# Patient Record
Sex: Male | Born: 1940 | Race: White | Hispanic: No | Marital: Married | Smoking: Former smoker
Health system: Southern US, Community
[De-identification: ages and names within clinical notes are randomized; demographics above are authoritative.]

## PROBLEM LIST (undated history)

## (undated) DIAGNOSIS — J45909 Unspecified asthma, uncomplicated: Secondary | ICD-10-CM

## (undated) DIAGNOSIS — E785 Hyperlipidemia, unspecified: Secondary | ICD-10-CM

## (undated) DIAGNOSIS — I2510 Atherosclerotic heart disease of native coronary artery without angina pectoris: Secondary | ICD-10-CM

## (undated) DIAGNOSIS — I10 Essential (primary) hypertension: Secondary | ICD-10-CM

## (undated) DIAGNOSIS — I4891 Unspecified atrial fibrillation: Secondary | ICD-10-CM

## (undated) HISTORY — DX: Unspecified atrial fibrillation: I48.91

## (undated) HISTORY — PX: OTHER SURGICAL HISTORY: SHX169

## (undated) HISTORY — DX: Hyperlipidemia, unspecified: E78.5

## (undated) HISTORY — PX: CORONARY ARTERY BYPASS GRAFT: SHX141

## (undated) HISTORY — PX: TONSILLECTOMY: SUR1361

## (undated) HISTORY — PX: CYSTOSCOPY: SUR368

## (undated) HISTORY — PX: HERNIA REPAIR: SHX51

---

## 2004-02-06 ENCOUNTER — Other Ambulatory Visit: Payer: Self-pay

## 2004-10-28 ENCOUNTER — Ambulatory Visit: Payer: Self-pay | Admitting: Unknown Physician Specialty

## 2005-08-16 ENCOUNTER — Ambulatory Visit: Payer: Self-pay | Admitting: Internal Medicine

## 2006-02-22 ENCOUNTER — Ambulatory Visit: Payer: Self-pay | Admitting: Unknown Physician Specialty

## 2007-04-11 ENCOUNTER — Ambulatory Visit: Payer: Self-pay | Admitting: Family Medicine

## 2007-04-24 ENCOUNTER — Ambulatory Visit: Payer: Self-pay | Admitting: Internal Medicine

## 2007-07-23 ENCOUNTER — Other Ambulatory Visit: Payer: Self-pay

## 2007-07-23 ENCOUNTER — Emergency Department: Payer: Self-pay | Admitting: Emergency Medicine

## 2007-08-24 ENCOUNTER — Ambulatory Visit: Payer: Self-pay | Admitting: Internal Medicine

## 2007-10-08 ENCOUNTER — Ambulatory Visit: Payer: Self-pay | Admitting: Internal Medicine

## 2007-10-17 ENCOUNTER — Ambulatory Visit: Payer: Self-pay | Admitting: Vascular Surgery

## 2007-10-31 ENCOUNTER — Encounter: Payer: Self-pay | Admitting: Internal Medicine

## 2007-11-11 ENCOUNTER — Encounter: Payer: Self-pay | Admitting: Internal Medicine

## 2007-12-12 ENCOUNTER — Encounter: Payer: Self-pay | Admitting: Internal Medicine

## 2008-10-30 ENCOUNTER — Ambulatory Visit: Payer: Self-pay | Admitting: Internal Medicine

## 2010-10-04 ENCOUNTER — Ambulatory Visit: Payer: Self-pay | Admitting: Internal Medicine

## 2011-05-19 ENCOUNTER — Ambulatory Visit: Payer: Self-pay

## 2011-06-07 ENCOUNTER — Ambulatory Visit: Payer: Self-pay

## 2011-08-02 ENCOUNTER — Ambulatory Visit: Payer: Self-pay | Admitting: Unknown Physician Specialty

## 2011-10-13 DIAGNOSIS — I10 Essential (primary) hypertension: Secondary | ICD-10-CM | POA: Diagnosis not present

## 2011-10-13 DIAGNOSIS — I2510 Atherosclerotic heart disease of native coronary artery without angina pectoris: Secondary | ICD-10-CM | POA: Diagnosis not present

## 2011-10-13 DIAGNOSIS — L989 Disorder of the skin and subcutaneous tissue, unspecified: Secondary | ICD-10-CM | POA: Diagnosis not present

## 2011-10-13 DIAGNOSIS — E785 Hyperlipidemia, unspecified: Secondary | ICD-10-CM | POA: Diagnosis not present

## 2011-10-13 DIAGNOSIS — L738 Other specified follicular disorders: Secondary | ICD-10-CM | POA: Diagnosis not present

## 2011-10-13 DIAGNOSIS — I471 Supraventricular tachycardia: Secondary | ICD-10-CM | POA: Diagnosis not present

## 2011-10-28 DIAGNOSIS — J329 Chronic sinusitis, unspecified: Secondary | ICD-10-CM | POA: Diagnosis not present

## 2011-10-28 DIAGNOSIS — E782 Mixed hyperlipidemia: Secondary | ICD-10-CM | POA: Diagnosis not present

## 2011-10-28 DIAGNOSIS — J3089 Other allergic rhinitis: Secondary | ICD-10-CM | POA: Diagnosis not present

## 2011-10-28 DIAGNOSIS — K219 Gastro-esophageal reflux disease without esophagitis: Secondary | ICD-10-CM | POA: Diagnosis not present

## 2011-10-28 DIAGNOSIS — J45909 Unspecified asthma, uncomplicated: Secondary | ICD-10-CM | POA: Diagnosis not present

## 2012-01-02 DIAGNOSIS — H35379 Puckering of macula, unspecified eye: Secondary | ICD-10-CM | POA: Diagnosis not present

## 2012-01-02 DIAGNOSIS — H43819 Vitreous degeneration, unspecified eye: Secondary | ICD-10-CM | POA: Diagnosis not present

## 2012-01-10 DIAGNOSIS — Z0000 Encounter for general adult medical examination without abnormal findings: Secondary | ICD-10-CM | POA: Diagnosis not present

## 2012-01-10 DIAGNOSIS — Z125 Encounter for screening for malignant neoplasm of prostate: Secondary | ICD-10-CM | POA: Diagnosis not present

## 2012-01-10 DIAGNOSIS — E782 Mixed hyperlipidemia: Secondary | ICD-10-CM | POA: Diagnosis not present

## 2012-01-10 DIAGNOSIS — I10 Essential (primary) hypertension: Secondary | ICD-10-CM | POA: Diagnosis not present

## 2012-01-26 DIAGNOSIS — K219 Gastro-esophageal reflux disease without esophagitis: Secondary | ICD-10-CM | POA: Diagnosis not present

## 2012-01-26 DIAGNOSIS — R5383 Other fatigue: Secondary | ICD-10-CM | POA: Diagnosis not present

## 2012-01-26 DIAGNOSIS — J3089 Other allergic rhinitis: Secondary | ICD-10-CM | POA: Diagnosis not present

## 2012-01-26 DIAGNOSIS — J45901 Unspecified asthma with (acute) exacerbation: Secondary | ICD-10-CM | POA: Diagnosis not present

## 2012-01-26 DIAGNOSIS — J019 Acute sinusitis, unspecified: Secondary | ICD-10-CM | POA: Diagnosis not present

## 2012-01-26 DIAGNOSIS — R5381 Other malaise: Secondary | ICD-10-CM | POA: Diagnosis not present

## 2012-01-26 DIAGNOSIS — J309 Allergic rhinitis, unspecified: Secondary | ICD-10-CM | POA: Diagnosis not present

## 2012-02-23 DIAGNOSIS — K219 Gastro-esophageal reflux disease without esophagitis: Secondary | ICD-10-CM | POA: Diagnosis not present

## 2012-02-23 DIAGNOSIS — J309 Allergic rhinitis, unspecified: Secondary | ICD-10-CM | POA: Diagnosis not present

## 2012-02-23 DIAGNOSIS — E782 Mixed hyperlipidemia: Secondary | ICD-10-CM | POA: Diagnosis not present

## 2012-02-23 DIAGNOSIS — J45909 Unspecified asthma, uncomplicated: Secondary | ICD-10-CM | POA: Diagnosis not present

## 2012-02-23 DIAGNOSIS — I2510 Atherosclerotic heart disease of native coronary artery without angina pectoris: Secondary | ICD-10-CM | POA: Diagnosis not present

## 2012-02-23 DIAGNOSIS — L679 Hair color and hair shaft abnormality, unspecified: Secondary | ICD-10-CM | POA: Diagnosis not present

## 2012-02-28 DIAGNOSIS — L0212 Furuncle of neck: Secondary | ICD-10-CM | POA: Diagnosis not present

## 2012-02-28 DIAGNOSIS — L0202 Furuncle of face: Secondary | ICD-10-CM | POA: Diagnosis not present

## 2012-03-13 DIAGNOSIS — L0212 Furuncle of neck: Secondary | ICD-10-CM | POA: Diagnosis not present

## 2012-03-13 DIAGNOSIS — L0213 Carbuncle of neck: Secondary | ICD-10-CM | POA: Diagnosis not present

## 2012-04-10 DIAGNOSIS — E782 Mixed hyperlipidemia: Secondary | ICD-10-CM | POA: Diagnosis not present

## 2012-04-10 DIAGNOSIS — I119 Hypertensive heart disease without heart failure: Secondary | ICD-10-CM | POA: Diagnosis not present

## 2012-04-10 DIAGNOSIS — I2510 Atherosclerotic heart disease of native coronary artery without angina pectoris: Secondary | ICD-10-CM | POA: Diagnosis not present

## 2012-05-24 DIAGNOSIS — J019 Acute sinusitis, unspecified: Secondary | ICD-10-CM | POA: Diagnosis not present

## 2012-05-24 DIAGNOSIS — J309 Allergic rhinitis, unspecified: Secondary | ICD-10-CM | POA: Diagnosis not present

## 2012-06-04 DIAGNOSIS — Z23 Encounter for immunization: Secondary | ICD-10-CM | POA: Diagnosis not present

## 2012-06-25 DIAGNOSIS — J329 Chronic sinusitis, unspecified: Secondary | ICD-10-CM | POA: Diagnosis not present

## 2012-06-25 DIAGNOSIS — J45909 Unspecified asthma, uncomplicated: Secondary | ICD-10-CM | POA: Diagnosis not present

## 2012-06-25 DIAGNOSIS — J069 Acute upper respiratory infection, unspecified: Secondary | ICD-10-CM | POA: Diagnosis not present

## 2012-06-25 DIAGNOSIS — J309 Allergic rhinitis, unspecified: Secondary | ICD-10-CM | POA: Diagnosis not present

## 2012-07-02 DIAGNOSIS — J019 Acute sinusitis, unspecified: Secondary | ICD-10-CM | POA: Diagnosis not present

## 2012-07-02 DIAGNOSIS — R5381 Other malaise: Secondary | ICD-10-CM | POA: Diagnosis not present

## 2012-07-02 DIAGNOSIS — R42 Dizziness and giddiness: Secondary | ICD-10-CM | POA: Diagnosis not present

## 2012-07-02 DIAGNOSIS — J45909 Unspecified asthma, uncomplicated: Secondary | ICD-10-CM | POA: Diagnosis not present

## 2012-07-02 DIAGNOSIS — K219 Gastro-esophageal reflux disease without esophagitis: Secondary | ICD-10-CM | POA: Diagnosis not present

## 2012-07-02 DIAGNOSIS — D518 Other vitamin B12 deficiency anemias: Secondary | ICD-10-CM | POA: Diagnosis not present

## 2012-07-19 DIAGNOSIS — J45909 Unspecified asthma, uncomplicated: Secondary | ICD-10-CM | POA: Diagnosis not present

## 2012-07-19 DIAGNOSIS — R42 Dizziness and giddiness: Secondary | ICD-10-CM | POA: Diagnosis not present

## 2012-07-19 DIAGNOSIS — I959 Hypotension, unspecified: Secondary | ICD-10-CM | POA: Diagnosis not present

## 2012-07-19 DIAGNOSIS — H6690 Otitis media, unspecified, unspecified ear: Secondary | ICD-10-CM | POA: Diagnosis not present

## 2012-07-19 DIAGNOSIS — J329 Chronic sinusitis, unspecified: Secondary | ICD-10-CM | POA: Diagnosis not present

## 2012-07-25 DIAGNOSIS — H6120 Impacted cerumen, unspecified ear: Secondary | ICD-10-CM | POA: Diagnosis not present

## 2012-07-25 DIAGNOSIS — J301 Allergic rhinitis due to pollen: Secondary | ICD-10-CM | POA: Diagnosis not present

## 2012-07-25 DIAGNOSIS — H93299 Other abnormal auditory perceptions, unspecified ear: Secondary | ICD-10-CM | POA: Diagnosis not present

## 2012-10-22 DIAGNOSIS — K219 Gastro-esophageal reflux disease without esophagitis: Secondary | ICD-10-CM | POA: Diagnosis not present

## 2012-10-22 DIAGNOSIS — I2510 Atherosclerotic heart disease of native coronary artery without angina pectoris: Secondary | ICD-10-CM | POA: Diagnosis not present

## 2012-10-22 DIAGNOSIS — J3089 Other allergic rhinitis: Secondary | ICD-10-CM | POA: Diagnosis not present

## 2012-10-22 DIAGNOSIS — J45991 Cough variant asthma: Secondary | ICD-10-CM | POA: Diagnosis not present

## 2012-10-22 DIAGNOSIS — J329 Chronic sinusitis, unspecified: Secondary | ICD-10-CM | POA: Diagnosis not present

## 2012-11-02 DIAGNOSIS — I9589 Other hypotension: Secondary | ICD-10-CM | POA: Diagnosis not present

## 2012-11-02 DIAGNOSIS — I2510 Atherosclerotic heart disease of native coronary artery without angina pectoris: Secondary | ICD-10-CM | POA: Diagnosis not present

## 2013-02-22 DIAGNOSIS — Z125 Encounter for screening for malignant neoplasm of prostate: Secondary | ICD-10-CM | POA: Diagnosis not present

## 2013-02-22 DIAGNOSIS — E119 Type 2 diabetes mellitus without complications: Secondary | ICD-10-CM | POA: Diagnosis not present

## 2013-02-22 DIAGNOSIS — D518 Other vitamin B12 deficiency anemias: Secondary | ICD-10-CM | POA: Diagnosis not present

## 2013-02-22 DIAGNOSIS — E782 Mixed hyperlipidemia: Secondary | ICD-10-CM | POA: Diagnosis not present

## 2013-02-22 DIAGNOSIS — F329 Major depressive disorder, single episode, unspecified: Secondary | ICD-10-CM | POA: Diagnosis not present

## 2013-02-22 DIAGNOSIS — R634 Abnormal weight loss: Secondary | ICD-10-CM | POA: Diagnosis not present

## 2013-02-22 DIAGNOSIS — E039 Hypothyroidism, unspecified: Secondary | ICD-10-CM | POA: Diagnosis not present

## 2013-02-22 DIAGNOSIS — I10 Essential (primary) hypertension: Secondary | ICD-10-CM | POA: Diagnosis not present

## 2013-02-22 DIAGNOSIS — D643 Other sideroblastic anemias: Secondary | ICD-10-CM | POA: Diagnosis not present

## 2013-02-22 DIAGNOSIS — K219 Gastro-esophageal reflux disease without esophagitis: Secondary | ICD-10-CM | POA: Diagnosis not present

## 2013-02-22 DIAGNOSIS — R5381 Other malaise: Secondary | ICD-10-CM | POA: Diagnosis not present

## 2013-02-22 DIAGNOSIS — J45909 Unspecified asthma, uncomplicated: Secondary | ICD-10-CM | POA: Diagnosis not present

## 2013-04-04 DIAGNOSIS — I2510 Atherosclerotic heart disease of native coronary artery without angina pectoris: Secondary | ICD-10-CM | POA: Diagnosis not present

## 2013-04-04 DIAGNOSIS — J45991 Cough variant asthma: Secondary | ICD-10-CM | POA: Diagnosis not present

## 2013-04-04 DIAGNOSIS — R5381 Other malaise: Secondary | ICD-10-CM | POA: Diagnosis not present

## 2013-04-04 DIAGNOSIS — J3089 Other allergic rhinitis: Secondary | ICD-10-CM | POA: Diagnosis not present

## 2013-04-04 DIAGNOSIS — F329 Major depressive disorder, single episode, unspecified: Secondary | ICD-10-CM | POA: Diagnosis not present

## 2013-04-04 DIAGNOSIS — R634 Abnormal weight loss: Secondary | ICD-10-CM | POA: Diagnosis not present

## 2013-04-04 DIAGNOSIS — K219 Gastro-esophageal reflux disease without esophagitis: Secondary | ICD-10-CM | POA: Diagnosis not present

## 2013-04-25 DIAGNOSIS — I2510 Atherosclerotic heart disease of native coronary artery without angina pectoris: Secondary | ICD-10-CM | POA: Diagnosis not present

## 2013-04-25 DIAGNOSIS — I119 Hypertensive heart disease without heart failure: Secondary | ICD-10-CM | POA: Diagnosis not present

## 2013-04-25 DIAGNOSIS — I714 Abdominal aortic aneurysm, without rupture: Secondary | ICD-10-CM | POA: Diagnosis not present

## 2013-04-25 DIAGNOSIS — I6529 Occlusion and stenosis of unspecified carotid artery: Secondary | ICD-10-CM | POA: Diagnosis not present

## 2013-05-16 DIAGNOSIS — I6529 Occlusion and stenosis of unspecified carotid artery: Secondary | ICD-10-CM | POA: Diagnosis not present

## 2013-05-16 DIAGNOSIS — R0989 Other specified symptoms and signs involving the circulatory and respiratory systems: Secondary | ICD-10-CM | POA: Diagnosis not present

## 2013-05-23 DIAGNOSIS — M25549 Pain in joints of unspecified hand: Secondary | ICD-10-CM | POA: Diagnosis not present

## 2013-05-27 DIAGNOSIS — I2510 Atherosclerotic heart disease of native coronary artery without angina pectoris: Secondary | ICD-10-CM | POA: Diagnosis not present

## 2013-05-27 DIAGNOSIS — I6529 Occlusion and stenosis of unspecified carotid artery: Secondary | ICD-10-CM | POA: Diagnosis not present

## 2013-05-27 DIAGNOSIS — E782 Mixed hyperlipidemia: Secondary | ICD-10-CM | POA: Diagnosis not present

## 2013-05-27 DIAGNOSIS — I471 Supraventricular tachycardia: Secondary | ICD-10-CM | POA: Diagnosis not present

## 2013-06-13 DIAGNOSIS — Z23 Encounter for immunization: Secondary | ICD-10-CM | POA: Diagnosis not present

## 2013-07-10 DIAGNOSIS — L0291 Cutaneous abscess, unspecified: Secondary | ICD-10-CM | POA: Diagnosis not present

## 2013-07-12 DIAGNOSIS — L0291 Cutaneous abscess, unspecified: Secondary | ICD-10-CM | POA: Diagnosis not present

## 2013-07-17 DIAGNOSIS — L0291 Cutaneous abscess, unspecified: Secondary | ICD-10-CM | POA: Diagnosis not present

## 2013-08-06 DIAGNOSIS — R5381 Other malaise: Secondary | ICD-10-CM | POA: Diagnosis not present

## 2013-08-06 DIAGNOSIS — J309 Allergic rhinitis, unspecified: Secondary | ICD-10-CM | POA: Diagnosis not present

## 2013-08-06 DIAGNOSIS — K219 Gastro-esophageal reflux disease without esophagitis: Secondary | ICD-10-CM | POA: Diagnosis not present

## 2013-08-06 DIAGNOSIS — IMO0002 Reserved for concepts with insufficient information to code with codable children: Secondary | ICD-10-CM | POA: Diagnosis not present

## 2013-08-06 DIAGNOSIS — E782 Mixed hyperlipidemia: Secondary | ICD-10-CM | POA: Diagnosis not present

## 2013-08-06 DIAGNOSIS — J45909 Unspecified asthma, uncomplicated: Secondary | ICD-10-CM | POA: Diagnosis not present

## 2013-08-06 DIAGNOSIS — M064 Inflammatory polyarthropathy: Secondary | ICD-10-CM | POA: Diagnosis not present

## 2013-08-06 DIAGNOSIS — M1990 Unspecified osteoarthritis, unspecified site: Secondary | ICD-10-CM | POA: Diagnosis not present

## 2013-08-12 ENCOUNTER — Ambulatory Visit: Payer: Self-pay

## 2013-08-12 DIAGNOSIS — M5030 Other cervical disc degeneration, unspecified cervical region: Secondary | ICD-10-CM | POA: Diagnosis not present

## 2013-08-12 DIAGNOSIS — M47812 Spondylosis without myelopathy or radiculopathy, cervical region: Secondary | ICD-10-CM | POA: Diagnosis not present

## 2013-08-20 DIAGNOSIS — J3089 Other allergic rhinitis: Secondary | ICD-10-CM | POA: Diagnosis not present

## 2013-08-20 DIAGNOSIS — K859 Acute pancreatitis without necrosis or infection, unspecified: Secondary | ICD-10-CM | POA: Diagnosis not present

## 2013-08-20 DIAGNOSIS — J45909 Unspecified asthma, uncomplicated: Secondary | ICD-10-CM | POA: Diagnosis not present

## 2013-08-20 DIAGNOSIS — K219 Gastro-esophageal reflux disease without esophagitis: Secondary | ICD-10-CM | POA: Diagnosis not present

## 2013-08-20 DIAGNOSIS — J45991 Cough variant asthma: Secondary | ICD-10-CM | POA: Diagnosis not present

## 2013-08-20 DIAGNOSIS — I2510 Atherosclerotic heart disease of native coronary artery without angina pectoris: Secondary | ICD-10-CM | POA: Diagnosis not present

## 2013-08-20 DIAGNOSIS — IMO0002 Reserved for concepts with insufficient information to code with codable children: Secondary | ICD-10-CM | POA: Diagnosis not present

## 2013-08-20 DIAGNOSIS — M5020 Other cervical disc displacement, unspecified cervical region: Secondary | ICD-10-CM | POA: Diagnosis not present

## 2013-08-20 DIAGNOSIS — K259 Gastric ulcer, unspecified as acute or chronic, without hemorrhage or perforation: Secondary | ICD-10-CM | POA: Diagnosis not present

## 2013-10-01 DIAGNOSIS — J45909 Unspecified asthma, uncomplicated: Secondary | ICD-10-CM | POA: Diagnosis not present

## 2013-10-01 DIAGNOSIS — K219 Gastro-esophageal reflux disease without esophagitis: Secondary | ICD-10-CM | POA: Diagnosis not present

## 2013-10-01 DIAGNOSIS — J069 Acute upper respiratory infection, unspecified: Secondary | ICD-10-CM | POA: Diagnosis not present

## 2013-10-01 DIAGNOSIS — R5381 Other malaise: Secondary | ICD-10-CM | POA: Diagnosis not present

## 2013-10-01 DIAGNOSIS — J329 Chronic sinusitis, unspecified: Secondary | ICD-10-CM | POA: Diagnosis not present

## 2013-10-01 DIAGNOSIS — J309 Allergic rhinitis, unspecified: Secondary | ICD-10-CM | POA: Diagnosis not present

## 2013-10-01 DIAGNOSIS — I2510 Atherosclerotic heart disease of native coronary artery without angina pectoris: Secondary | ICD-10-CM | POA: Diagnosis not present

## 2013-11-12 DIAGNOSIS — I700 Atherosclerosis of aorta: Secondary | ICD-10-CM | POA: Diagnosis not present

## 2013-11-12 DIAGNOSIS — E785 Hyperlipidemia, unspecified: Secondary | ICD-10-CM | POA: Diagnosis not present

## 2013-11-12 DIAGNOSIS — I2510 Atherosclerotic heart disease of native coronary artery without angina pectoris: Secondary | ICD-10-CM | POA: Diagnosis not present

## 2013-12-18 DIAGNOSIS — K219 Gastro-esophageal reflux disease without esophagitis: Secondary | ICD-10-CM | POA: Diagnosis not present

## 2013-12-18 DIAGNOSIS — J45991 Cough variant asthma: Secondary | ICD-10-CM | POA: Diagnosis not present

## 2013-12-18 DIAGNOSIS — J309 Allergic rhinitis, unspecified: Secondary | ICD-10-CM | POA: Diagnosis not present

## 2013-12-18 DIAGNOSIS — E782 Mixed hyperlipidemia: Secondary | ICD-10-CM | POA: Diagnosis not present

## 2013-12-18 DIAGNOSIS — I2510 Atherosclerotic heart disease of native coronary artery without angina pectoris: Secondary | ICD-10-CM | POA: Diagnosis not present

## 2013-12-18 DIAGNOSIS — I10 Essential (primary) hypertension: Secondary | ICD-10-CM | POA: Diagnosis not present

## 2013-12-18 DIAGNOSIS — L259 Unspecified contact dermatitis, unspecified cause: Secondary | ICD-10-CM | POA: Diagnosis not present

## 2013-12-18 DIAGNOSIS — R0982 Postnasal drip: Secondary | ICD-10-CM | POA: Diagnosis not present

## 2013-12-24 DIAGNOSIS — E782 Mixed hyperlipidemia: Secondary | ICD-10-CM | POA: Diagnosis not present

## 2013-12-24 DIAGNOSIS — I10 Essential (primary) hypertension: Secondary | ICD-10-CM | POA: Diagnosis not present

## 2013-12-24 DIAGNOSIS — R7989 Other specified abnormal findings of blood chemistry: Secondary | ICD-10-CM | POA: Diagnosis not present

## 2014-03-17 DIAGNOSIS — R5381 Other malaise: Secondary | ICD-10-CM | POA: Diagnosis not present

## 2014-03-17 DIAGNOSIS — L0231 Cutaneous abscess of buttock: Secondary | ICD-10-CM | POA: Diagnosis not present

## 2014-03-17 DIAGNOSIS — J45909 Unspecified asthma, uncomplicated: Secondary | ICD-10-CM | POA: Diagnosis not present

## 2014-03-17 DIAGNOSIS — I959 Hypotension, unspecified: Secondary | ICD-10-CM | POA: Diagnosis not present

## 2014-03-17 DIAGNOSIS — L03317 Cellulitis of buttock: Secondary | ICD-10-CM | POA: Diagnosis not present

## 2014-03-19 ENCOUNTER — Encounter: Payer: Self-pay | Admitting: *Deleted

## 2014-03-31 DIAGNOSIS — E039 Hypothyroidism, unspecified: Secondary | ICD-10-CM | POA: Diagnosis not present

## 2014-03-31 DIAGNOSIS — L0231 Cutaneous abscess of buttock: Secondary | ICD-10-CM | POA: Diagnosis not present

## 2014-03-31 DIAGNOSIS — J309 Allergic rhinitis, unspecified: Secondary | ICD-10-CM | POA: Diagnosis not present

## 2014-03-31 DIAGNOSIS — L03317 Cellulitis of buttock: Secondary | ICD-10-CM | POA: Diagnosis not present

## 2014-03-31 DIAGNOSIS — J45909 Unspecified asthma, uncomplicated: Secondary | ICD-10-CM | POA: Diagnosis not present

## 2014-04-07 ENCOUNTER — Ambulatory Visit: Payer: Self-pay | Admitting: General Surgery

## 2014-04-24 DIAGNOSIS — J3089 Other allergic rhinitis: Secondary | ICD-10-CM | POA: Diagnosis not present

## 2014-04-24 DIAGNOSIS — I10 Essential (primary) hypertension: Secondary | ICD-10-CM | POA: Diagnosis not present

## 2014-04-24 DIAGNOSIS — J45909 Unspecified asthma, uncomplicated: Secondary | ICD-10-CM | POA: Diagnosis not present

## 2014-04-24 DIAGNOSIS — K219 Gastro-esophageal reflux disease without esophagitis: Secondary | ICD-10-CM | POA: Diagnosis not present

## 2014-04-24 DIAGNOSIS — M159 Polyosteoarthritis, unspecified: Secondary | ICD-10-CM | POA: Diagnosis not present

## 2014-04-24 DIAGNOSIS — E782 Mixed hyperlipidemia: Secondary | ICD-10-CM | POA: Diagnosis not present

## 2014-04-29 DIAGNOSIS — R238 Other skin changes: Secondary | ICD-10-CM | POA: Diagnosis not present

## 2014-05-06 DIAGNOSIS — R238 Other skin changes: Secondary | ICD-10-CM | POA: Diagnosis not present

## 2014-05-27 DIAGNOSIS — I25810 Atherosclerosis of coronary artery bypass graft(s) without angina pectoris: Secondary | ICD-10-CM | POA: Diagnosis not present

## 2014-05-27 DIAGNOSIS — I10 Essential (primary) hypertension: Secondary | ICD-10-CM | POA: Diagnosis not present

## 2014-05-27 DIAGNOSIS — I739 Peripheral vascular disease, unspecified: Secondary | ICD-10-CM | POA: Diagnosis not present

## 2014-05-27 DIAGNOSIS — E785 Hyperlipidemia, unspecified: Secondary | ICD-10-CM | POA: Diagnosis not present

## 2014-06-06 DIAGNOSIS — I739 Peripheral vascular disease, unspecified: Secondary | ICD-10-CM | POA: Diagnosis not present

## 2014-06-06 DIAGNOSIS — I714 Abdominal aortic aneurysm, without rupture, unspecified: Secondary | ICD-10-CM | POA: Diagnosis not present

## 2014-06-27 DIAGNOSIS — I739 Peripheral vascular disease, unspecified: Secondary | ICD-10-CM | POA: Diagnosis not present

## 2014-06-27 DIAGNOSIS — I10 Essential (primary) hypertension: Secondary | ICD-10-CM | POA: Diagnosis not present

## 2014-06-27 DIAGNOSIS — E782 Mixed hyperlipidemia: Secondary | ICD-10-CM | POA: Diagnosis not present

## 2014-06-27 DIAGNOSIS — I25810 Atherosclerosis of coronary artery bypass graft(s) without angina pectoris: Secondary | ICD-10-CM | POA: Diagnosis not present

## 2014-10-23 DIAGNOSIS — L02432 Carbuncle of left axilla: Secondary | ICD-10-CM | POA: Diagnosis not present

## 2014-10-23 DIAGNOSIS — L309 Dermatitis, unspecified: Secondary | ICD-10-CM | POA: Diagnosis not present

## 2014-10-23 DIAGNOSIS — J309 Allergic rhinitis, unspecified: Secondary | ICD-10-CM | POA: Diagnosis not present

## 2014-12-02 DIAGNOSIS — J0191 Acute recurrent sinusitis, unspecified: Secondary | ICD-10-CM | POA: Diagnosis not present

## 2014-12-24 DIAGNOSIS — I10 Essential (primary) hypertension: Secondary | ICD-10-CM | POA: Diagnosis not present

## 2014-12-24 DIAGNOSIS — E782 Mixed hyperlipidemia: Secondary | ICD-10-CM | POA: Diagnosis not present

## 2014-12-24 DIAGNOSIS — I739 Peripheral vascular disease, unspecified: Secondary | ICD-10-CM | POA: Diagnosis not present

## 2014-12-24 DIAGNOSIS — I25810 Atherosclerosis of coronary artery bypass graft(s) without angina pectoris: Secondary | ICD-10-CM | POA: Diagnosis not present

## 2015-02-23 DIAGNOSIS — R05 Cough: Secondary | ICD-10-CM | POA: Diagnosis not present

## 2015-02-23 DIAGNOSIS — J069 Acute upper respiratory infection, unspecified: Secondary | ICD-10-CM | POA: Diagnosis not present

## 2015-02-23 DIAGNOSIS — J301 Allergic rhinitis due to pollen: Secondary | ICD-10-CM | POA: Diagnosis not present

## 2015-02-23 DIAGNOSIS — R062 Wheezing: Secondary | ICD-10-CM | POA: Diagnosis not present

## 2015-05-08 DIAGNOSIS — J309 Allergic rhinitis, unspecified: Secondary | ICD-10-CM | POA: Diagnosis not present

## 2015-05-08 DIAGNOSIS — R51 Headache: Secondary | ICD-10-CM | POA: Diagnosis not present

## 2015-05-08 DIAGNOSIS — J0191 Acute recurrent sinusitis, unspecified: Secondary | ICD-10-CM | POA: Diagnosis not present

## 2015-05-08 DIAGNOSIS — J4530 Mild persistent asthma, uncomplicated: Secondary | ICD-10-CM | POA: Diagnosis not present

## 2015-06-09 DIAGNOSIS — R42 Dizziness and giddiness: Secondary | ICD-10-CM | POA: Diagnosis not present

## 2015-06-09 DIAGNOSIS — Z0000 Encounter for general adult medical examination without abnormal findings: Secondary | ICD-10-CM | POA: Diagnosis not present

## 2015-06-09 DIAGNOSIS — J301 Allergic rhinitis due to pollen: Secondary | ICD-10-CM | POA: Diagnosis not present

## 2015-06-09 DIAGNOSIS — Z125 Encounter for screening for malignant neoplasm of prostate: Secondary | ICD-10-CM | POA: Diagnosis not present

## 2015-06-09 DIAGNOSIS — E782 Mixed hyperlipidemia: Secondary | ICD-10-CM | POA: Diagnosis not present

## 2015-06-09 DIAGNOSIS — Z1211 Encounter for screening for malignant neoplasm of colon: Secondary | ICD-10-CM | POA: Diagnosis not present

## 2015-06-09 DIAGNOSIS — I959 Hypotension, unspecified: Secondary | ICD-10-CM | POA: Diagnosis not present

## 2015-06-09 DIAGNOSIS — Z0001 Encounter for general adult medical examination with abnormal findings: Secondary | ICD-10-CM | POA: Diagnosis not present

## 2015-06-09 DIAGNOSIS — I2510 Atherosclerotic heart disease of native coronary artery without angina pectoris: Secondary | ICD-10-CM | POA: Diagnosis not present

## 2015-06-11 DIAGNOSIS — R55 Syncope and collapse: Secondary | ICD-10-CM | POA: Diagnosis not present

## 2015-06-17 DIAGNOSIS — I10 Essential (primary) hypertension: Secondary | ICD-10-CM | POA: Insufficient documentation

## 2015-06-24 DIAGNOSIS — R55 Syncope and collapse: Secondary | ICD-10-CM | POA: Diagnosis not present

## 2015-06-25 DIAGNOSIS — I25810 Atherosclerosis of coronary artery bypass graft(s) without angina pectoris: Secondary | ICD-10-CM | POA: Diagnosis not present

## 2015-06-25 DIAGNOSIS — R42 Dizziness and giddiness: Secondary | ICD-10-CM | POA: Diagnosis not present

## 2015-06-25 DIAGNOSIS — E782 Mixed hyperlipidemia: Secondary | ICD-10-CM | POA: Diagnosis not present

## 2015-06-25 DIAGNOSIS — I6523 Occlusion and stenosis of bilateral carotid arteries: Secondary | ICD-10-CM | POA: Insufficient documentation

## 2015-06-29 DIAGNOSIS — I25708 Atherosclerosis of coronary artery bypass graft(s), unspecified, with other forms of angina pectoris: Secondary | ICD-10-CM | POA: Diagnosis not present

## 2015-06-29 DIAGNOSIS — E782 Mixed hyperlipidemia: Secondary | ICD-10-CM | POA: Diagnosis not present

## 2015-06-29 DIAGNOSIS — I6523 Occlusion and stenosis of bilateral carotid arteries: Secondary | ICD-10-CM | POA: Diagnosis not present

## 2015-06-29 DIAGNOSIS — I25810 Atherosclerosis of coronary artery bypass graft(s) without angina pectoris: Secondary | ICD-10-CM | POA: Diagnosis not present

## 2015-06-29 DIAGNOSIS — I10 Essential (primary) hypertension: Secondary | ICD-10-CM | POA: Diagnosis not present

## 2015-06-30 DIAGNOSIS — R42 Dizziness and giddiness: Secondary | ICD-10-CM | POA: Diagnosis not present

## 2015-06-30 DIAGNOSIS — I10 Essential (primary) hypertension: Secondary | ICD-10-CM | POA: Diagnosis not present

## 2015-07-13 DIAGNOSIS — J0191 Acute recurrent sinusitis, unspecified: Secondary | ICD-10-CM | POA: Diagnosis not present

## 2015-07-13 DIAGNOSIS — H6120 Impacted cerumen, unspecified ear: Secondary | ICD-10-CM | POA: Diagnosis not present

## 2015-07-13 DIAGNOSIS — I959 Hypotension, unspecified: Secondary | ICD-10-CM | POA: Diagnosis not present

## 2015-07-13 DIAGNOSIS — I6523 Occlusion and stenosis of bilateral carotid arteries: Secondary | ICD-10-CM | POA: Diagnosis not present

## 2015-07-13 DIAGNOSIS — R42 Dizziness and giddiness: Secondary | ICD-10-CM | POA: Diagnosis not present

## 2015-07-13 DIAGNOSIS — I10 Essential (primary) hypertension: Secondary | ICD-10-CM | POA: Diagnosis not present

## 2015-07-15 ENCOUNTER — Other Ambulatory Visit: Payer: Self-pay | Admitting: Physician Assistant

## 2015-07-15 DIAGNOSIS — I6523 Occlusion and stenosis of bilateral carotid arteries: Secondary | ICD-10-CM

## 2015-07-17 ENCOUNTER — Ambulatory Visit
Admission: RE | Admit: 2015-07-17 | Discharge: 2015-07-17 | Disposition: A | Discharge status: Home / Self Care | Payer: Medicare Other | Source: Ambulatory Visit | Attending: Physician Assistant | Admitting: Physician Assistant

## 2015-07-17 DIAGNOSIS — I6523 Occlusion and stenosis of bilateral carotid arteries: Secondary | ICD-10-CM

## 2015-07-17 DIAGNOSIS — I700 Atherosclerosis of aorta: Secondary | ICD-10-CM | POA: Diagnosis not present

## 2015-07-17 HISTORY — DX: Unspecified asthma, uncomplicated: J45.909

## 2015-07-17 MED ORDER — IOHEXOL 350 MG/ML SOLN
100.0000 mL | Freq: Once | INTRAVENOUS | Status: AC | PRN
  Administered 2015-07-17: 80 mL via INTRAVENOUS

## 2015-08-03 ENCOUNTER — Other Ambulatory Visit: Payer: Self-pay | Admitting: Physician Assistant

## 2015-08-03 ENCOUNTER — Ambulatory Visit
Admission: RE | Admit: 2015-08-03 | Discharge: 2015-08-03 | Disposition: A | Discharge status: Home / Self Care | Payer: Medicare Other | Source: Ambulatory Visit | Attending: Physician Assistant | Admitting: Physician Assistant

## 2015-08-03 DIAGNOSIS — I6523 Occlusion and stenosis of bilateral carotid arteries: Secondary | ICD-10-CM | POA: Diagnosis not present

## 2015-08-03 DIAGNOSIS — R05 Cough: Secondary | ICD-10-CM | POA: Diagnosis not present

## 2015-08-03 DIAGNOSIS — R059 Cough, unspecified: Secondary | ICD-10-CM

## 2015-08-03 DIAGNOSIS — I2510 Atherosclerotic heart disease of native coronary artery without angina pectoris: Secondary | ICD-10-CM | POA: Diagnosis not present

## 2015-08-03 DIAGNOSIS — I10 Essential (primary) hypertension: Secondary | ICD-10-CM | POA: Diagnosis not present

## 2015-08-03 DIAGNOSIS — I499 Cardiac arrhythmia, unspecified: Secondary | ICD-10-CM | POA: Diagnosis not present

## 2015-08-03 DIAGNOSIS — J8410 Pulmonary fibrosis, unspecified: Secondary | ICD-10-CM | POA: Diagnosis not present

## 2015-08-03 DIAGNOSIS — J0191 Acute recurrent sinusitis, unspecified: Secondary | ICD-10-CM | POA: Diagnosis not present

## 2015-08-10 DIAGNOSIS — I25810 Atherosclerosis of coronary artery bypass graft(s) without angina pectoris: Secondary | ICD-10-CM | POA: Diagnosis not present

## 2015-08-10 DIAGNOSIS — I493 Ventricular premature depolarization: Secondary | ICD-10-CM | POA: Diagnosis not present

## 2015-08-10 DIAGNOSIS — E782 Mixed hyperlipidemia: Secondary | ICD-10-CM | POA: Diagnosis not present

## 2015-08-10 DIAGNOSIS — I6523 Occlusion and stenosis of bilateral carotid arteries: Secondary | ICD-10-CM | POA: Diagnosis not present

## 2015-09-03 DIAGNOSIS — J301 Allergic rhinitis due to pollen: Secondary | ICD-10-CM | POA: Diagnosis not present

## 2015-09-03 DIAGNOSIS — J069 Acute upper respiratory infection, unspecified: Secondary | ICD-10-CM | POA: Diagnosis not present

## 2015-09-03 DIAGNOSIS — K219 Gastro-esophageal reflux disease without esophagitis: Secondary | ICD-10-CM | POA: Diagnosis not present

## 2015-09-03 DIAGNOSIS — R05 Cough: Secondary | ICD-10-CM | POA: Diagnosis not present

## 2015-09-03 DIAGNOSIS — R42 Dizziness and giddiness: Secondary | ICD-10-CM | POA: Diagnosis not present

## 2015-09-24 DIAGNOSIS — H31012 Macula scars of posterior pole (postinflammatory) (post-traumatic), left eye: Secondary | ICD-10-CM | POA: Diagnosis not present

## 2015-10-29 DIAGNOSIS — R51 Headache: Secondary | ICD-10-CM | POA: Diagnosis not present

## 2015-10-29 DIAGNOSIS — H6123 Impacted cerumen, bilateral: Secondary | ICD-10-CM | POA: Diagnosis not present

## 2015-10-29 DIAGNOSIS — I10 Essential (primary) hypertension: Secondary | ICD-10-CM | POA: Diagnosis not present

## 2015-10-29 DIAGNOSIS — I2510 Atherosclerotic heart disease of native coronary artery without angina pectoris: Secondary | ICD-10-CM | POA: Diagnosis not present

## 2015-10-29 DIAGNOSIS — J301 Allergic rhinitis due to pollen: Secondary | ICD-10-CM | POA: Diagnosis not present

## 2015-11-11 DIAGNOSIS — J069 Acute upper respiratory infection, unspecified: Secondary | ICD-10-CM | POA: Diagnosis not present

## 2015-11-11 DIAGNOSIS — R0602 Shortness of breath: Secondary | ICD-10-CM | POA: Diagnosis not present

## 2015-11-11 DIAGNOSIS — J301 Allergic rhinitis due to pollen: Secondary | ICD-10-CM | POA: Diagnosis not present

## 2015-11-26 DIAGNOSIS — J8410 Pulmonary fibrosis, unspecified: Secondary | ICD-10-CM | POA: Diagnosis not present

## 2015-11-26 DIAGNOSIS — J301 Allergic rhinitis due to pollen: Secondary | ICD-10-CM | POA: Diagnosis not present

## 2015-11-26 DIAGNOSIS — I10 Essential (primary) hypertension: Secondary | ICD-10-CM | POA: Diagnosis not present

## 2015-11-26 DIAGNOSIS — J0191 Acute recurrent sinusitis, unspecified: Secondary | ICD-10-CM | POA: Diagnosis not present

## 2016-01-01 DIAGNOSIS — I6523 Occlusion and stenosis of bilateral carotid arteries: Secondary | ICD-10-CM | POA: Diagnosis not present

## 2016-01-01 DIAGNOSIS — I10 Essential (primary) hypertension: Secondary | ICD-10-CM | POA: Diagnosis not present

## 2016-01-01 DIAGNOSIS — E782 Mixed hyperlipidemia: Secondary | ICD-10-CM | POA: Diagnosis not present

## 2016-01-01 DIAGNOSIS — I25810 Atherosclerosis of coronary artery bypass graft(s) without angina pectoris: Secondary | ICD-10-CM | POA: Diagnosis not present

## 2016-01-04 DIAGNOSIS — Z961 Presence of intraocular lens: Secondary | ICD-10-CM | POA: Diagnosis not present

## 2016-01-18 DIAGNOSIS — Z961 Presence of intraocular lens: Secondary | ICD-10-CM | POA: Diagnosis not present

## 2016-01-27 IMAGING — CT CT ANGIO NECK
2 of 7 series · 9 of 33 positions shown · IV contrast (APPLIED)
Comparison: Cervical spine radiographs 08/12/2013.

CLINICAL DATA: Carotid occlusion, bilateral. Follow-up ultrasound
from office.

EXAM:
CT ANGIOGRAPHY NECK
TECHNIQUE: Multidetector CT imaging of the neck was performed using the
standard protocol during bolus administration of intravenous
contrast. Multiplanar CT image reconstructions and MIPs were
obtained to evaluate the vascular anatomy. Carotid stenosis
measurements (when applicable) are obtained utilizing NASCET
criteria, using the distal internal carotid diameter as the
denominator.
CONTRAST:  80mL OMNIPAQUE IOHEXOL 350 MG/ML SOLN

[Series 4: cta neck · axial · 0.51mm/px · z∈[-505,-386]mm · 3 of 159 slices shown]
[im 40/159  soft-tissue]
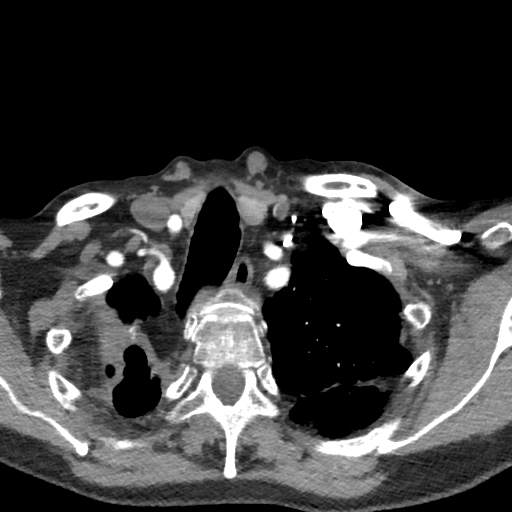
[im 80/159  soft-tissue]
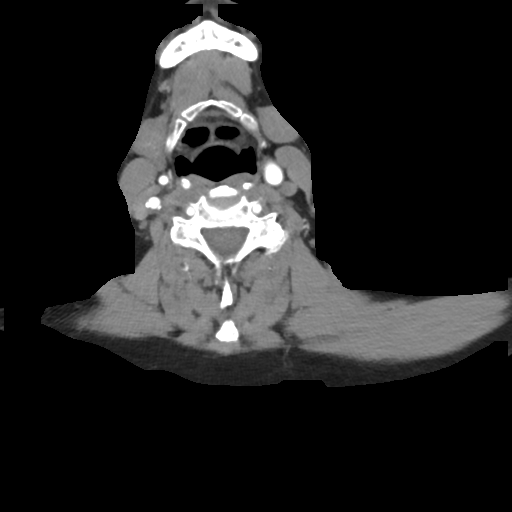
[im 119/159  soft-tissue]
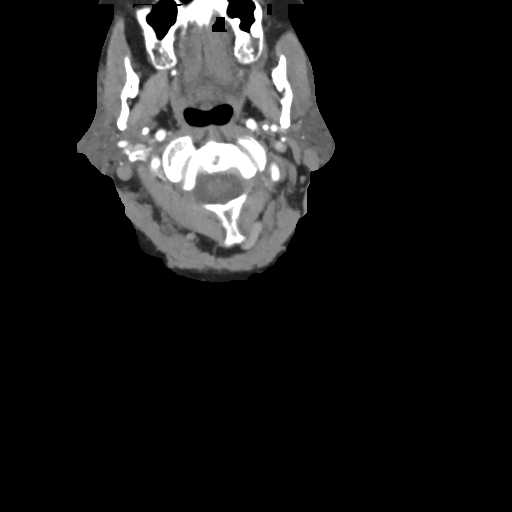

[Series 5: ax thin · axial · 0.39mm/px · z∈[-530,-362]mm · 6 of 236 slices shown]
[im 34/236  soft-tissue]
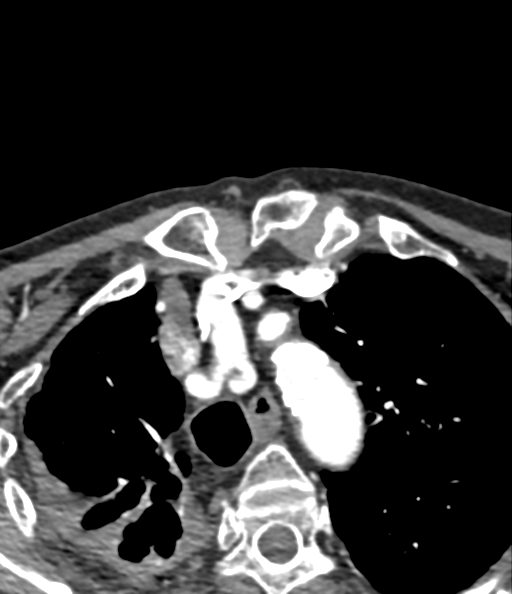
[im 68/236  bone]
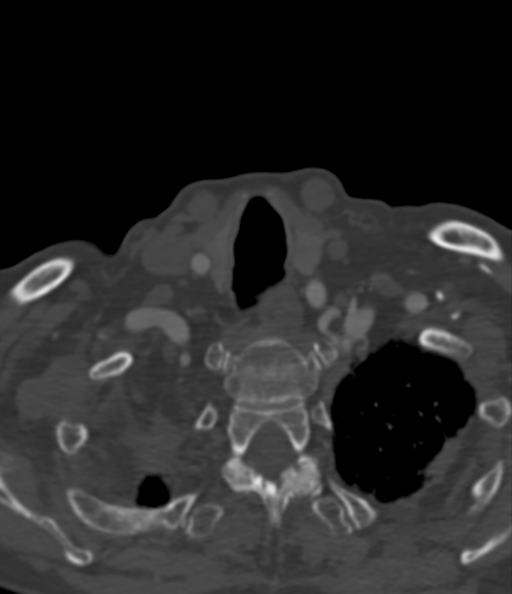
[im 101/236  soft-tissue]
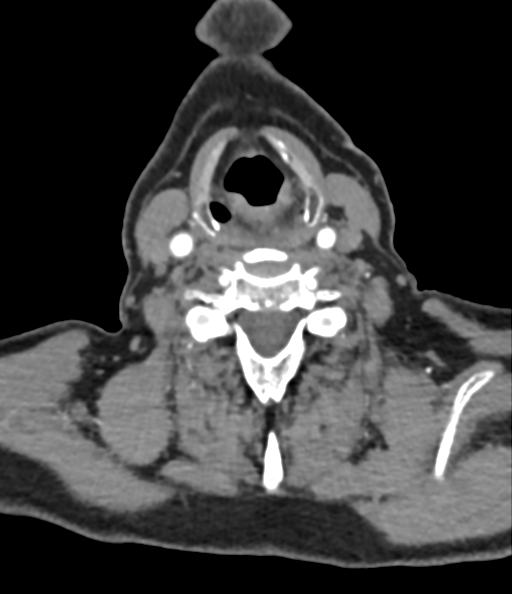
[im 135/236  bone]
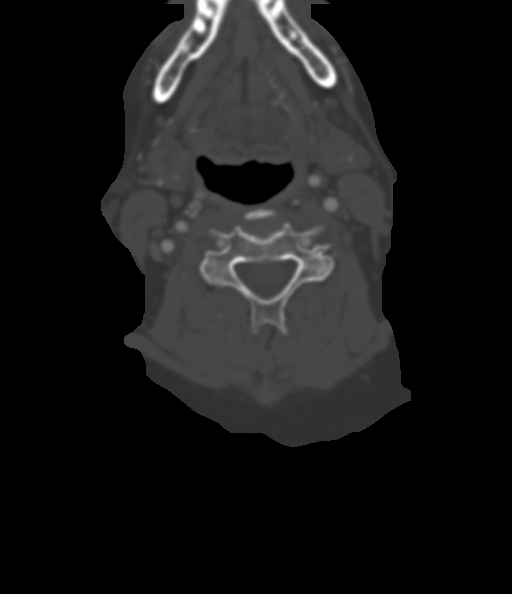
[im 168/236  soft-tissue]
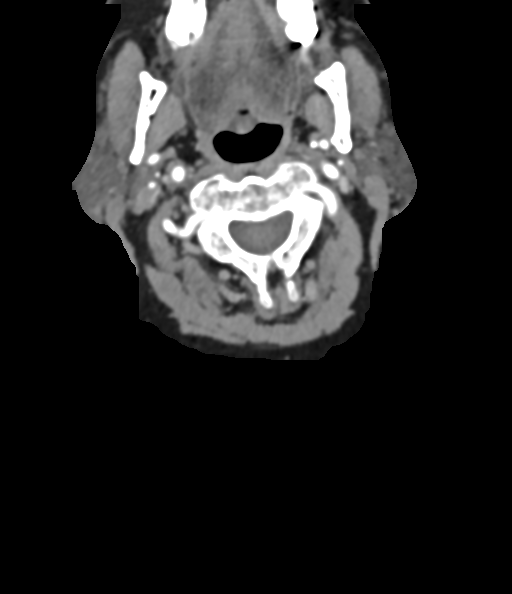
[im 202/236  bone]
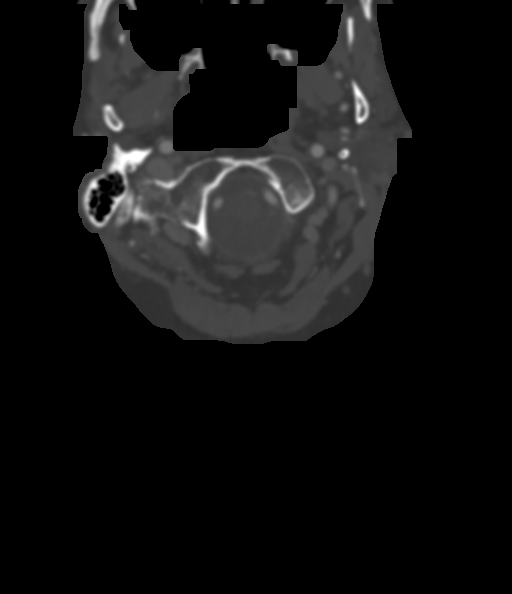

[9 of 33 positions shown; findings below may reference images not displayed]

FINDINGS: Aortic arch: Patient is status post median sternotomy. Mild
atherosclerotic calcifications are present at the origin of great
vessels without significant stenosis. Additional calcifications are
present within the aortic arch without aneurysm.

Right carotid system: The proximal right common carotid artery is
tortuous without significant stenosis. Diffuse posterior
noncalcified plaque is evident along the proximal right internal
carotid artery for approximately 4 cm. There is no significant
stenosis relative to the more distal vessel.

Left carotid system: The left common carotid artery is within normal
limits. The bifurcation is unremarkable. The cervical left ICA is
normal.

Vertebral arteries:The vertebral arteries originate from the
subclavian arteries. There is mild proximal tortuosity without focal
stenosis. The vertebral arteries are within normal limits throughout
the neck. No focal stenosis or vascular injury is evident. The left
vertebral artery is dominant P on the dura.

Skeleton: Mild endplate degenerative changes are present at C3-4.
Vertebral body heights alignment are maintained. No focal lytic or
blastic lesions are present.

Other neck: No focal mucosal or submucosal lesions are present. The
thyroid is within normal limits. Salivary glands are normal. A right
pleural effusion is present. There is scarring at both lung apices.
Bronchiectasis is present the right apex. No discrete mass lesion is
evident.
IMPRESSION: 1. Posterior atherosclerotic plaque along the proximal right
internal carotid artery without a significant stenosis relative to
the more distal vessel.
2. No significant stenosis at the left carotid bifurcation.
3. Tortuosity of the proximal vertebral arteries bilaterally without
significant stenosis.
4. Minimal atherosclerotic calcifications at the aortic arch without
significant stenosis.
5. Biapical pleural scarring, right greater than left. Recommend
follow-up two-view chest x-ray to assure stability of this area.

## 2016-02-03 ENCOUNTER — Ambulatory Visit
Admission: RE | Admit: 2016-02-03 | Discharge: 2016-02-03 | Disposition: A | Discharge status: Home / Self Care | Payer: Medicare Other | Source: Ambulatory Visit | Attending: Nurse Practitioner | Admitting: Nurse Practitioner

## 2016-02-03 ENCOUNTER — Other Ambulatory Visit: Payer: Self-pay | Admitting: Nurse Practitioner

## 2016-02-03 DIAGNOSIS — R059 Cough, unspecified: Secondary | ICD-10-CM

## 2016-02-03 DIAGNOSIS — J42 Unspecified chronic bronchitis: Secondary | ICD-10-CM | POA: Diagnosis not present

## 2016-02-03 DIAGNOSIS — R609 Edema, unspecified: Secondary | ICD-10-CM

## 2016-02-03 DIAGNOSIS — J4530 Mild persistent asthma, uncomplicated: Secondary | ICD-10-CM | POA: Diagnosis not present

## 2016-02-03 DIAGNOSIS — M79605 Pain in left leg: Secondary | ICD-10-CM | POA: Diagnosis present

## 2016-02-03 DIAGNOSIS — R52 Pain, unspecified: Secondary | ICD-10-CM

## 2016-02-03 DIAGNOSIS — M79662 Pain in left lower leg: Secondary | ICD-10-CM | POA: Diagnosis not present

## 2016-02-03 DIAGNOSIS — R05 Cough: Secondary | ICD-10-CM

## 2016-02-03 DIAGNOSIS — S8992XA Unspecified injury of left lower leg, initial encounter: Secondary | ICD-10-CM | POA: Diagnosis not present

## 2016-02-03 DIAGNOSIS — M7989 Other specified soft tissue disorders: Secondary | ICD-10-CM | POA: Diagnosis not present

## 2016-02-29 DIAGNOSIS — M25552 Pain in left hip: Secondary | ICD-10-CM | POA: Diagnosis not present

## 2016-02-29 DIAGNOSIS — I2510 Atherosclerotic heart disease of native coronary artery without angina pectoris: Secondary | ICD-10-CM | POA: Diagnosis not present

## 2016-02-29 DIAGNOSIS — R05 Cough: Secondary | ICD-10-CM | POA: Diagnosis not present

## 2016-02-29 DIAGNOSIS — K219 Gastro-esophageal reflux disease without esophagitis: Secondary | ICD-10-CM | POA: Diagnosis not present

## 2016-02-29 DIAGNOSIS — J8410 Pulmonary fibrosis, unspecified: Secondary | ICD-10-CM | POA: Diagnosis not present

## 2016-02-29 DIAGNOSIS — J301 Allergic rhinitis due to pollen: Secondary | ICD-10-CM | POA: Diagnosis not present

## 2016-03-07 DIAGNOSIS — I952 Hypotension due to drugs: Secondary | ICD-10-CM | POA: Diagnosis not present

## 2016-03-07 DIAGNOSIS — I6523 Occlusion and stenosis of bilateral carotid arteries: Secondary | ICD-10-CM | POA: Diagnosis not present

## 2016-03-07 DIAGNOSIS — I25810 Atherosclerosis of coronary artery bypass graft(s) without angina pectoris: Secondary | ICD-10-CM | POA: Diagnosis not present

## 2016-03-07 DIAGNOSIS — E782 Mixed hyperlipidemia: Secondary | ICD-10-CM | POA: Diagnosis not present

## 2016-06-09 DIAGNOSIS — I10 Essential (primary) hypertension: Secondary | ICD-10-CM | POA: Diagnosis not present

## 2016-06-09 DIAGNOSIS — J301 Allergic rhinitis due to pollen: Secondary | ICD-10-CM | POA: Diagnosis not present

## 2016-06-09 DIAGNOSIS — J4530 Mild persistent asthma, uncomplicated: Secondary | ICD-10-CM | POA: Diagnosis not present

## 2016-06-09 DIAGNOSIS — E782 Mixed hyperlipidemia: Secondary | ICD-10-CM | POA: Diagnosis not present

## 2016-06-09 DIAGNOSIS — D485 Neoplasm of uncertain behavior of skin: Secondary | ICD-10-CM | POA: Diagnosis not present

## 2016-06-09 DIAGNOSIS — J329 Chronic sinusitis, unspecified: Secondary | ICD-10-CM | POA: Diagnosis not present

## 2016-06-09 DIAGNOSIS — Z125 Encounter for screening for malignant neoplasm of prostate: Secondary | ICD-10-CM | POA: Diagnosis not present

## 2016-06-09 DIAGNOSIS — Z0001 Encounter for general adult medical examination with abnormal findings: Secondary | ICD-10-CM | POA: Diagnosis not present

## 2016-06-09 DIAGNOSIS — Z23 Encounter for immunization: Secondary | ICD-10-CM | POA: Diagnosis not present

## 2016-06-14 ENCOUNTER — Other Ambulatory Visit: Payer: Self-pay | Admitting: Nurse Practitioner

## 2016-06-14 DIAGNOSIS — J329 Chronic sinusitis, unspecified: Secondary | ICD-10-CM

## 2016-06-15 DIAGNOSIS — L570 Actinic keratosis: Secondary | ICD-10-CM | POA: Diagnosis not present

## 2016-06-15 DIAGNOSIS — X32XXXA Exposure to sunlight, initial encounter: Secondary | ICD-10-CM | POA: Diagnosis not present

## 2016-06-15 DIAGNOSIS — L218 Other seborrheic dermatitis: Secondary | ICD-10-CM | POA: Diagnosis not present

## 2016-06-15 DIAGNOSIS — L538 Other specified erythematous conditions: Secondary | ICD-10-CM | POA: Diagnosis not present

## 2016-06-15 DIAGNOSIS — L821 Other seborrheic keratosis: Secondary | ICD-10-CM | POA: Diagnosis not present

## 2016-06-15 DIAGNOSIS — B078 Other viral warts: Secondary | ICD-10-CM | POA: Diagnosis not present

## 2016-06-16 ENCOUNTER — Ambulatory Visit
Admission: RE | Admit: 2016-06-16 | Discharge: 2016-06-16 | Disposition: A | Discharge status: Home / Self Care | Payer: Medicare Other | Source: Ambulatory Visit | Attending: Nurse Practitioner | Admitting: Nurse Practitioner

## 2016-06-16 DIAGNOSIS — J329 Chronic sinusitis, unspecified: Secondary | ICD-10-CM | POA: Insufficient documentation

## 2016-06-16 DIAGNOSIS — R51 Headache: Secondary | ICD-10-CM | POA: Diagnosis not present

## 2016-06-20 DIAGNOSIS — Z961 Presence of intraocular lens: Secondary | ICD-10-CM | POA: Diagnosis not present

## 2016-09-08 DIAGNOSIS — J4530 Mild persistent asthma, uncomplicated: Secondary | ICD-10-CM | POA: Diagnosis not present

## 2016-09-08 DIAGNOSIS — I10 Essential (primary) hypertension: Secondary | ICD-10-CM | POA: Diagnosis not present

## 2016-09-08 DIAGNOSIS — R05 Cough: Secondary | ICD-10-CM | POA: Diagnosis not present

## 2016-09-08 DIAGNOSIS — E782 Mixed hyperlipidemia: Secondary | ICD-10-CM | POA: Diagnosis not present

## 2016-09-08 DIAGNOSIS — I25810 Atherosclerosis of coronary artery bypass graft(s) without angina pectoris: Secondary | ICD-10-CM | POA: Diagnosis not present

## 2016-09-08 DIAGNOSIS — J301 Allergic rhinitis due to pollen: Secondary | ICD-10-CM | POA: Diagnosis not present

## 2016-09-08 DIAGNOSIS — J0191 Acute recurrent sinusitis, unspecified: Secondary | ICD-10-CM | POA: Diagnosis not present

## 2016-10-17 ENCOUNTER — Ambulatory Visit
Admission: RE | Admit: 2016-10-17 | Discharge: 2016-10-17 | Disposition: A | Discharge status: Home / Self Care | Payer: Medicare Other | Source: Ambulatory Visit | Attending: Nurse Practitioner | Admitting: Nurse Practitioner

## 2016-10-17 ENCOUNTER — Other Ambulatory Visit: Payer: Self-pay | Admitting: Nurse Practitioner

## 2016-10-17 DIAGNOSIS — Z951 Presence of aortocoronary bypass graft: Secondary | ICD-10-CM | POA: Insufficient documentation

## 2016-10-17 DIAGNOSIS — R05 Cough: Secondary | ICD-10-CM

## 2016-10-17 DIAGNOSIS — K219 Gastro-esophageal reflux disease without esophagitis: Secondary | ICD-10-CM | POA: Diagnosis not present

## 2016-10-17 DIAGNOSIS — J984 Other disorders of lung: Secondary | ICD-10-CM | POA: Diagnosis not present

## 2016-10-17 DIAGNOSIS — J309 Allergic rhinitis, unspecified: Secondary | ICD-10-CM | POA: Diagnosis not present

## 2016-10-17 DIAGNOSIS — R059 Cough, unspecified: Secondary | ICD-10-CM

## 2016-10-17 DIAGNOSIS — J45991 Cough variant asthma: Secondary | ICD-10-CM | POA: Diagnosis not present

## 2016-11-15 DIAGNOSIS — J0191 Acute recurrent sinusitis, unspecified: Secondary | ICD-10-CM | POA: Diagnosis not present

## 2016-11-15 DIAGNOSIS — J309 Allergic rhinitis, unspecified: Secondary | ICD-10-CM | POA: Diagnosis not present

## 2016-11-15 DIAGNOSIS — J4530 Mild persistent asthma, uncomplicated: Secondary | ICD-10-CM | POA: Diagnosis not present

## 2016-12-27 DIAGNOSIS — J8410 Pulmonary fibrosis, unspecified: Secondary | ICD-10-CM | POA: Diagnosis not present

## 2016-12-27 DIAGNOSIS — J441 Chronic obstructive pulmonary disease with (acute) exacerbation: Secondary | ICD-10-CM | POA: Diagnosis not present

## 2016-12-27 DIAGNOSIS — J0191 Acute recurrent sinusitis, unspecified: Secondary | ICD-10-CM | POA: Diagnosis not present

## 2016-12-28 DIAGNOSIS — R0602 Shortness of breath: Secondary | ICD-10-CM | POA: Diagnosis not present

## 2017-02-14 DIAGNOSIS — J329 Chronic sinusitis, unspecified: Secondary | ICD-10-CM | POA: Diagnosis not present

## 2017-02-14 DIAGNOSIS — I10 Essential (primary) hypertension: Secondary | ICD-10-CM | POA: Diagnosis not present

## 2017-02-14 DIAGNOSIS — R05 Cough: Secondary | ICD-10-CM | POA: Diagnosis not present

## 2017-02-14 DIAGNOSIS — J4530 Mild persistent asthma, uncomplicated: Secondary | ICD-10-CM | POA: Diagnosis not present

## 2017-02-20 DIAGNOSIS — I739 Peripheral vascular disease, unspecified: Secondary | ICD-10-CM | POA: Diagnosis not present

## 2017-02-20 DIAGNOSIS — I10 Essential (primary) hypertension: Secondary | ICD-10-CM | POA: Diagnosis not present

## 2017-02-20 DIAGNOSIS — I25810 Atherosclerosis of coronary artery bypass graft(s) without angina pectoris: Secondary | ICD-10-CM | POA: Diagnosis not present

## 2017-02-20 DIAGNOSIS — E782 Mixed hyperlipidemia: Secondary | ICD-10-CM | POA: Diagnosis not present

## 2017-03-09 DIAGNOSIS — Z961 Presence of intraocular lens: Secondary | ICD-10-CM | POA: Diagnosis not present

## 2017-03-31 ENCOUNTER — Other Ambulatory Visit: Payer: Self-pay

## 2017-04-03 DIAGNOSIS — Z961 Presence of intraocular lens: Secondary | ICD-10-CM | POA: Diagnosis not present

## 2017-04-03 DIAGNOSIS — Z8669 Personal history of other diseases of the nervous system and sense organs: Secondary | ICD-10-CM | POA: Diagnosis not present

## 2017-04-03 DIAGNOSIS — H278 Other specified disorders of lens: Secondary | ICD-10-CM | POA: Diagnosis not present

## 2017-04-19 DIAGNOSIS — Z8669 Personal history of other diseases of the nervous system and sense organs: Secondary | ICD-10-CM | POA: Diagnosis not present

## 2017-04-19 DIAGNOSIS — Z961 Presence of intraocular lens: Secondary | ICD-10-CM | POA: Diagnosis not present

## 2017-04-19 DIAGNOSIS — H4423 Degenerative myopia, bilateral: Secondary | ICD-10-CM | POA: Diagnosis not present

## 2017-04-19 DIAGNOSIS — H31092 Other chorioretinal scars, left eye: Secondary | ICD-10-CM | POA: Diagnosis not present

## 2017-04-19 DIAGNOSIS — H278 Other specified disorders of lens: Secondary | ICD-10-CM | POA: Diagnosis not present

## 2017-05-16 DIAGNOSIS — K219 Gastro-esophageal reflux disease without esophagitis: Secondary | ICD-10-CM | POA: Diagnosis not present

## 2017-05-16 DIAGNOSIS — J45909 Unspecified asthma, uncomplicated: Secondary | ICD-10-CM | POA: Diagnosis not present

## 2017-05-16 DIAGNOSIS — Z87891 Personal history of nicotine dependence: Secondary | ICD-10-CM | POA: Diagnosis not present

## 2017-05-16 DIAGNOSIS — Z7951 Long term (current) use of inhaled steroids: Secondary | ICD-10-CM | POA: Diagnosis not present

## 2017-05-16 DIAGNOSIS — Z79899 Other long term (current) drug therapy: Secondary | ICD-10-CM | POA: Diagnosis not present

## 2017-05-16 DIAGNOSIS — T8522XA Displacement of intraocular lens, initial encounter: Secondary | ICD-10-CM | POA: Diagnosis not present

## 2017-05-16 DIAGNOSIS — Z8669 Personal history of other diseases of the nervous system and sense organs: Secondary | ICD-10-CM | POA: Diagnosis not present

## 2017-05-16 DIAGNOSIS — Z7982 Long term (current) use of aspirin: Secondary | ICD-10-CM | POA: Diagnosis not present

## 2017-05-16 DIAGNOSIS — Z951 Presence of aortocoronary bypass graft: Secondary | ICD-10-CM | POA: Diagnosis not present

## 2017-05-16 DIAGNOSIS — H27131 Posterior dislocation of lens, right eye: Secondary | ICD-10-CM | POA: Diagnosis not present

## 2017-05-16 DIAGNOSIS — I10 Essential (primary) hypertension: Secondary | ICD-10-CM | POA: Diagnosis not present

## 2017-05-16 DIAGNOSIS — I2510 Atherosclerotic heart disease of native coronary artery without angina pectoris: Secondary | ICD-10-CM | POA: Diagnosis not present

## 2017-05-16 HISTORY — PX: OTHER SURGICAL HISTORY: SHX169

## 2017-06-14 DIAGNOSIS — R238 Other skin changes: Secondary | ICD-10-CM | POA: Diagnosis not present

## 2017-06-14 DIAGNOSIS — L853 Xerosis cutis: Secondary | ICD-10-CM | POA: Diagnosis not present

## 2017-06-14 DIAGNOSIS — L570 Actinic keratosis: Secondary | ICD-10-CM | POA: Diagnosis not present

## 2017-06-14 DIAGNOSIS — B078 Other viral warts: Secondary | ICD-10-CM | POA: Diagnosis not present

## 2017-06-14 DIAGNOSIS — L0889 Other specified local infections of the skin and subcutaneous tissue: Secondary | ICD-10-CM | POA: Diagnosis not present

## 2017-06-19 DIAGNOSIS — Z23 Encounter for immunization: Secondary | ICD-10-CM | POA: Diagnosis not present

## 2017-06-19 DIAGNOSIS — R1310 Dysphagia, unspecified: Secondary | ICD-10-CM | POA: Diagnosis not present

## 2017-06-19 DIAGNOSIS — K219 Gastro-esophageal reflux disease without esophagitis: Secondary | ICD-10-CM | POA: Diagnosis not present

## 2017-06-19 DIAGNOSIS — J309 Allergic rhinitis, unspecified: Secondary | ICD-10-CM | POA: Diagnosis not present

## 2017-06-19 DIAGNOSIS — I10 Essential (primary) hypertension: Secondary | ICD-10-CM | POA: Diagnosis not present

## 2017-06-19 DIAGNOSIS — J4530 Mild persistent asthma, uncomplicated: Secondary | ICD-10-CM | POA: Diagnosis not present

## 2017-06-19 DIAGNOSIS — Z0001 Encounter for general adult medical examination with abnormal findings: Secondary | ICD-10-CM | POA: Diagnosis not present

## 2017-06-22 DIAGNOSIS — Z125 Encounter for screening for malignant neoplasm of prostate: Secondary | ICD-10-CM | POA: Diagnosis not present

## 2017-06-22 DIAGNOSIS — Z0001 Encounter for general adult medical examination with abnormal findings: Secondary | ICD-10-CM | POA: Diagnosis not present

## 2017-06-22 DIAGNOSIS — I10 Essential (primary) hypertension: Secondary | ICD-10-CM | POA: Diagnosis not present

## 2017-06-28 DIAGNOSIS — H35371 Puckering of macula, right eye: Secondary | ICD-10-CM | POA: Diagnosis not present

## 2017-07-20 ENCOUNTER — Ambulatory Visit
Admission: RE | Admit: 2017-07-20 | Discharge: 2017-07-20 | Disposition: A | Discharge status: Home / Self Care | Payer: Medicare Other | Source: Ambulatory Visit | Attending: Nurse Practitioner | Admitting: Nurse Practitioner

## 2017-07-20 ENCOUNTER — Other Ambulatory Visit: Payer: Self-pay | Admitting: Nurse Practitioner

## 2017-07-20 DIAGNOSIS — J45991 Cough variant asthma: Secondary | ICD-10-CM | POA: Diagnosis not present

## 2017-07-20 DIAGNOSIS — J309 Allergic rhinitis, unspecified: Secondary | ICD-10-CM | POA: Diagnosis not present

## 2017-07-20 DIAGNOSIS — M25511 Pain in right shoulder: Secondary | ICD-10-CM | POA: Diagnosis not present

## 2017-07-20 DIAGNOSIS — M7541 Impingement syndrome of right shoulder: Secondary | ICD-10-CM | POA: Diagnosis not present

## 2017-07-20 DIAGNOSIS — I10 Essential (primary) hypertension: Secondary | ICD-10-CM | POA: Diagnosis not present

## 2017-07-20 DIAGNOSIS — G2581 Restless legs syndrome: Secondary | ICD-10-CM | POA: Diagnosis not present

## 2017-08-02 DIAGNOSIS — Z961 Presence of intraocular lens: Secondary | ICD-10-CM | POA: Diagnosis not present

## 2017-08-10 DIAGNOSIS — H2701 Aphakia, right eye: Secondary | ICD-10-CM | POA: Diagnosis not present

## 2017-08-16 DIAGNOSIS — R05 Cough: Secondary | ICD-10-CM | POA: Diagnosis not present

## 2017-08-16 DIAGNOSIS — J329 Chronic sinusitis, unspecified: Secondary | ICD-10-CM | POA: Diagnosis not present

## 2017-08-16 DIAGNOSIS — I10 Essential (primary) hypertension: Secondary | ICD-10-CM | POA: Diagnosis not present

## 2017-08-16 DIAGNOSIS — J4530 Mild persistent asthma, uncomplicated: Secondary | ICD-10-CM | POA: Diagnosis not present

## 2017-08-17 DIAGNOSIS — Z961 Presence of intraocular lens: Secondary | ICD-10-CM | POA: Diagnosis not present

## 2017-08-22 DIAGNOSIS — I739 Peripheral vascular disease, unspecified: Secondary | ICD-10-CM | POA: Diagnosis not present

## 2017-08-22 DIAGNOSIS — I6523 Occlusion and stenosis of bilateral carotid arteries: Secondary | ICD-10-CM | POA: Diagnosis not present

## 2017-08-22 DIAGNOSIS — Z01818 Encounter for other preprocedural examination: Secondary | ICD-10-CM | POA: Diagnosis not present

## 2017-08-22 DIAGNOSIS — R0609 Other forms of dyspnea: Secondary | ICD-10-CM | POA: Diagnosis not present

## 2017-08-22 DIAGNOSIS — E782 Mixed hyperlipidemia: Secondary | ICD-10-CM | POA: Diagnosis not present

## 2017-08-22 DIAGNOSIS — I10 Essential (primary) hypertension: Secondary | ICD-10-CM | POA: Diagnosis not present

## 2017-08-22 DIAGNOSIS — I25810 Atherosclerosis of coronary artery bypass graft(s) without angina pectoris: Secondary | ICD-10-CM | POA: Diagnosis not present

## 2017-08-22 DIAGNOSIS — I493 Ventricular premature depolarization: Secondary | ICD-10-CM | POA: Diagnosis not present

## 2017-08-23 DIAGNOSIS — H2701 Aphakia, right eye: Secondary | ICD-10-CM | POA: Diagnosis not present

## 2017-09-03 ENCOUNTER — Other Ambulatory Visit: Payer: Self-pay | Admitting: Internal Medicine

## 2017-09-04 ENCOUNTER — Other Ambulatory Visit: Payer: Self-pay | Admitting: Internal Medicine

## 2017-09-08 DIAGNOSIS — R0609 Other forms of dyspnea: Secondary | ICD-10-CM | POA: Diagnosis not present

## 2017-09-13 DIAGNOSIS — I6523 Occlusion and stenosis of bilateral carotid arteries: Secondary | ICD-10-CM | POA: Diagnosis not present

## 2017-09-13 DIAGNOSIS — I10 Essential (primary) hypertension: Secondary | ICD-10-CM | POA: Diagnosis not present

## 2017-09-13 DIAGNOSIS — I25810 Atherosclerosis of coronary artery bypass graft(s) without angina pectoris: Secondary | ICD-10-CM | POA: Diagnosis not present

## 2017-09-21 ENCOUNTER — Other Ambulatory Visit: Payer: Self-pay | Admitting: Internal Medicine

## 2017-09-28 ENCOUNTER — Ambulatory Visit (INDEPENDENT_AMBULATORY_CARE_PROVIDER_SITE_OTHER): Payer: Medicare Other | Admitting: Nurse Practitioner

## 2017-09-28 ENCOUNTER — Encounter: Payer: Self-pay | Admitting: Nurse Practitioner

## 2017-09-28 VITALS — BP 120/80 | HR 74 | Resp 16 | Ht 68.0 in | Wt 192.0 lb

## 2017-09-28 DIAGNOSIS — I10 Essential (primary) hypertension: Secondary | ICD-10-CM | POA: Diagnosis not present

## 2017-09-28 DIAGNOSIS — J0111 Acute recurrent frontal sinusitis: Secondary | ICD-10-CM | POA: Diagnosis not present

## 2017-09-28 DIAGNOSIS — R05 Cough: Secondary | ICD-10-CM | POA: Diagnosis not present

## 2017-09-28 DIAGNOSIS — J3089 Other allergic rhinitis: Secondary | ICD-10-CM

## 2017-09-28 DIAGNOSIS — R059 Cough, unspecified: Secondary | ICD-10-CM

## 2017-09-28 DIAGNOSIS — E782 Mixed hyperlipidemia: Secondary | ICD-10-CM | POA: Insufficient documentation

## 2017-09-28 DIAGNOSIS — I2510 Atherosclerotic heart disease of native coronary artery without angina pectoris: Secondary | ICD-10-CM | POA: Insufficient documentation

## 2017-09-28 MED ORDER — LEVOFLOXACIN 500 MG PO TABS: 500.0000 mg | ORAL_TABLET | Freq: Every day | ORAL | 0 refills | Status: DC

## 2017-09-28 MED ORDER — PROMETHAZINE-CODEINE 6.25-10 MG/5ML PO SYRP: 5.0000 mL | ORAL_SOLUTION | Freq: Four times a day (QID) | ORAL | 0 refills | Status: DC | PRN

## 2017-09-28 NOTE — Progress Notes (Signed)
[facility], [place] [address]  Internal MEDICINE  Office Visit Note  Patient Name: James Mathews  [id number]  [id number]  Date of Service: 09/28/2017  Chief Complaint  Patient presents with  . Sinusitis    URI   This is a recurrent problem. The current episode started more than 1 month ago. The problem has been gradually improving. There has been no fever. The fever has been present for 5 days or more. Associated symptoms include congestion, coughing, headaches, rhinorrhea, sinus pain, sneezing and wheezing. Pertinent negatives include no abdominal pain, chest pain, diarrhea, dysuria, nausea, neck pain, rash, sore throat or vomiting. He has tried decongestant, NSAIDs and inhaler use for the symptoms. The treatment provided mild relief.    Pt is here for routine follow up.    Current Medication: Outpatient Encounter Medications as of 09/28/2017  Medication Sig  . aspirin EC 81 MG tablet Take by mouth.  . cholecalciferol (VITAMIN D) 1000 units tablet Take 1,000 Units by mouth daily.  Marland Kitchen desloratadine (CLARINEX) 5 MG tablet TAKE 1 TABLET BY MOUTH DAILY FOR ALLERGIC RHINITIS  . fluticasone (FLONASE) 50 MCG/ACT nasal spray Place into the nose.  Marland Kitchen lisinopril (PRINIVIL,ZESTRIL) 5 MG tablet Take 5 mg by mouth daily.  . meloxicam (MOBIC) 7.5 MG tablet Take by mouth.  . montelukast (SINGULAIR) 10 MG tablet TAKE 1 TABLET BY MOUTH DAILY FOR CHRONIC ALLERGIC RHINITIS/ ASTHMA  . Omega-3 Fatty Acids (FISH OIL PO) Take by mouth.  Marland Kitchen omeprazole (PRILOSEC) 20 MG capsule Take by mouth.  . simvastatin (ZOCOR) 40 MG tablet TAKE 1 TABLET BY MOUTH EVERY NIGHT AT BEDTIME  . vitamin C (ASCORBIC ACID) 500 MG tablet Take 500 mg by mouth daily.  . vitamin E 400 UNIT capsule Take 400 Units by mouth daily.  Marland Kitchen levofloxacin (LEVAQUIN) 500 MG tablet Take 1 tablet (500 mg total) by mouth daily.  . promethazine-codeine (PHENERGAN WITH CODEINE) 6.25-10 MG/5ML syrup Take 5 mLs by  mouth every 6 (six) hours as needed for cough.   No facility-administered encounter medications on file as of 09/28/2017.     Surgical History: Past Surgical History:  Procedure Laterality Date  . cartaract surgery both eye Bilateral   . CORONARY ARTERY BYPASS GRAFT Bilateral   . CYSTOSCOPY    . HERNIA REPAIR    . knee replacment Bilateral   . retina surgery on right eye  Right 05/16/2017  . TONSILLECTOMY Bilateral     Medical History: Past Medical History:  Diagnosis Date  . Asthma     Family History: No family history on file.  Social History   Socioeconomic History  . Marital status: Married    Spouse name: Not on file  . Number of children: Not on file  . Years of education: Not on file  . Highest education level: Not on file  Social Needs  . Financial resource strain: Not on file  . Food insecurity - worry: Not on file  . Food insecurity - inability: Not on file  . Transportation needs - medical: Not on file  . Transportation needs - non-medical: Not on file  Occupational History  . Not on file  Tobacco Use  . Smoking status: Never Smoker  . Smokeless tobacco: Never Used  Substance and Sexual Activity  . Alcohol use: No    Frequency: Never  . Drug use: No  . Sexual activity: Not on file  Other Topics Concern  . Not on file  Social History  Narrative  . Not on file      Review of Systems  Constitutional: Positive for fatigue. Negative for chills, fever and unexpected weight change.  HENT: Positive for congestion, postnasal drip, rhinorrhea, sinus pain and sneezing. Negative for sore throat.   Eyes: Negative.  Negative for redness.  Respiratory: Positive for cough and wheezing. Negative for chest tightness and shortness of breath.   Cardiovascular: Negative for chest pain and palpitations.  Gastrointestinal: Negative for abdominal pain, constipation, diarrhea, nausea and vomiting.  Endocrine: Negative for cold intolerance, heat intolerance,  polydipsia, polyphagia and polyuria.  Genitourinary: Negative for dysuria and frequency.  Musculoskeletal: Negative.  Negative for arthralgias, back pain, joint swelling and neck pain.  Skin: Negative for color change, pallor, rash and wound.  Allergic/Immunologic: Positive for environmental allergies and food allergies.  Neurological: Positive for headaches. Negative for tremors, weakness and numbness.  Hematological: Negative.  Negative for adenopathy. Does not bruise/bleed easily.  Psychiatric/Behavioral: Negative for behavioral problems (Depression), sleep disturbance and suicidal ideas. The patient is not nervous/anxious.    Today's Vitals   09/28/17 1419  BP: 120/80  Pulse: 74  Resp: 16  SpO2: 98%  Weight: 192 lb (87.1 kg)  Height: 5\' 8"  (1.727 m)    Physical Exam  Constitutional: He is oriented to person, place, and time. He appears well-developed and well-nourished. No distress.  HENT:  Head: Normocephalic and atraumatic.  Right Ear: Tympanic membrane, external ear and ear canal normal.  Left Ear: Tympanic membrane, external ear and ear canal normal.  Nose: Rhinorrhea present. Right sinus exhibits frontal sinus tenderness. Right sinus exhibits no maxillary sinus tenderness. Left sinus exhibits frontal sinus tenderness. Left sinus exhibits no maxillary sinus tenderness.  Mouth/Throat: Mucous membranes are normal. Posterior oropharyngeal erythema present. No oropharyngeal exudate.  Eyes: EOM are normal. Pupils are equal, round, and reactive to light.  Neck: Normal range of motion. Neck supple. No JVD present. No tracheal deviation present. No thyromegaly present.  Cardiovascular: Normal rate, regular rhythm and normal heart sounds. Exam reveals no gallop and no friction rub.  No murmur heard. Pulmonary/Chest: Effort normal. No respiratory distress. He has no wheezes. He has no rales. He exhibits no tenderness.  Abdominal: Soft. Bowel sounds are normal.  Musculoskeletal: Normal  range of motion.  Lymphadenopathy:    He has no cervical adenopathy.  Neurological: He is alert and oriented to person, place, and time. No cranial nerve deficit.  Skin: Skin is warm and dry. He is not diaphoretic.  Psychiatric: He has a normal mood and affect. His behavior is normal. Judgment and thought content normal.  Nursing note and vitals reviewed.   Assessment/Plan: 1. Acute recurrent frontal sinusitis - levofloxacin (LEVAQUIN) 500 MG tablet; Take 1 tablet (500 mg total) by mouth daily.  Dispense: 10 tablet; Refill: 0 Recommend use of mucinex DM to help with symptom relief.   2. Cough - promethazine-codeine (PHENERGAN WITH CODEINE) 6.25-10 MG/5ML syrup; Take 5 mLs by mouth every 6 (six) hours as needed for cough.  Dispense: 180 mL; Refill: 0  3. Benign essential hypertension Continue to take lisinopril as needed and as prescribed. Patient monitoring bp closely at home.   4. Allergic rhinitis due to other allergic trigger, unspecified seasonality Use nasal spray every day. Take oral allergy medications as prescribed.   General Counseling: tu bayle understanding of the findings of todays visit and agrees with plan of treatment. I have discussed any further diagnostic evaluation that may be needed or ordered today. We also reviewed  his medications today. he has been encouraged to call the office with any questions or concerns that should arise related to todays visit.  This patient was seen by Leretha Pol, FNP- C in Collaboration with Dr Lavera Guise as a part of collaborative care agreement       Time spent: 70 Minutes    Dr Lavera Guise Internal medicine

## 2017-10-09 ENCOUNTER — Other Ambulatory Visit: Payer: Self-pay | Admitting: Internal Medicine

## 2017-10-31 DIAGNOSIS — Z961 Presence of intraocular lens: Secondary | ICD-10-CM | POA: Diagnosis not present

## 2017-11-23 ENCOUNTER — Encounter: Payer: Self-pay | Admitting: Nurse Practitioner

## 2017-11-23 ENCOUNTER — Ambulatory Visit: Payer: Self-pay | Admitting: Nurse Practitioner

## 2017-11-23 ENCOUNTER — Ambulatory Visit (INDEPENDENT_AMBULATORY_CARE_PROVIDER_SITE_OTHER): Payer: Medicare Other | Admitting: Nurse Practitioner

## 2017-11-23 VITALS — BP 125/83 | HR 71 | Temp 97.7°F | Resp 16 | Ht 68.0 in | Wt 190.0 lb

## 2017-11-23 DIAGNOSIS — J0141 Acute recurrent pansinusitis: Secondary | ICD-10-CM | POA: Diagnosis not present

## 2017-11-23 DIAGNOSIS — I10 Essential (primary) hypertension: Secondary | ICD-10-CM

## 2017-11-23 DIAGNOSIS — J4520 Mild intermittent asthma, uncomplicated: Secondary | ICD-10-CM

## 2017-11-23 DIAGNOSIS — J3089 Other allergic rhinitis: Secondary | ICD-10-CM | POA: Diagnosis not present

## 2017-11-23 DIAGNOSIS — J309 Allergic rhinitis, unspecified: Secondary | ICD-10-CM | POA: Insufficient documentation

## 2017-11-23 DIAGNOSIS — K219 Gastro-esophageal reflux disease without esophagitis: Secondary | ICD-10-CM | POA: Diagnosis not present

## 2017-11-23 DIAGNOSIS — J4521 Mild intermittent asthma with (acute) exacerbation: Secondary | ICD-10-CM | POA: Insufficient documentation

## 2017-11-23 MED ORDER — PANTOPRAZOLE SODIUM 40 MG PO TBEC: 40.0000 mg | DELAYED_RELEASE_TABLET | Freq: Every day | ORAL | 3 refills | Status: DC

## 2017-11-23 MED ORDER — PREDNISONE 10 MG (21) PO TBPK: ORAL_TABLET | ORAL | 0 refills | Status: DC

## 2017-11-23 MED ORDER — LEVOFLOXACIN 500 MG PO TABS: 500.0000 mg | ORAL_TABLET | Freq: Every day | ORAL | 0 refills | Status: DC

## 2017-11-23 NOTE — Progress Notes (Signed)
[facility] [place], [place] [address]  Internal MEDICINE  Office Visit Note  Patient Name: James Mathews  [id number]  [id number]  Date of Service: 11/23/2017  Chief Complaint  Patient presents with  . Sinusitis    several days   . Cough  . Headache     Sinusitis  This is a recurrent problem. The current episode started in the past 7 days. The problem has been gradually worsening since onset. There has been no fever. The pain is moderate. Associated symptoms include chills, congestion, coughing, ear pain, headaches, a hoarse voice and sneezing. Pertinent negatives include no neck pain, shortness of breath or sore throat. Past treatments include acetaminophen and oral decongestants. The treatment provided mild relief.   Pt is here for a sick visit.     Current Medication:  Outpatient Encounter Medications as of 11/23/2017  Medication Sig  . aspirin EC 81 MG tablet Take by mouth.  . cholecalciferol (VITAMIN D) 1000 units tablet Take 1,000 Units by mouth daily.  Marland Kitchen desloratadine (CLARINEX) 5 MG tablet TAKE 1 TABLET BY MOUTH DAILY FOR ALLERGIC RHINITIS  . fluticasone (FLONASE) 50 MCG/ACT nasal spray Place into the nose.  . levofloxacin (LEVAQUIN) 500 MG tablet Take 1 tablet (500 mg total) by mouth daily.  Marland Kitchen lisinopril (PRINIVIL,ZESTRIL) 5 MG tablet Take 5 mg by mouth daily.  . meloxicam (MOBIC) 7.5 MG tablet Take by mouth.  . montelukast (SINGULAIR) 10 MG tablet TAKE 1 TABLET BY MOUTH DAILY FOR CHRONIC ALLERGIC RHINITIS/ASTHMA  . Omega-3 Fatty Acids (FISH OIL PO) Take by mouth.  . pantoprazole (PROTONIX) 40 MG tablet Take 1 tablet (40 mg total) by mouth daily.  . predniSONE (STERAPRED UNI-PAK 21 TAB) 10 MG (21) TBPK tablet 6 day taper - take by mouth as directed for 6 days  . promethazine-codeine (PHENERGAN WITH CODEINE) 6.25-10 MG/5ML syrup Take 5 mLs by mouth every 6 (six) hours as needed for cough.  . simvastatin (ZOCOR) 40 MG tablet TAKE 1  TABLET BY MOUTH EVERY NIGHT AT BEDTIME  . vitamin C (ASCORBIC ACID) 500 MG tablet Take 500 mg by mouth daily.  . vitamin E 400 UNIT capsule Take 400 Units by mouth daily.  . [DISCONTINUED] levofloxacin (LEVAQUIN) 500 MG tablet Take 1 tablet (500 mg total) by mouth daily.  . [DISCONTINUED] omeprazole (PRILOSEC) 20 MG capsule Take by mouth.   No facility-administered encounter medications on file as of 11/23/2017.       Medical History: Past Medical History:  Diagnosis Date  . Asthma     Today's Vitals   11/23/17 0923  BP: 125/83  Pulse: 71  Resp: 16  Temp: 97.7 F (36.5 C)  SpO2: 95%  Weight: 190 lb (86.2 kg)  Height: 5\' 8"  (1.727 m)    Review of Systems  Constitutional: Positive for chills and fatigue. Negative for activity change, fever and unexpected weight change.  HENT: Positive for congestion, ear pain, hoarse voice, postnasal drip, rhinorrhea, sinus pain and sneezing. Negative for sore throat.   Eyes: Negative.  Negative for redness.  Respiratory: Positive for cough and wheezing. Negative for chest tightness and shortness of breath.   Cardiovascular: Negative for chest pain and palpitations.  Gastrointestinal: Positive for nausea. Negative for abdominal pain, constipation, diarrhea and vomiting.  Endocrine: Negative for cold intolerance, heat intolerance, polydipsia, polyphagia and polyuria.  Genitourinary: Negative.  Negative for dysuria and frequency.  Musculoskeletal: Positive for arthralgias. Negative for back pain, joint swelling and neck pain.  Skin:  Negative for rash.  Allergic/Immunologic: Positive for environmental allergies.  Neurological: Positive for headaches. Negative for tremors and numbness.  Hematological: Positive for adenopathy. Does not bruise/bleed easily.  Psychiatric/Behavioral: Negative for behavioral problems (Depression), sleep disturbance and suicidal ideas. The patient is not nervous/anxious.     Physical Exam  Constitutional: He is  oriented to person, place, and time. He appears well-developed and well-nourished. No distress.  HENT:  Head: Normocephalic and atraumatic.  Right Ear: Tympanic membrane is erythematous and bulging.  Left Ear: Tympanic membrane is erythematous and bulging.  Nose: Rhinorrhea present. Right sinus exhibits frontal sinus tenderness. Left sinus exhibits frontal sinus tenderness.  Mouth/Throat: Posterior oropharyngeal erythema present. No oropharyngeal exudate.  Non-occlusive cerumen in bilateral ear canals  Eyes: EOM are normal. Pupils are equal, round, and reactive to light.  Neck: Normal range of motion. Neck supple. No JVD present. No tracheal deviation present. No thyromegaly present.  Cardiovascular: Normal rate, regular rhythm and normal heart sounds. Exam reveals no gallop and no friction rub.  No murmur heard. Pulmonary/Chest: Effort normal and breath sounds normal. No respiratory distress. He has no wheezes. He has no rales. He exhibits no tenderness.  Harsh, dry cough present.  Abdominal: Soft. Bowel sounds are normal. There is no tenderness.  Musculoskeletal: Normal range of motion.  Lymphadenopathy:    He has cervical adenopathy.  Neurological: He is alert and oriented to person, place, and time. No cranial nerve deficit.  Skin: Skin is warm and dry. He is not diaphoretic.  Psychiatric: He has a normal mood and affect. His behavior is normal. Judgment and thought content normal.  Nursing note and vitals reviewed.   Assessment/Plan: 1. Acute recurrent pansinusitis Generally resistant to treatment.  - levofloxacin (LEVAQUIN) 500 MG tablet; Take 1 tablet (500 mg total) by mouth daily.  Dispense: 10 tablet; Refill: 0 - predniSONE (STERAPRED UNI-PAK 21 TAB) 10 MG (21) TBPK tablet; 6 day taper - take by mouth as directed for 6 days  Dispense: 21 tablet; Refill: 0  2. Mild intermittent asthma without complication Use albuterol rescue inhaler as needed and as prescribed .  3.  Gastroesophageal reflux disease without esophagitis - pantoprazole (PROTONIX) 40 MG tablet; Take 1 tablet (40 mg total) by mouth daily.  Dispense: 30 tablet; Refill: 3  4. Benign essential hypertension Generally stable. Continue bp medication as prescribed.   5. Non-seasonal allergic rhinitis, unspecified trigger Continue to take all allergy medications daily.   General Counseling: yovany clock understanding of the findings of todays visit and agrees with plan of treatment. I have discussed any further diagnostic evaluation that may be needed or ordered today. We also reviewed his medications today. he has been encouraged to call the office with any questions or concerns that should arise related to todays visit.   This patient was seen by Leretha Pol, FNP- C in Collaboration with Dr Lavera Guise as a part of collaborative care agreement    Meds ordered this encounter  Medications  . levofloxacin (LEVAQUIN) 500 MG tablet    Sig: Take 1 tablet (500 mg total) by mouth daily.    Dispense:  10 tablet    Refill:  0    Order Specific Question:   Supervising Provider    Answer:   Lavera Guise [[id number]]  . predniSONE (STERAPRED UNI-PAK 21 TAB) 10 MG (21) TBPK tablet    Sig: 6 day taper - take by mouth as directed for 6 days    Dispense:  21 tablet  Refill:  0    Order Specific Question:   Supervising Provider    Answer:   Lavera Guise [[id number]]  . pantoprazole (PROTONIX) 40 MG tablet    Sig: Take 1 tablet (40 mg total) by mouth daily.    Dispense:  30 tablet    Refill:  3    Order Specific Question:   Supervising Provider    Answer:   Lavera Guise [[id number]]    Time spent: 15 Minutes

## 2017-12-04 ENCOUNTER — Other Ambulatory Visit: Payer: Self-pay | Admitting: Nurse Practitioner

## 2017-12-04 ENCOUNTER — Telehealth: Payer: Self-pay

## 2017-12-04 DIAGNOSIS — J0141 Acute recurrent pansinusitis: Secondary | ICD-10-CM

## 2017-12-04 MED ORDER — LEVOFLOXACIN 500 MG PO TABS: 500.0000 mg | ORAL_TABLET | Freq: Every day | ORAL | 0 refills | Status: DC

## 2017-12-04 MED ORDER — PREDNISONE 10 MG (21) PO TBPK: ORAL_TABLET | ORAL | 0 refills | Status: DC

## 2017-12-04 NOTE — Progress Notes (Signed)
Patient called, c/o still not feeling well. Will do another round levofloxacin daily and prednisone taper for 6 days. Both prescriptions sent to [facility].

## 2017-12-04 NOTE — Telephone Encounter (Signed)
Spoke to pt's wife and advised that abx and pred taper sent to pharmacy.  dbs

## 2017-12-04 NOTE — Telephone Encounter (Signed)
Patient called, c/o still not feeling well. Will do another round levofloxacin daily and prednisone taper for 6 days. Both prescriptions sent to [facility].

## 2017-12-20 ENCOUNTER — Other Ambulatory Visit: Payer: Self-pay | Admitting: Nurse Practitioner

## 2017-12-20 ENCOUNTER — Telehealth: Payer: Self-pay

## 2017-12-20 DIAGNOSIS — J0141 Acute recurrent pansinusitis: Secondary | ICD-10-CM

## 2017-12-20 DIAGNOSIS — R059 Cough, unspecified: Secondary | ICD-10-CM

## 2017-12-20 DIAGNOSIS — R05 Cough: Secondary | ICD-10-CM

## 2017-12-20 MED ORDER — PREDNISONE 10 MG (21) PO TBPK: ORAL_TABLET | ORAL | 0 refills | Status: DC

## 2017-12-20 MED ORDER — LEVOFLOXACIN 500 MG PO TABS: 500.0000 mg | ORAL_TABLET | Freq: Every day | ORAL | 0 refills | Status: DC

## 2017-12-20 MED ORDER — PROMETHAZINE-CODEINE 6.25-10 MG/5ML PO SYRP: 5.0000 mL | ORAL_SOLUTION | Freq: Four times a day (QID) | ORAL | 0 refills | Status: DC | PRN

## 2017-12-20 NOTE — Progress Notes (Signed)
Patient called c/o cough, congestion, and fever. Sent repeat treatment of levofloxacin 500mg  daily, prednisone dose pack for 6 days, and promethazine/codein cough suppressant to take as needed for cough. Should notify the office if no improvement or worsening by Monday.

## 2017-12-20 NOTE — Telephone Encounter (Signed)
Patient called c/o cough, congestion, and fever. Sent repeat treatment of levofloxacin 500mg  daily, prednisone dose pack for 6 days, and promethazine/codein cough suppressant to take as needed for cough. Should notify the office if no improvement or worsening by Monday.

## 2017-12-20 NOTE — Telephone Encounter (Signed)
Pt advised we send antibiotic to [facility] see other note

## 2017-12-21 ENCOUNTER — Other Ambulatory Visit: Payer: Self-pay

## 2017-12-21 DIAGNOSIS — R05 Cough: Secondary | ICD-10-CM

## 2017-12-21 DIAGNOSIS — R059 Cough, unspecified: Secondary | ICD-10-CM

## 2017-12-21 NOTE — Telephone Encounter (Signed)
Called in guaifenesin-codeine 100-10 Take 2 tsp 3 times a day as needed For cough as per heather and d/c promethazine with codeine

## 2017-12-25 ENCOUNTER — Other Ambulatory Visit: Payer: Self-pay | Admitting: Nurse Practitioner

## 2017-12-25 MED ORDER — LISINOPRIL 2.5 MG PO TABS: 2.5000 mg | ORAL_TABLET | Freq: Every day | ORAL | 2 refills | Status: DC

## 2017-12-25 MED ORDER — MELOXICAM 7.5 MG PO TABS: 7.5000 mg | ORAL_TABLET | Freq: Every day | ORAL | 2 refills | Status: DC

## 2018-01-02 ENCOUNTER — Ambulatory Visit
Admission: RE | Admit: 2018-01-02 | Discharge: 2018-01-02 | Disposition: A | Discharge status: Home / Self Care | Payer: Medicare Other | Source: Ambulatory Visit | Attending: Nurse Practitioner | Admitting: Nurse Practitioner

## 2018-01-02 ENCOUNTER — Ambulatory Visit (INDEPENDENT_AMBULATORY_CARE_PROVIDER_SITE_OTHER): Payer: Medicare Other | Admitting: Nurse Practitioner

## 2018-01-02 ENCOUNTER — Encounter: Payer: Self-pay | Admitting: Nurse Practitioner

## 2018-01-02 VITALS — BP 113/98 | Temp 96.8°F | Resp 16 | Ht 68.0 in | Wt 188.0 lb

## 2018-01-02 DIAGNOSIS — R05 Cough: Secondary | ICD-10-CM | POA: Diagnosis not present

## 2018-01-02 DIAGNOSIS — R0602 Shortness of breath: Secondary | ICD-10-CM | POA: Insufficient documentation

## 2018-01-02 DIAGNOSIS — R059 Cough, unspecified: Secondary | ICD-10-CM | POA: Insufficient documentation

## 2018-01-02 DIAGNOSIS — J3089 Other allergic rhinitis: Secondary | ICD-10-CM | POA: Diagnosis not present

## 2018-01-02 DIAGNOSIS — J0141 Acute recurrent pansinusitis: Secondary | ICD-10-CM

## 2018-01-02 DIAGNOSIS — R079 Chest pain, unspecified: Secondary | ICD-10-CM | POA: Diagnosis not present

## 2018-01-02 MED ORDER — PREDNISONE 5 MG (48) PO TBPK: ORAL_TABLET | ORAL | 0 refills | Status: DC

## 2018-01-02 MED ORDER — CEFUROXIME AXETIL 500 MG PO TABS: 500.0000 mg | ORAL_TABLET | Freq: Two times a day (BID) | ORAL | 0 refills | Status: DC

## 2018-01-02 NOTE — Progress Notes (Signed)
[facility] [place], [place] [address]  Internal MEDICINE  Office Visit Note  Patient Name: James Mathews  [id number]  [id number]  Date of Service: 01/02/2018  Chief Complaint  Patient presents with  . Cough  . Generalized Body Aches  . Fever  . Headache  . Sinusitis     Cough  This is a recurrent problem. The current episode started in the past 7 days. The problem has been gradually worsening. The problem occurs every few minutes. The cough is non-productive. Associated symptoms include chest pain, chills, ear pain, headaches, postnasal drip, rhinorrhea and wheezing. Pertinent negatives include no eye redness, fever, rash, sore throat or shortness of breath. The symptoms are aggravated by exercise. He has tried a beta-agonist inhaler, oral steroids, prescription cough suppressant and cool air for the symptoms. The treatment provided mild relief. His past medical history is significant for asthma, bronchitis and environmental allergies.  Sinusitis  This is a recurrent problem. The current episode started in the past 7 days. The problem has been gradually worsening since onset. The maximum temperature recorded prior to his arrival was 100.4 - 100.9 F. The fever has been present for less than 1 day. His pain is at a severity of 3/10. The pain is moderate. Associated symptoms include chills, congestion, coughing, ear pain, headaches, a hoarse voice, sinus pressure and sneezing. Pertinent negatives include no neck pain, shortness of breath or sore throat. Past treatments include acetaminophen and oral decongestants (antibiotics and oral steroid use. ). The treatment provided mild relief.   Pt is here for a sick visit.     Current Medication:  Outpatient Encounter Medications as of 01/02/2018  Medication Sig  . aspirin EC 81 MG tablet Take by mouth.  . cefUROXime (CEFTIN) 500 MG tablet Take 1 tablet (500 mg total) by mouth 2 (two) times daily with a meal.   . cholecalciferol (VITAMIN D) 1000 units tablet Take 1,000 Units by mouth daily.  Marland Kitchen desloratadine (CLARINEX) 5 MG tablet TAKE 1 TABLET BY MOUTH DAILY FOR ALLERGIC RHINITIS  . fluticasone (FLONASE) 50 MCG/ACT nasal spray Place into the nose.  Marland Kitchen guaiFENesin-codeine (ROBITUSSIN AC) 100-10 MG/5ML syrup Take 2 tsp 3 times a day as needed  . lisinopril (PRINIVIL,ZESTRIL) 2.5 MG tablet Take 1 tablet (2.5 mg total) by mouth daily.  . meloxicam (MOBIC) 7.5 MG tablet Take 1 tablet (7.5 mg total) by mouth daily.  . montelukast (SINGULAIR) 10 MG tablet TAKE 1 TABLET BY MOUTH DAILY FOR CHRONIC ALLERGIC RHINITIS/ASTHMA  . Omega-3 Fatty Acids (FISH OIL PO) Take by mouth.  . pantoprazole (PROTONIX) 40 MG tablet Take 1 tablet (40 mg total) by mouth daily.  . predniSONE (STERAPRED UNI-PAK 48 TAB) 5 MG (48) TBPK tablet Take by mouth as directed for 12 days  . simvastatin (ZOCOR) 40 MG tablet TAKE 1 TABLET BY MOUTH EVERY NIGHT AT BEDTIME  . vitamin C (ASCORBIC ACID) 500 MG tablet Take 500 mg by mouth daily.  . vitamin E 400 UNIT capsule Take 400 Units by mouth daily.  . [DISCONTINUED] levofloxacin (LEVAQUIN) 500 MG tablet Take 1 tablet (500 mg total) by mouth daily. (Patient not taking: Reported on 01/02/2018)  . [DISCONTINUED] predniSONE (STERAPRED UNI-PAK 21 TAB) 10 MG (21) TBPK tablet 6 day taper - take by mouth as directed for 6 days (Patient not taking: Reported on 01/02/2018)   No facility-administered encounter medications on file as of 01/02/2018.       Medical History: Past Medical History:  Diagnosis Date  . Asthma      Vital Signs: BP (!) 113/98   Temp (!) 96.8 F (36 C)   Resp 16   Ht 5\' 8"  (1.727 m)   Wt 188 lb (85.3 kg)   SpO2 94%   BMI 28.59 kg/m    Review of Systems  Constitutional: Positive for chills and fatigue. Negative for activity change, fever and unexpected weight change.  HENT: Positive for congestion, ear pain, hoarse voice, postnasal drip, rhinorrhea, sinus pressure,  sinus pain and sneezing. Negative for sore throat.   Eyes: Negative.  Negative for redness.  Respiratory: Positive for cough, chest tightness and wheezing. Negative for shortness of breath.   Cardiovascular: Positive for chest pain. Negative for palpitations.  Gastrointestinal: Positive for nausea. Negative for abdominal pain, constipation, diarrhea and vomiting.  Endocrine: Negative for cold intolerance, heat intolerance, polydipsia, polyphagia and polyuria.  Genitourinary: Negative.  Negative for dysuria and frequency.  Musculoskeletal: Positive for arthralgias. Negative for back pain, joint swelling and neck pain.  Skin: Negative for rash.  Allergic/Immunologic: Positive for environmental allergies.  Neurological: Positive for dizziness and headaches. Negative for tremors and numbness.  Hematological: Positive for adenopathy. Does not bruise/bleed easily.  Psychiatric/Behavioral: Negative for behavioral problems (Depression), sleep disturbance and suicidal ideas. The patient is not nervous/anxious.     Physical Exam  Constitutional: He is oriented to person, place, and time. He appears well-developed and well-nourished. No distress.  HENT:  Head: Normocephalic and atraumatic.  Right Ear: Tympanic membrane is erythematous and bulging.  Left Ear: Tympanic membrane is erythematous and bulging.  Nose: Rhinorrhea present. Right sinus exhibits frontal sinus tenderness. Left sinus exhibits frontal sinus tenderness.  Mouth/Throat: Posterior oropharyngeal erythema present. No oropharyngeal exudate.  Non-occlusive cerumen in bilateral ear canals  Eyes: Pupils are equal, round, and reactive to light. EOM are normal.  Neck: Normal range of motion. Neck supple. No JVD present. No tracheal deviation present. No thyromegaly present.  Cardiovascular: Normal rate, regular rhythm and normal heart sounds. Exam reveals no gallop and no friction rub.  No murmur heard. Pulmonary/Chest: Effort normal. No  respiratory distress. He has wheezes. He has no rales. He exhibits no tenderness.  Harsh, dry cough present. Wheezes and congestion heard on left side of lungs.   Abdominal: Soft. Bowel sounds are normal. There is no tenderness.  Musculoskeletal: Normal range of motion.  Lymphadenopathy:    He has cervical adenopathy.  Neurological: He is alert and oriented to person, place, and time. No cranial nerve deficit.  Skin: Skin is warm and dry. He is not diaphoretic.  Psychiatric: He has a normal mood and affect. His behavior is normal. Judgment and thought content normal.  Nursing note and vitals reviewed.   Assessment/Plan: 1. Acute recurrent pansinusitis - cefUROXime (CEFTIN) 500 MG tablet; Take 1 tablet (500 mg total) by mouth 2 (two) times daily with a meal.  Dispense: 28 tablet; Refill: 0 - predniSONE (STERAPRED UNI-PAK 48 TAB) 5 MG (48) TBPK tablet; Take by mouth as directed for 12 days  Dispense: 48 tablet; Refill: 0  2. Cough Continue to use prescription cough suppressant as needed and as prescribed. mucinex as needed and as indicated. Chest x-ray for further evaluation.  - cefUROXime (CEFTIN) 500 MG tablet; Take 1 tablet (500 mg total) by mouth 2 (two) times daily with a meal.  Dispense: 28 tablet; Refill: 0 - predniSONE (STERAPRED UNI-PAK 48 TAB) 5 MG (48) TBPK tablet; Take by mouth as directed for 12 days  Dispense:  48 tablet; Refill: 0  3. Shortness of breath Chest x-ray for further evaluation.  - DG Chest 2 View; Future  4. Non-seasonal allergic rhinitis, unspecified trigger Continue all treatment for allergies.   General Counseling: eluterio seymour understanding of the findings of todays visit and agrees with plan of treatment. I have discussed any further diagnostic evaluation that may be needed or ordered today. We also reviewed his medications today. he has been encouraged to call the office with any questions or concerns that should arise related to todays visit.  Rest  and increase fluids. Continue using OTC medication to control symptoms.   This patient was seen by Leretha Pol, FNP- C in Collaboration with Dr Lavera Guise as a part of collaborative care agreement    Orders Placed This Encounter  Procedures  . DG Chest 2 View    Meds ordered this encounter  Medications  . cefUROXime (CEFTIN) 500 MG tablet    Sig: Take 1 tablet (500 mg total) by mouth 2 (two) times daily with a meal.    Dispense:  28 tablet    Refill:  0    Order Specific Question:   Supervising Provider    Answer:   Lavera Guise [place]  . predniSONE (STERAPRED UNI-PAK 48 TAB) 5 MG (48) TBPK tablet    Sig: Take by mouth as directed for 12 days    Dispense:  48 tablet    Refill:  0    Order Specific Question:   Supervising Provider    Answer:   Lavera Guise [[id number]]    Time spent: 15 Minutes

## 2018-01-03 ENCOUNTER — Telehealth: Payer: Self-pay

## 2018-01-03 NOTE — Telephone Encounter (Signed)
PT ADVISED CHEST XRAY IS NEGATIVE NO PNEUMONIA CONTINUE MED AS PRESCRIBED IF HIS NOT FEELING BETTER HE NEED TO SEE DR Lanice Schwab [facility]

## 2018-01-03 NOTE — Telephone Encounter (Signed)
-----   Message from Ronnell Freshwater, NP sent at 01/03/2018 10:39 AM EDT ----- Please let the patient know that chet x-ray is negative for pneumonia or other acute abnormality is present. Continue antibiotics and prednisone as prescribed. May need to see Dr. Devona Konig if gets worse or no better. thanks

## 2018-01-25 ENCOUNTER — Encounter: Payer: Self-pay | Admitting: Nurse Practitioner

## 2018-01-25 ENCOUNTER — Ambulatory Visit (INDEPENDENT_AMBULATORY_CARE_PROVIDER_SITE_OTHER): Payer: Medicare Other | Admitting: Nurse Practitioner

## 2018-01-25 VITALS — BP 126/80 | HR 79 | Temp 96.9°F | Resp 16 | Ht 68.0 in | Wt 188.8 lb

## 2018-01-25 DIAGNOSIS — R05 Cough: Secondary | ICD-10-CM

## 2018-01-25 DIAGNOSIS — J3089 Other allergic rhinitis: Secondary | ICD-10-CM | POA: Diagnosis not present

## 2018-01-25 DIAGNOSIS — J0141 Acute recurrent pansinusitis: Secondary | ICD-10-CM | POA: Diagnosis not present

## 2018-01-25 DIAGNOSIS — R059 Cough, unspecified: Secondary | ICD-10-CM

## 2018-01-25 MED ORDER — DESLORATADINE 5 MG PO TABS: 5.0000 mg | ORAL_TABLET | Freq: Every day | ORAL | 5 refills | Status: DC

## 2018-01-25 MED ORDER — CEFUROXIME AXETIL 500 MG PO TABS: 500.0000 mg | ORAL_TABLET | Freq: Two times a day (BID) | ORAL | 0 refills | Status: DC

## 2018-01-25 MED ORDER — PREDNISONE 10 MG (48) PO TBPK: ORAL_TABLET | ORAL | 0 refills | Status: DC

## 2018-01-25 MED ORDER — FLUTICASONE PROPIONATE 50 MCG/ACT NA SUSP: 2.0000 | Freq: Every day | NASAL | 3 refills | Status: DC

## 2018-01-25 NOTE — Progress Notes (Signed)
[facility] [place], [place] [address]  Internal MEDICINE  Office Visit Note  Patient Name: James Mathews  [id number]  [id number]  Date of Service: 02/18/2018   Pt is here for a sick visit.   Chief Complaint  Patient presents with  . Sinusitis    mucus drainage  . Nasal Congestion  . Cough  . Fatigue  . Fever    low grade  . Chills     Sinusitis  This is a recurrent problem. The current episode started 1 to 4 weeks ago. The problem has been gradually worsening since onset. There has been no fever. The pain is moderate. Associated symptoms include chills, congestion, coughing, ear pain, headaches, a hoarse voice, sinus pressure, sneezing, a sore throat and swollen glands. Pertinent negatives include no neck pain or shortness of breath. Past treatments include acetaminophen, antibiotics and spray decongestants. The treatment provided mild relief.        Current Medication:  Outpatient Encounter Medications as of 01/25/2018  Medication Sig  . aspirin EC 81 MG tablet Take by mouth.  . cefUROXime (CEFTIN) 500 MG tablet Take 1 tablet (500 mg total) by mouth 2 (two) times daily with a meal.  . cholecalciferol (VITAMIN D) 1000 units tablet Take 1,000 Units by mouth daily.  Marland Kitchen desloratadine (CLARINEX) 5 MG tablet Take 1 tablet (5 mg total) by mouth daily.  . fluticasone (FLONASE) 50 MCG/ACT nasal spray Place 2 sprays into both nostrils daily.  Marland Kitchen guaiFENesin-codeine (ROBITUSSIN AC) 100-10 MG/5ML syrup Take 2 tsp 3 times a day as needed  . lisinopril (PRINIVIL,ZESTRIL) 2.5 MG tablet Take 1 tablet (2.5 mg total) by mouth daily.  . meloxicam (MOBIC) 7.5 MG tablet Take 1 tablet (7.5 mg total) by mouth daily.  . montelukast (SINGULAIR) 10 MG tablet TAKE 1 TABLET BY MOUTH DAILY FOR CHRONIC ALLERGIC RHINITIS/ASTHMA  . Omega-3 Fatty Acids (FISH OIL PO) Take by mouth.  . pantoprazole (PROTONIX) 40 MG tablet Take 1 tablet (40 mg total) by mouth daily.  .  simvastatin (ZOCOR) 40 MG tablet TAKE 1 TABLET BY MOUTH EVERY NIGHT AT BEDTIME  . vitamin C (ASCORBIC ACID) 500 MG tablet Take 500 mg by mouth daily.  . vitamin E 400 UNIT capsule Take 400 Units by mouth daily.  . [DISCONTINUED] cefUROXime (CEFTIN) 500 MG tablet Take 1 tablet (500 mg total) by mouth 2 (two) times daily with a meal.  . [DISCONTINUED] desloratadine (CLARINEX) 5 MG tablet TAKE 1 TABLET BY MOUTH DAILY FOR ALLERGIC RHINITIS  . [DISCONTINUED] fluticasone (FLONASE) 50 MCG/ACT nasal spray Place into the nose.  . [DISCONTINUED] predniSONE (STERAPRED UNI-PAK 48 TAB) 5 MG (48) TBPK tablet Take by mouth as directed for 12 days  . predniSONE (STERAPRED UNI-PAK 48 TAB) 10 MG (48) TBPK tablet 12 day taper - take by mouth as directed for 12 days   No facility-administered encounter medications on file as of 01/25/2018.       Medical History: Past Medical History:  Diagnosis Date  . Asthma      Today's Vitals   01/25/18 1633  BP: 126/80  Pulse: 79  Resp: 16  Temp: (!) 96.9 F (36.1 C)  SpO2: 96%  Weight: 188 lb 12.8 oz (85.6 kg)  Height: 5\' 8"  (1.727 m)    Review of Systems  Constitutional: Positive for chills and fatigue. Negative for activity change, fever and unexpected weight change.  HENT: Positive for congestion, ear pain, hoarse voice, postnasal drip, rhinorrhea, sinus pressure, sinus  pain, sneezing, sore throat and voice change.   Eyes: Negative.  Negative for redness.  Respiratory: Positive for cough, chest tightness and wheezing. Negative for shortness of breath.   Cardiovascular: Positive for chest pain. Negative for palpitations.  Gastrointestinal: Positive for nausea. Negative for abdominal pain, constipation, diarrhea and vomiting.  Endocrine: Negative for cold intolerance, heat intolerance, polydipsia, polyphagia and polyuria.  Genitourinary: Negative.  Negative for dysuria and frequency.  Musculoskeletal: Positive for arthralgias. Negative for back pain, joint  swelling and neck pain.  Skin: Negative for rash.  Allergic/Immunologic: Positive for environmental allergies.  Neurological: Positive for dizziness and headaches. Negative for tremors and numbness.  Hematological: Positive for adenopathy. Does not bruise/bleed easily.  Psychiatric/Behavioral: Negative for behavioral problems (Depression), sleep disturbance and suicidal ideas. The patient is not nervous/anxious.     Physical Exam  Constitutional: He is oriented to person, place, and time. He appears well-developed and well-nourished. No distress.  HENT:  Head: Normocephalic and atraumatic.  Right Ear: Tympanic membrane is erythematous and bulging.  Left Ear: Tympanic membrane is erythematous and bulging.  Nose: Rhinorrhea present. Right sinus exhibits frontal sinus tenderness. Left sinus exhibits frontal sinus tenderness.  Mouth/Throat: Posterior oropharyngeal erythema present. No oropharyngeal exudate.  Non-occlusive cerumen in bilateral ear canals  Eyes: Pupils are equal, round, and reactive to light. EOM are normal.  Neck: Normal range of motion. Neck supple. No JVD present. No tracheal deviation present. No thyromegaly present.  Cardiovascular: Normal rate, regular rhythm and normal heart sounds. Exam reveals no gallop and no friction rub.  No murmur heard. Pulmonary/Chest: Effort normal. No respiratory distress. He has wheezes. He has no rales. He exhibits no tenderness.  Harsh, dry cough present. Wheezes and congestion heard on left side of lungs.   Abdominal: Soft. Bowel sounds are normal. There is no tenderness.  Musculoskeletal: Normal range of motion.  Lymphadenopathy:    He has cervical adenopathy.  Neurological: He is alert and oriented to person, place, and time. No cranial nerve deficit.  Skin: Skin is warm and dry. He is not diaphoretic.  Psychiatric: He has a normal mood and affect. His behavior is normal. Judgment and thought content normal.  Nursing note and vitals  reviewed.  Assessment/Plan:  1. Acute recurrent pansinusitis Start cefuroxime 500mg  twice daily for 10 days. Prednisone 10mg  dose pack. Take as directed for 12 days. Rest and increase fluids. OTC medication to help relieve symptoms.  - cefUROXime (CEFTIN) 500 MG tablet; Take 1 tablet (500 mg total) by mouth 2 (two) times daily with a meal.  Dispense: 42 tablet; Refill: 0 - predniSONE (STERAPRED UNI-PAK 48 TAB) 10 MG (48) TBPK tablet; 12 day taper - take by mouth as directed for 12 days  Dispense: 48 tablet; Refill: 0 - CBC, No Differential/Platelet; Future  2. Cough abx as prescribed. Previously prescribed cough suppressant as needed and as prescribed.  - cefUROXime (CEFTIN) 500 MG tablet; Take 1 tablet (500 mg total) by mouth 2 (two) times daily with a meal.  Dispense: 42 tablet; Refill: 0  3. Allergic rhinitis due to other allergic trigger, unspecified seasonality Add clarinex 5mg  daily. Use flonase nasal spray to use as prescribed.  - fluticasone (FLONASE) 50 MCG/ACT nasal spray; Place 2 sprays into both nostrils daily.  Dispense: 16 g; Refill: 3 - desloratadine (CLARINEX) 5 MG tablet; Take 1 tablet (5 mg total) by mouth daily.  Dispense: 30 tablet; Refill: 5  General Counseling: tyrick dunagan understanding of the findings of todays visit and agrees with  plan of treatment. I have discussed any further diagnostic evaluation that may be needed or ordered today. We also reviewed his medications today. he has been encouraged to call the office with any questions or concerns that should arise related to todays visit.    Counseling:  Rest and increase fluids. Continue using OTC medication to control symptoms.   This patient was seen by Leretha Pol, FNP- C in Collaboration with Dr Lavera Guise as a part of collaborative care agreement  Orders Placed This Encounter  Procedures  . CBC, No Differential/Platelet    Meds ordered this encounter  Medications  . cefUROXime (CEFTIN)  500 MG tablet    Sig: Take 1 tablet (500 mg total) by mouth 2 (two) times daily with a meal.    Dispense:  42 tablet    Refill:  0    Order Specific Question:   Supervising Provider    Answer:   Lavera Guise [place]  . fluticasone (FLONASE) 50 MCG/ACT nasal spray    Sig: Place 2 sprays into both nostrils daily.    Dispense:  16 g    Refill:  3    Order Specific Question:   Supervising Provider    Answer:   Lavera Guise [[id number]]  . desloratadine (CLARINEX) 5 MG tablet    Sig: Take 1 tablet (5 mg total) by mouth daily.    Dispense:  30 tablet    Refill:  5    Order Specific Question:   Supervising Provider    Answer:   Lavera Guise [[id number]]  . predniSONE (STERAPRED UNI-PAK 48 TAB) 10 MG (48) TBPK tablet    Sig: 12 day taper - take by mouth as directed for 12 days    Dispense:  48 tablet    Refill:  0    Order Specific Question:   Supervising Provider    Answer:   Lavera Guise [[id number]]    Time spent: 15 Minutes

## 2018-01-31 ENCOUNTER — Telehealth: Payer: Self-pay

## 2018-01-31 NOTE — Telephone Encounter (Signed)
Pt called and said he fell and no his hip has pain. I called back and told him we recommended he go see an orthopedic and he said he will make an appt at [facility] clinic because that is where is he went before.

## 2018-02-21 DIAGNOSIS — Z961 Presence of intraocular lens: Secondary | ICD-10-CM | POA: Diagnosis not present

## 2018-02-26 ENCOUNTER — Ambulatory Visit (INDEPENDENT_AMBULATORY_CARE_PROVIDER_SITE_OTHER): Payer: Medicare Other | Admitting: Nurse Practitioner

## 2018-02-26 ENCOUNTER — Encounter: Payer: Self-pay | Admitting: Nurse Practitioner

## 2018-02-26 ENCOUNTER — Other Ambulatory Visit: Payer: Self-pay | Admitting: Nurse Practitioner

## 2018-02-26 VITALS — BP 117/79 | HR 75 | Temp 96.7°F | Resp 16 | Ht 68.0 in | Wt 186.0 lb

## 2018-02-26 DIAGNOSIS — D509 Iron deficiency anemia, unspecified: Secondary | ICD-10-CM | POA: Diagnosis not present

## 2018-02-26 DIAGNOSIS — R5383 Other fatigue: Secondary | ICD-10-CM | POA: Diagnosis not present

## 2018-02-26 DIAGNOSIS — R0602 Shortness of breath: Secondary | ICD-10-CM | POA: Diagnosis not present

## 2018-02-26 DIAGNOSIS — J0141 Acute recurrent pansinusitis: Secondary | ICD-10-CM | POA: Diagnosis not present

## 2018-02-26 DIAGNOSIS — E538 Deficiency of other specified B group vitamins: Secondary | ICD-10-CM

## 2018-02-26 DIAGNOSIS — R059 Cough, unspecified: Secondary | ICD-10-CM

## 2018-02-26 DIAGNOSIS — I6523 Occlusion and stenosis of bilateral carotid arteries: Secondary | ICD-10-CM | POA: Diagnosis not present

## 2018-02-26 DIAGNOSIS — R42 Dizziness and giddiness: Secondary | ICD-10-CM

## 2018-02-26 DIAGNOSIS — R05 Cough: Secondary | ICD-10-CM

## 2018-02-26 MED ORDER — CEFUROXIME AXETIL 500 MG PO TABS: 500.0000 mg | ORAL_TABLET | Freq: Two times a day (BID) | ORAL | 0 refills | Status: DC

## 2018-02-26 MED ORDER — PREDNISONE 10 MG (48) PO TBPK: ORAL_TABLET | ORAL | 0 refills | Status: DC

## 2018-02-26 NOTE — Progress Notes (Signed)
[facility] [place], [place] [address]  Internal MEDICINE  Office Visit Note  Patient Name: James Mathews  [id number]  [id number]  Date of Service: 03/18/2018   Pt is here for routine follow up.   Chief Complaint  Patient presents with  . Cough  . Sinusitis  . Fatigue    severe. sleeping often. legs getting weak.     Patient reports severe fatigue. Has been going on for the past week. Is finding that he wants to be in dark room. Dozes off and on the entire day. Last week, he went to bed around 10pm, and was in bed until 4pm the next afternoon. Appetite is down. Has slight headache off and on. Has not noted any blodo in stool or urine. Has not noted any tick bites or other insect bites. Starting to developing a cough again. Has some nasal congestion. No nausea or vomiting.       Current Medication: Outpatient Encounter Medications as of 02/26/2018  Medication Sig  . aspirin EC 81 MG tablet Take by mouth.  . cefUROXime (CEFTIN) 500 MG tablet Take 1 tablet (500 mg total) by mouth 2 (two) times daily with a meal.  . cholecalciferol (VITAMIN D) 1000 units tablet Take 1,000 Units by mouth daily.  Marland Kitchen desloratadine (CLARINEX) 5 MG tablet Take 1 tablet (5 mg total) by mouth daily.  . fluticasone (FLONASE) 50 MCG/ACT nasal spray Place 2 sprays into both nostrils daily.  Marland Kitchen guaiFENesin-codeine (ROBITUSSIN AC) 100-10 MG/5ML syrup Take 2 tsp 3 times a day as needed  . lisinopril (PRINIVIL,ZESTRIL) 2.5 MG tablet Take 1 tablet (2.5 mg total) by mouth daily.  . meloxicam (MOBIC) 7.5 MG tablet Take 1 tablet (7.5 mg total) by mouth daily.  . montelukast (SINGULAIR) 10 MG tablet TAKE 1 TABLET BY MOUTH DAILY FOR CHRONIC ALLERGIC RHINITIS/ASTHMA  . Omega-3 Fatty Acids (FISH OIL PO) Take by mouth.  . pantoprazole (PROTONIX) 40 MG tablet Take 1 tablet (40 mg total) by mouth daily.  . predniSONE (STERAPRED UNI-PAK 48 TAB) 10 MG (48) TBPK tablet 12 day taper - take by mouth as  directed for 12 days  . simvastatin (ZOCOR) 40 MG tablet TAKE 1 TABLET BY MOUTH EVERY NIGHT AT BEDTIME  . vitamin C (ASCORBIC ACID) 500 MG tablet Take 500 mg by mouth daily.  . vitamin E 400 UNIT capsule Take 400 Units by mouth daily.  . [DISCONTINUED] cefUROXime (CEFTIN) 500 MG tablet Take 1 tablet (500 mg total) by mouth 2 (two) times daily with a meal.  . [DISCONTINUED] predniSONE (STERAPRED UNI-PAK 48 TAB) 10 MG (48) TBPK tablet 12 day taper - take by mouth as directed for 12 days (Patient not taking: Reported on 02/26/2018)   No facility-administered encounter medications on file as of 02/26/2018.     Surgical History: Past Surgical History:  Procedure Laterality Date  . cartaract surgery both eye Bilateral   . CORONARY ARTERY BYPASS GRAFT Bilateral   . CYSTOSCOPY    . HERNIA REPAIR    . knee replacment Bilateral   . retina surgery on right eye  Right 05/16/2017  . TONSILLECTOMY Bilateral     Medical History: Past Medical History:  Diagnosis Date  . Asthma     Family History: History reviewed. No pertinent family history.  Social History   Socioeconomic History  . Marital status: Married    Spouse name: Not on file  . Number of children: Not on file  . Years of education:  Not on file  . Highest education level: Not on file  Occupational History  . Not on file  Social Needs  . Financial resource strain: Not on file  . Food insecurity:    Worry: Not on file    Inability: Not on file  . Transportation needs:    Medical: Not on file    Non-medical: Not on file  Tobacco Use  . Smoking status: Never Smoker  . Smokeless tobacco: Never Used  Substance and Sexual Activity  . Alcohol use: No    Frequency: Never  . Drug use: No  . Sexual activity: Not on file  Lifestyle  . Physical activity:    Days per week: Not on file    Minutes per session: Not on file  . Stress: Not on file  Relationships  . Social connections:    Talks on phone: Not on file    Gets  together: Not on file    Attends religious service: Not on file    Active member of club or organization: Not on file    Attends meetings of clubs or organizations: Not on file    Relationship status: Not on file  . Intimate partner violence:    Fear of current or ex partner: Not on file    Emotionally abused: Not on file    Physically abused: Not on file    Forced sexual activity: Not on file  Other Topics Concern  . Not on file  Social History Narrative  . Not on file      Review of Systems  Constitutional: Positive for fatigue. Negative for activity change, chills, diaphoresis, fever and unexpected weight change.  HENT: Positive for congestion, postnasal drip, rhinorrhea, sinus pressure, sinus pain and voice change. Negative for ear pain, sneezing and sore throat.   Eyes: Negative.  Negative for redness.  Respiratory: Positive for cough and chest tightness. Negative for shortness of breath and wheezing.   Cardiovascular: Negative for chest pain and palpitations.  Gastrointestinal: Negative for abdominal pain, constipation, diarrhea, nausea and vomiting.  Endocrine: Negative for cold intolerance, heat intolerance, polydipsia, polyphagia and polyuria.  Genitourinary: Negative.  Negative for dysuria and frequency.  Musculoskeletal: Positive for arthralgias and myalgias. Negative for back pain, joint swelling and neck pain.  Skin: Negative for rash.  Allergic/Immunologic: Positive for environmental allergies.  Neurological: Positive for dizziness and headaches. Negative for tremors and numbness.  Hematological: Positive for adenopathy. Does not bruise/bleed easily.  Psychiatric/Behavioral: Negative for behavioral problems (Depression), sleep disturbance and suicidal ideas. The patient is not nervous/anxious.     Today's Vitals   02/26/18 1406  BP: 117/79  Pulse: 75  Resp: 16  Temp: (!) 96.7 F (35.9 C)  SpO2: 94%  Weight: 186 lb (84.4 kg)  Height: 5\' 8"  (1.727 m)     Physical Exam  Constitutional: He is oriented to person, place, and time. He appears well-developed and well-nourished. No distress.  HENT:  Head: Normocephalic and atraumatic.  Right Ear: Tympanic membrane is bulging. Tympanic membrane is not erythematous.  Left Ear: Tympanic membrane is bulging. Tympanic membrane is not erythematous.  Nose: Rhinorrhea present. Right sinus exhibits frontal sinus tenderness. Left sinus exhibits frontal sinus tenderness.  Mouth/Throat: No oropharyngeal exudate or posterior oropharyngeal erythema.  Non-occlusive cerumen in bilateral ear canals  Eyes: Pupils are equal, round, and reactive to light. EOM are normal.  Neck: Normal range of motion. Neck supple. No JVD present. No tracheal deviation present. No thyromegaly present.  Cardiovascular: Normal  rate, regular rhythm and normal heart sounds. Exam reveals no gallop and no friction rub.  No murmur heard. Pulmonary/Chest: Effort normal. No respiratory distress. He has no wheezes. He has no rales. He exhibits no tenderness.  Harsh, dry cough present. Breath sounds continue to be diminished lower left lobe of lungs .  Abdominal: Soft. Bowel sounds are normal. There is no tenderness.  Musculoskeletal: Normal range of motion.  Lymphadenopathy:    He has cervical adenopathy.  Neurological: He is alert and oriented to person, place, and time. No cranial nerve deficit.  Skin: Skin is warm and dry. He is not diaphoretic.  Psychiatric: He has a normal mood and affect. His behavior is normal. Judgment and thought content normal.  Nursing note and vitals reviewed.  Assessment/Plan: 1. Acute recurrent pansinusitis Start ceftin 500mg  bid for 21 days. Will also add prednisone  12 day dose pack. Take as directed for  12 days. contineu to rest and drink plenty of fluids.  cefUROXime (CEFTIN) 500 MG tablet; Take 1 tablet (500 mg total) by mouth 2 (two) times daily with a meal.  Dispense: 42 tablet; Refill: 0 -  predniSONE (STERAPRED UNI-PAK 48 TAB) 10 MG (48) TBPK tablet; 12 day taper - take by mouth as directed for 12 days  Dispense: 48 tablet; Refill: 0  2. Cough ceftin 500mg  bid for 21 days. Use OTC medication to treat acute cough.  - cefUROXime (CEFTIN) 500 MG tablet; Take 1 tablet (500 mg total) by mouth 2 (two) times daily with a meal.  Dispense: 42 tablet; Refill: 0  3. Dizziness Check labs for further evaluation. Treat as indicated.  - CBC With Differential; Future - Comprehensive metabolic panel; Future - TSH + free T4; Future - Iron, TIBC and Ferritin Panel; Future  4. Shortness of breath Labs ordered for further evaluation. Use rescue inhaler as needed and as prescribed.  - CBC With Differential; Future - Comprehensive metabolic panel; Future - TSH + free T4; Future - Iron, TIBC and Ferritin Panel; Future  5. Fatigue, unspecified type - CBC With Differential; Future - Comprehensive metabolic panel; Future - TSH + free T4; Future - Iron, TIBC and Ferritin Panel; Future  6. Bilateral carotid artery stenosis Carotid doppler ordered for further evaluation.  - VAS US CAROTID; Future  7. Vitamin B12 deficiency Check vitamin b12 level - Vitamin B12; Future  General Counseling: [email or web address] cuda understanding of the findings of todays visit and agrees with plan of treatment. I have discussed any further diagnostic evaluation that may be needed or ordered today. We also reviewed his medications today. he has been encouraged to call the office with any questions or concerns that should arise related to todays visit.    Counseling:  Rest and increase fluids. Continue using OTC medication to control symptoms.   This patient was seen by Leretha Pol, FNP- C in Collaboration with Dr Lavera Guise as a part of collaborative care agreement   Orders Placed This Encounter  Procedures  . CBC With Differential  . Comprehensive metabolic panel  . TSH + free T4  . Iron, TIBC and  Ferritin Panel  . Vitamin B12    Meds ordered this encounter  Medications  . cefUROXime (CEFTIN) 500 MG tablet    Sig: Take 1 tablet (500 mg total) by mouth 2 (two) times daily with a meal.    Dispense:  42 tablet    Refill:  0    Order Specific Question:   Supervising Provider  Answer:   Lavera Guise [place]  . predniSONE (STERAPRED UNI-PAK 48 TAB) 10 MG (48) TBPK tablet    Sig: 12 day taper - take by mouth as directed for 12 days    Dispense:  48 tablet    Refill:  0    Order Specific Question:   Supervising Provider    Answer:   Lavera Guise [[id number]]    Time spent: 2 Minutes     Dr Lavera Guise Internal medicine

## 2018-02-27 LAB — COMPREHENSIVE METABOLIC PANEL
A/G RATIO: 2 (ref 1.2–2.2)
ALT: 14 IU/L (ref 0–44)
AST: 22 IU/L (ref 0–40)
Albumin: 4.2 g/dL (ref 3.5–4.8)
Alkaline Phosphatase: 51 IU/L (ref 39–117)
BILIRUBIN TOTAL: 0.4 mg/dL (ref 0.0–1.2)
BUN/Creatinine Ratio: 15 (ref 10–24)
BUN: 12 mg/dL (ref 8–27)
CHLORIDE: 104 mmol/L (ref 96–106)
CO2: 22 mmol/L (ref 20–29)
Calcium: 9.6 mg/dL (ref 8.6–10.2)
Creatinine, Ser: 0.8 mg/dL (ref 0.76–1.27)
GFR calc Af Amer: 99 mL/min/{1.73_m2} (ref >59)
GFR calc non Af Amer: 86 mL/min/{1.73_m2} (ref >59)
GLOBULIN, TOTAL: 2.1 g/dL (ref 1.5–4.5)
Glucose: 100 mg/dL — ABNORMAL HIGH (ref 65–99)
POTASSIUM: 3.8 mmol/L (ref 3.5–5.2)
SODIUM: 141 mmol/L (ref 134–144)
Total Protein: 6.3 g/dL (ref 6.0–8.5)

## 2018-02-27 LAB — B12 AND FOLATE PANEL: Folate: 9.1 ng/mL (ref >3.0)

## 2018-02-27 LAB — CBC
Hematocrit: 40.6 % (ref 37.5–51.0)
Hemoglobin: 14 g/dL (ref 13.0–17.7)
MCH: 31.5 pg (ref 26.6–33.0)
MCHC: 34.5 g/dL (ref 31.5–35.7)
MCV: 91 fL (ref 79–97)
PLATELETS: 240 10*3/uL (ref 150–450)
RBC: 4.45 x10E6/uL (ref 4.14–5.80)
RDW: 15 % (ref 12.3–15.4)
WBC: 5.8 10*3/uL (ref 3.4–10.8)

## 2018-02-27 LAB — IRON AND TIBC
Iron Saturation: 21 % (ref 15–55)
Iron: 75 ug/dL (ref 38–169)
TIBC: 351 ug/dL (ref 250–450)
UIBC: 276 ug/dL (ref 111–343)

## 2018-02-27 LAB — FERRITIN: FERRITIN: 87 ng/mL (ref 30–400)

## 2018-02-27 LAB — T4, FREE: Free T4: 1.06 ng/dL (ref 0.82–1.77)

## 2018-02-27 LAB — TSH: TSH: 2.18 u[IU]/mL (ref 0.450–4.500)

## 2018-02-28 ENCOUNTER — Telehealth: Payer: Self-pay

## 2018-02-28 NOTE — Telephone Encounter (Signed)
Informed pt of lab results  

## 2018-03-18 DIAGNOSIS — E538 Deficiency of other specified B group vitamins: Secondary | ICD-10-CM | POA: Insufficient documentation

## 2018-03-22 ENCOUNTER — Other Ambulatory Visit: Payer: Self-pay | Admitting: Nurse Practitioner

## 2018-03-22 DIAGNOSIS — K219 Gastro-esophageal reflux disease without esophagitis: Secondary | ICD-10-CM

## 2018-03-22 MED ORDER — PANTOPRAZOLE SODIUM 40 MG PO TBEC: 40.0000 mg | DELAYED_RELEASE_TABLET | Freq: Every day | ORAL | 3 refills | Status: DC

## 2018-03-30 ENCOUNTER — Ambulatory Visit (INDEPENDENT_AMBULATORY_CARE_PROVIDER_SITE_OTHER): Payer: Medicare Other

## 2018-03-30 DIAGNOSIS — I6523 Occlusion and stenosis of bilateral carotid arteries: Secondary | ICD-10-CM

## 2018-04-03 DIAGNOSIS — E782 Mixed hyperlipidemia: Secondary | ICD-10-CM | POA: Diagnosis not present

## 2018-04-03 DIAGNOSIS — I10 Essential (primary) hypertension: Secondary | ICD-10-CM | POA: Diagnosis not present

## 2018-04-03 DIAGNOSIS — I6523 Occlusion and stenosis of bilateral carotid arteries: Secondary | ICD-10-CM | POA: Diagnosis not present

## 2018-04-03 DIAGNOSIS — I25810 Atherosclerosis of coronary artery bypass graft(s) without angina pectoris: Secondary | ICD-10-CM | POA: Diagnosis not present

## 2018-04-06 ENCOUNTER — Ambulatory Visit (INDEPENDENT_AMBULATORY_CARE_PROVIDER_SITE_OTHER): Payer: Medicare Other | Admitting: Nurse Practitioner

## 2018-04-06 ENCOUNTER — Encounter: Payer: Self-pay | Admitting: Nurse Practitioner

## 2018-04-06 VITALS — BP 130/84 | HR 74 | Resp 16 | Ht 68.0 in | Wt 186.0 lb

## 2018-04-06 DIAGNOSIS — J329 Chronic sinusitis, unspecified: Secondary | ICD-10-CM | POA: Diagnosis not present

## 2018-04-06 DIAGNOSIS — R5383 Other fatigue: Secondary | ICD-10-CM | POA: Diagnosis not present

## 2018-04-06 DIAGNOSIS — J3089 Other allergic rhinitis: Secondary | ICD-10-CM

## 2018-04-06 NOTE — Progress Notes (Signed)
[facility] [place], [place] [address]  Internal MEDICINE  Office Visit Note  Patient Name: James Mathews  [id number]  [id number]  Date of Service: 04/22/2018  Chief Complaint  Patient presents with  . Labs Only    review labs and carotid doppler    The patient has been treated for recurring sinusitis/bronchitis. Has had several rounds antibiotics and steroid tapers. His prior chest x-ray was negative for pneumonia or acute process. He has completed most recent round of antibiotics and prednisone. He states he is feeling much better. Does have intermittent coughing spells which can last several minutes. Had blood work done and carotid doppler. Blood work results were normal.       Current Medication: Outpatient Encounter Medications as of 04/06/2018  Medication Sig  . aspirin EC 81 MG tablet Take by mouth.  . cefUROXime (CEFTIN) 500 MG tablet Take 1 tablet (500 mg total) by mouth 2 (two) times daily with a meal.  . cholecalciferol (VITAMIN D) 1000 units tablet Take 1,000 Units by mouth daily.  Marland Kitchen desloratadine (CLARINEX) 5 MG tablet Take 1 tablet (5 mg total) by mouth daily.  . fluticasone (FLONASE) 50 MCG/ACT nasal spray Place 2 sprays into both nostrils daily.  Marland Kitchen guaiFENesin-codeine (ROBITUSSIN AC) 100-10 MG/5ML syrup Take 2 tsp 3 times a day as needed  . lisinopril (PRINIVIL,ZESTRIL) 2.5 MG tablet Take 1 tablet (2.5 mg total) by mouth daily.  . meloxicam (MOBIC) 7.5 MG tablet Take 1 tablet (7.5 mg total) by mouth daily.  . montelukast (SINGULAIR) 10 MG tablet TAKE 1 TABLET BY MOUTH DAILY FOR CHRONIC ALLERGIC RHINITIS/ASTHMA  . Omega-3 Fatty Acids (FISH OIL PO) Take by mouth.  . pantoprazole (PROTONIX) 40 MG tablet Take 1 tablet (40 mg total) by mouth daily.  . predniSONE (STERAPRED UNI-PAK 48 TAB) 10 MG (48) TBPK tablet 12 day taper - take by mouth as directed for 12 days  . simvastatin (ZOCOR) 40 MG tablet TAKE 1 TABLET BY MOUTH EVERY NIGHT AT  BEDTIME  . vitamin C (ASCORBIC ACID) 500 MG tablet Take 500 mg by mouth daily.  . vitamin E 400 UNIT capsule Take 400 Units by mouth daily.   No facility-administered encounter medications on file as of 04/06/2018.     Surgical History: Past Surgical History:  Procedure Laterality Date  . cartaract surgery both eye Bilateral   . CORONARY ARTERY BYPASS GRAFT Bilateral   . CYSTOSCOPY    . HERNIA REPAIR    . knee replacment Bilateral   . retina surgery on right eye  Right 05/16/2017  . TONSILLECTOMY Bilateral     Medical History: Past Medical History:  Diagnosis Date  . Asthma     Family History: History reviewed. No pertinent family history.  Social History   Socioeconomic History  . Marital status: Married    Spouse name: Not on file  . Number of children: Not on file  . Years of education: Not on file  . Highest education level: Not on file  Occupational History  . Not on file  Social Needs  . Financial resource strain: Not on file  . Food insecurity:    Worry: Not on file    Inability: Not on file  . Transportation needs:    Medical: Not on file    Non-medical: Not on file  Tobacco Use  . Smoking status: Never Smoker  . Smokeless tobacco: Never Used  Substance and Sexual Activity  . Alcohol use: No  Frequency: Never  . Drug use: No  . Sexual activity: Not on file  Lifestyle  . Physical activity:    Days per week: Not on file    Minutes per session: Not on file  . Stress: Not on file  Relationships  . Social connections:    Talks on phone: Not on file    Gets together: Not on file    Attends religious service: Not on file    Active member of club or organization: Not on file    Attends meetings of clubs or organizations: Not on file    Relationship status: Not on file  . Intimate partner violence:    Fear of current or ex partner: Not on file    Emotionally abused: Not on file    Physically abused: Not on file    Forced sexual activity: Not on  file  Other Topics Concern  . Not on file  Social History Narrative  . Not on file      Review of Systems  Constitutional: Positive for fatigue. Negative for activity change, chills, diaphoresis, fever and unexpected weight change.  HENT: Positive for rhinorrhea. Negative for congestion, ear pain, postnasal drip, sinus pressure, sinus pain, sneezing, sore throat and voice change.   Eyes: Negative.  Negative for redness.  Respiratory: Positive for cough. Negative for chest tightness, shortness of breath and wheezing.   Cardiovascular: Negative for chest pain and palpitations.  Gastrointestinal: Negative for abdominal pain, constipation, diarrhea, nausea and vomiting.  Endocrine: Negative for cold intolerance, heat intolerance, polydipsia, polyphagia and polyuria.  Genitourinary: Negative.  Negative for dysuria and frequency.  Musculoskeletal: Negative for arthralgias, back pain, joint swelling, myalgias and neck pain.  Skin: Negative for rash.  Allergic/Immunologic: Positive for environmental allergies.  Neurological: Positive for headaches. Negative for dizziness, tremors and numbness.  Hematological: Negative for adenopathy. Does not bruise/bleed easily.  Psychiatric/Behavioral: Negative for behavioral problems (Depression), sleep disturbance and suicidal ideas. The patient is not nervous/anxious.     Today's Vitals   04/06/18 1438  BP: 130/84  Pulse: 74  Resp: 16  SpO2: 96%  Height: 5\' 8"  (1.727 m)    Physical Exam  Constitutional: He is oriented to person, place, and time. He appears well-developed and well-nourished. No distress.  HENT:  Head: Normocephalic and atraumatic.  Right Ear: Tympanic membrane is bulging. Tympanic membrane is not erythematous.  Left Ear: Tympanic membrane is bulging. Tympanic membrane is not erythematous.  Nose: Rhinorrhea present. Right sinus exhibits no frontal sinus tenderness. Left sinus exhibits no frontal sinus tenderness.  Mouth/Throat:  No oropharyngeal exudate or posterior oropharyngeal erythema.  Non-occlusive cerumen in bilateral ear canals  Eyes: Pupils are equal, round, and reactive to light. EOM are normal.  Neck: Normal range of motion. Neck supple. No JVD present. No tracheal deviation present. No thyromegaly present.  Cardiovascular: Normal rate, regular rhythm and normal heart sounds. Exam reveals no gallop and no friction rub.  No murmur heard. Pulmonary/Chest: Effort normal. No respiratory distress. He has no wheezes. He has no rales. He exhibits no tenderness.  Harsh, dry cough present. Breath sounds continue to be diminished lower left lobe of lungs .  Abdominal: Soft. Bowel sounds are normal. There is no tenderness.  Musculoskeletal: Normal range of motion.  Lymphadenopathy:    He has cervical adenopathy.  Neurological: He is alert and oriented to person, place, and time. No cranial nerve deficit.  Skin: Skin is warm and dry. He is not diaphoretic.  Psychiatric: He has  a normal mood and affect. His behavior is normal. Judgment and thought content normal.  Nursing note and vitals reviewed.  Assessment/Plan:  1. Recurrent sinus infections Much improved since last visit. Will continue to monitor.   2. Fatigue, unspecified type Reviewed labs, which looked good. Continue to monitor.   3. Non-seasonal allergic rhinitis, unspecified trigger Continue allergy medication as prescribed.   General Counseling: [email or web address] walle understanding of the findings of todays visit and agrees with plan of treatment. I have discussed any further diagnostic evaluation that may be needed or ordered today. We also reviewed his medications today. he has been encouraged to call the office with any questions or concerns that should arise related to todays visit.  This patient was seen by Leretha Pol FNP Collaboration with Dr Lavera Guise as a part of collaborative care agreement    Time spent: 68 Minutes      Dr  Lavera Guise Internal medicine

## 2018-04-19 NOTE — Procedures (Signed)
[place], [place] [address]  DATE OF SERVICE: March 30, 2018  CAROTID DOPPLER INTERPRETATION:  Bilateral Carotid Ultrsasound and Color Doppler Examination was performed. The RIGHT CCA shows mild plaque in the vessel. The LEFT CCA shows mild plaque in the vessel. There was no significant intimal thickening noted in the RIGHT carotid artery. There was no significant intimal thickening in the LEFT carotid artery.  The RIGHT CCA shows peak systolic velocity of 49 cm per second. The end diastolic velocity is 13 cm per second on the RIGHT side. The RIGHT ICA shows peak systolic velocity of 60 per second. RIGHT sided ICA end diastolic velocity is 18 cm per second. The RIGHT ECA shows a peak systolic velocity of 51 cm per second. The ICA/CCA ratio is calculated to be 0.92. This suggests stenosis of less than 50%. The Vertebral Artery shows antegrade flow.  The LEFT CCA shows peak systolic velocity of 40 cm per second. The end diastolic velocity is 12 cm per second on the LEFT side. The LEFT ICA shows peak systolic velocity of 55 per second. LEFT sided ICA end diastolic velocity is 18 cm per second. The LEFT ECA shows a peak systolic velocity of 68 cm per second. The ICA/CCA ratio is calculated to be 1.38. This suggests stenosis of less than 50%. The Vertebral Artery shows antegrade flow.   Impression:    The RIGHT CAROTID shows stenosis of less than 50%. The LEFT CAROTID shows stenosis of less than 50%.  There is mild plaque formation noted on the LEFT and mild on the RIGHT  side. Consider a repeat Carotid doppler if clinical situation and symptoms warrant in 6-12 months. Patient should be encouraged to change lifestyles such as smoking cessation, regular exercise and dietary modification. Use of statins in the right clinical setting and ASA is encouraged.  Allyne Gee, MD [facility] Pulmonary Critical Care Medicine

## 2018-04-22 DIAGNOSIS — J329 Chronic sinusitis, unspecified: Secondary | ICD-10-CM | POA: Insufficient documentation

## 2018-06-06 ENCOUNTER — Other Ambulatory Visit: Payer: Self-pay | Admitting: Internal Medicine

## 2018-06-07 ENCOUNTER — Other Ambulatory Visit: Payer: Self-pay

## 2018-06-07 MED ORDER — MONTELUKAST SODIUM 10 MG PO TABS: ORAL_TABLET | ORAL | 5 refills | Status: DC

## 2018-06-13 DIAGNOSIS — D2261 Melanocytic nevi of right upper limb, including shoulder: Secondary | ICD-10-CM | POA: Diagnosis not present

## 2018-06-13 DIAGNOSIS — D225 Melanocytic nevi of trunk: Secondary | ICD-10-CM | POA: Diagnosis not present

## 2018-06-13 DIAGNOSIS — L538 Other specified erythematous conditions: Secondary | ICD-10-CM | POA: Diagnosis not present

## 2018-06-13 DIAGNOSIS — L298 Other pruritus: Secondary | ICD-10-CM | POA: Diagnosis not present

## 2018-06-13 DIAGNOSIS — L570 Actinic keratosis: Secondary | ICD-10-CM | POA: Diagnosis not present

## 2018-06-13 DIAGNOSIS — D485 Neoplasm of uncertain behavior of skin: Secondary | ICD-10-CM | POA: Diagnosis not present

## 2018-06-13 DIAGNOSIS — D2271 Melanocytic nevi of right lower limb, including hip: Secondary | ICD-10-CM | POA: Diagnosis not present

## 2018-06-13 DIAGNOSIS — B078 Other viral warts: Secondary | ICD-10-CM | POA: Diagnosis not present

## 2018-06-13 DIAGNOSIS — D2262 Melanocytic nevi of left upper limb, including shoulder: Secondary | ICD-10-CM | POA: Diagnosis not present

## 2018-06-13 DIAGNOSIS — D2272 Melanocytic nevi of left lower limb, including hip: Secondary | ICD-10-CM | POA: Diagnosis not present

## 2018-06-13 DIAGNOSIS — M75101 Unspecified rotator cuff tear or rupture of right shoulder, not specified as traumatic: Secondary | ICD-10-CM | POA: Diagnosis not present

## 2018-06-13 DIAGNOSIS — X32XXXA Exposure to sunlight, initial encounter: Secondary | ICD-10-CM | POA: Diagnosis not present

## 2018-06-13 DIAGNOSIS — L820 Inflamed seborrheic keratosis: Secondary | ICD-10-CM | POA: Diagnosis not present

## 2018-06-13 DIAGNOSIS — M1611 Unilateral primary osteoarthritis, right hip: Secondary | ICD-10-CM | POA: Diagnosis not present

## 2018-07-03 ENCOUNTER — Emergency Department: Payer: Medicare Other

## 2018-07-03 ENCOUNTER — Ambulatory Visit (HOSPITAL_COMMUNITY)
Admission: AD | Admit: 2018-07-03 | Discharge: 2018-07-03 | Disposition: A | Discharge status: Home / Self Care | Payer: Medicare Other | Source: Other Acute Inpatient Hospital | Attending: Emergency Medicine | Admitting: Emergency Medicine

## 2018-07-03 ENCOUNTER — Emergency Department
Admission: EM | Admit: 2018-07-03 | Discharge: 2018-07-03 | Disposition: A | Discharge status: Other Acute Inpatient Hospital | Payer: Medicare Other | Attending: Emergency Medicine | Admitting: Emergency Medicine

## 2018-07-03 DIAGNOSIS — I7103 Dissection of thoracoabdominal aorta: Secondary | ICD-10-CM | POA: Diagnosis not present

## 2018-07-03 DIAGNOSIS — I7101 Dissection of thoracic aorta: Secondary | ICD-10-CM | POA: Insufficient documentation

## 2018-07-03 DIAGNOSIS — R911 Solitary pulmonary nodule: Secondary | ICD-10-CM | POA: Diagnosis not present

## 2018-07-03 DIAGNOSIS — D649 Anemia, unspecified: Secondary | ICD-10-CM | POA: Diagnosis present

## 2018-07-03 DIAGNOSIS — J90 Pleural effusion, not elsewhere classified: Secondary | ICD-10-CM | POA: Diagnosis not present

## 2018-07-03 DIAGNOSIS — Z951 Presence of aortocoronary bypass graft: Secondary | ICD-10-CM | POA: Diagnosis not present

## 2018-07-03 DIAGNOSIS — J45909 Unspecified asthma, uncomplicated: Secondary | ICD-10-CM | POA: Insufficient documentation

## 2018-07-03 DIAGNOSIS — I2510 Atherosclerotic heart disease of native coronary artery without angina pectoris: Secondary | ICD-10-CM | POA: Diagnosis present

## 2018-07-03 DIAGNOSIS — E876 Hypokalemia: Secondary | ICD-10-CM

## 2018-07-03 DIAGNOSIS — I712 Thoracic aortic aneurysm, without rupture: Secondary | ICD-10-CM | POA: Diagnosis not present

## 2018-07-03 DIAGNOSIS — D689 Coagulation defect, unspecified: Secondary | ICD-10-CM

## 2018-07-03 DIAGNOSIS — R079 Chest pain, unspecified: Secondary | ICD-10-CM | POA: Diagnosis not present

## 2018-07-03 DIAGNOSIS — I7100 Dissection of unspecified site of aorta: Secondary | ICD-10-CM | POA: Diagnosis not present

## 2018-07-03 DIAGNOSIS — Z79899 Other long term (current) drug therapy: Secondary | ICD-10-CM | POA: Diagnosis not present

## 2018-07-03 DIAGNOSIS — I959 Hypotension, unspecified: Secondary | ICD-10-CM | POA: Diagnosis not present

## 2018-07-03 DIAGNOSIS — R0789 Other chest pain: Secondary | ICD-10-CM | POA: Diagnosis present

## 2018-07-03 DIAGNOSIS — Z960 Presence of urogenital implants: Secondary | ICD-10-CM | POA: Diagnosis not present

## 2018-07-03 DIAGNOSIS — I10 Essential (primary) hypertension: Secondary | ICD-10-CM | POA: Diagnosis present

## 2018-07-03 DIAGNOSIS — I71012 Dissection of descending thoracic aorta: Secondary | ICD-10-CM

## 2018-07-03 DIAGNOSIS — E782 Mixed hyperlipidemia: Secondary | ICD-10-CM | POA: Diagnosis present

## 2018-07-03 DIAGNOSIS — I714 Abdominal aortic aneurysm, without rupture: Secondary | ICD-10-CM | POA: Diagnosis not present

## 2018-07-03 DIAGNOSIS — Z87891 Personal history of nicotine dependence: Secondary | ICD-10-CM | POA: Diagnosis not present

## 2018-07-03 DIAGNOSIS — Z01818 Encounter for other preprocedural examination: Secondary | ICD-10-CM | POA: Diagnosis not present

## 2018-07-03 DIAGNOSIS — R091 Pleurisy: Secondary | ICD-10-CM | POA: Diagnosis not present

## 2018-07-03 DIAGNOSIS — Z7982 Long term (current) use of aspirin: Secondary | ICD-10-CM | POA: Insufficient documentation

## 2018-07-03 DIAGNOSIS — J9811 Atelectasis: Secondary | ICD-10-CM | POA: Diagnosis not present

## 2018-07-03 DIAGNOSIS — I723 Aneurysm of iliac artery: Secondary | ICD-10-CM | POA: Diagnosis not present

## 2018-07-03 DIAGNOSIS — K219 Gastro-esophageal reflux disease without esophagitis: Secondary | ICD-10-CM | POA: Diagnosis present

## 2018-07-03 DIAGNOSIS — Z452 Encounter for adjustment and management of vascular access device: Secondary | ICD-10-CM | POA: Diagnosis not present

## 2018-07-03 DIAGNOSIS — I719 Aortic aneurysm of unspecified site, without rupture: Secondary | ICD-10-CM | POA: Diagnosis not present

## 2018-07-03 DIAGNOSIS — I480 Paroxysmal atrial fibrillation: Secondary | ICD-10-CM | POA: Diagnosis present

## 2018-07-03 DIAGNOSIS — I739 Peripheral vascular disease, unspecified: Secondary | ICD-10-CM | POA: Diagnosis present

## 2018-07-03 DIAGNOSIS — R918 Other nonspecific abnormal finding of lung field: Secondary | ICD-10-CM | POA: Diagnosis not present

## 2018-07-03 DIAGNOSIS — Z9889 Other specified postprocedural states: Secondary | ICD-10-CM | POA: Diagnosis not present

## 2018-07-03 DIAGNOSIS — J984 Other disorders of lung: Secondary | ICD-10-CM | POA: Diagnosis not present

## 2018-07-03 DIAGNOSIS — I25810 Atherosclerosis of coronary artery bypass graft(s) without angina pectoris: Secondary | ICD-10-CM | POA: Diagnosis not present

## 2018-07-03 HISTORY — DX: Atherosclerotic heart disease of native coronary artery without angina pectoris: I25.10

## 2018-07-03 HISTORY — DX: Essential (primary) hypertension: I10

## 2018-07-03 LAB — TROPONIN I

## 2018-07-03 LAB — CBC
HEMATOCRIT: 40.9 % (ref 39.0–52.0)
HEMATOCRIT: 37.9 % — AB (ref 39.0–52.0)
Hemoglobin: 13.6 g/dL (ref 13.0–17.0)
Hemoglobin: 12.5 g/dL — ABNORMAL LOW (ref 13.0–17.0)
MCH: 30.4 pg (ref 26.0–34.0)
MCH: 30 pg (ref 26.0–34.0)
MCHC: 33.3 g/dL (ref 30.0–36.0)
MCHC: 33 g/dL (ref 30.0–36.0)
MCV: 91.5 fL (ref 80.0–100.0)
MCV: 91.1 fL (ref 80.0–100.0)
NRBC: 0 % (ref 0.0–0.2)
PLATELETS: 156 10*3/uL (ref 150–400)
Platelets: 184 10*3/uL (ref 150–400)
RBC: 4.47 MIL/uL (ref 4.22–5.81)
RBC: 4.16 MIL/uL — ABNORMAL LOW (ref 4.22–5.81)
RDW: 13.9 % (ref 11.5–15.5)
RDW: 14 % (ref 11.5–15.5)
WBC: 7.2 10*3/uL (ref 4.0–10.5)
WBC: 7 10*3/uL (ref 4.0–10.5)
nRBC: 0 % (ref 0.0–0.2)

## 2018-07-03 LAB — COMPREHENSIVE METABOLIC PANEL
ALT: 13 U/L (ref 0–44)
AST: 24 U/L (ref 15–41)
Albumin: 3.8 g/dL (ref 3.5–5.0)
Alkaline Phosphatase: 41 U/L (ref 38–126)
Anion gap: 10 (ref 5–15)
BILIRUBIN TOTAL: 0.8 mg/dL (ref 0.3–1.2)
BUN: 13 mg/dL (ref 8–23)
CALCIUM: 9.1 mg/dL (ref 8.9–10.3)
CO2: 23 mmol/L (ref 22–32)
CREATININE: 0.98 mg/dL (ref 0.61–1.24)
Chloride: 106 mmol/L (ref 98–111)
GFR calc Af Amer: >60 mL/min (ref >60)
Glucose, Bld: 169 mg/dL — ABNORMAL HIGH (ref 70–99)
POTASSIUM: 3 mmol/L — AB (ref 3.5–5.1)
Sodium: 139 mmol/L (ref 135–145)
Total Protein: 6.6 g/dL (ref 6.5–8.1)

## 2018-07-03 LAB — PROTIME-INR
INR: 2.24
INR: 1.1
PROTHROMBIN TIME: 14.1 s (ref 11.4–15.2)
Prothrombin Time: 24.5 seconds — ABNORMAL HIGH (ref 11.4–15.2)

## 2018-07-03 LAB — APTT: aPTT: 36 seconds (ref 24–36)

## 2018-07-03 MED ORDER — ESMOLOL HCL-SODIUM CHLORIDE 2000 MG/100ML IV SOLN
25.0000 ug/kg/min | Freq: Once | INTRAVENOUS | Status: AC
  Administered 2018-07-03: 25 ug/kg/min via INTRAVENOUS
  Filled 2018-07-03: qty 100

## 2018-07-03 MED ORDER — FENTANYL CITRATE (PF) 100 MCG/2ML IJ SOLN
50.0000 ug | Freq: Once | INTRAMUSCULAR | Status: AC
  Administered 2018-07-03: 50 ug via INTRAVENOUS
  Filled 2018-07-03: qty 2

## 2018-07-03 MED ORDER — IOHEXOL 350 MG/ML SOLN
75.0000 mL | Freq: Once | INTRAVENOUS | Status: AC | PRN
  Administered 2018-07-03: 75 mL via INTRAVENOUS

## 2018-07-03 MED ORDER — NITROGLYCERIN 0.4 MG SL SUBL
0.4000 mg | SUBLINGUAL_TABLET | SUBLINGUAL | Status: DC | PRN
  Administered 2018-07-03: 0.4 mg via SUBLINGUAL
  Filled 2018-07-03 (×2): qty 1

## 2018-07-03 NOTE — ED Triage Notes (Signed)
Pt to ED from home via [facility] c/o Chest Pain. Per EMS pt reported mid sternal chest pain that radiates to his back. Pt reported taking 324 mg ASA and EMS report administering 2 SL nitro sprays. Patient alert and oriented x4 at this time.

## 2018-07-03 NOTE — ED Provider Notes (Signed)
[facility] Emergency Department Provider Note   ____________________________________________   First MD Initiated Contact with Patient 07/03/18 1629     (approximate)  I have reviewed the triage vital signs and the nursing notes.  HISTORY   EM caveat: Acuity of condition limits history due to time acuity  Chief Complaint Chest Pain  HPI James Mathews is a 77 y.o. male presents for evaluation after sudden severe chest pain starting in the front of his chest  and reading to his back.  It started just prior to calling 9 1  Patient does report he said for previous heart bypasses.  He was given 325 mill grams of aspirin and 2 nitroglycerin tablets with partial relief of pain.  Reports of severe stabbing pain in the front of his chest.  No shortness of breath.  Some slight nauseated feeling earlier.  No abdominal pain.  No numbness or weakness in the arms or legs.  No headache.  Past Medical History:  Diagnosis Date  . Asthma   . Coronary artery disease   . Hypertension     Patient Active Problem List   Diagnosis Date Noted  . Recurrent sinus infections 04/22/2018  . Vitamin B12 deficiency 03/18/2018  . Fatigue 02/26/2018  . Dizziness 02/26/2018  . Cough 01/02/2018  . Shortness of breath 01/02/2018  . Acute recurrent pansinusitis 11/23/2017  . Gastroesophageal reflux disease without esophagitis 11/23/2017  . Non-seasonal allergic rhinitis 11/23/2017  . Mild intermittent asthma without complication 89/38/1017  . CAD (coronary artery disease) 09/28/2017  . Hyperlipidemia, mixed 09/28/2017  . Bilateral carotid artery stenosis 06/25/2015  . Benign essential hypertension 06/17/2015  . Peripheral vascular disease ([place]) 05/27/2014    Past Surgical History:  Procedure Laterality Date  . cartaract surgery both eye Bilateral   . CORONARY ARTERY BYPASS GRAFT Bilateral   . CYSTOSCOPY    . HERNIA REPAIR    . knee replacment Bilateral   . retina  surgery on right eye  Right 05/16/2017  . TONSILLECTOMY Bilateral     Prior to Admission medications   Medication Sig Start Date End Date Taking? Authorizing Provider  aspirin EC 81 MG tablet Take by mouth.    [provider]  cefUROXime (CEFTIN) 500 MG tablet Take 1 tablet (500 mg total) by mouth 2 (two) times daily with a meal. 02/26/18   Ronnell Freshwater, NP  cholecalciferol (VITAMIN D) 1000 units tablet Take 1,000 Units by mouth daily.    [provider]  desloratadine (CLARINEX) 5 MG tablet Take 1 tablet (5 mg total) by mouth daily. 01/25/18   Ronnell Freshwater, NP  fluticasone (FLONASE) 50 MCG/ACT nasal spray Place 2 sprays into both nostrils daily. 01/25/18   Ronnell Freshwater, NP  guaiFENesin-codeine (ROBITUSSIN AC) 100-10 MG/5ML syrup Take 2 tsp 3 times a day as needed    [provider]  lisinopril (PRINIVIL,ZESTRIL) 2.5 MG tablet Take 1 tablet (2.5 mg total) by mouth daily. 12/25/17   Ronnell Freshwater, NP  meloxicam (MOBIC) 7.5 MG tablet Take 1 tablet (7.5 mg total) by mouth daily. 12/25/17   Boscia, Greer Ee, NP  montelukast (SINGULAIR) 10 MG tablet TAKE 1 TABLET BY MOUTH DAILY FOR CHRONIC ALLERGIC RHINITIS/ASTHMA 06/07/18   Boscia, Heather E, NP  montelukast (SINGULAIR) 10 MG tablet TAKE 1 TABLET BY MOUTH DAILY FOR CHRONIC ALLERGIC RHINITIS/ASTHMA 06/07/18   Ronnell Freshwater, NP  Omega-3 Fatty Acids (FISH OIL PO) Take by mouth.    [provider]  pantoprazole (PROTONIX) 40 MG tablet Take 1 tablet (40 mg total) by mouth daily. 03/22/18   Ronnell Freshwater, NP  predniSONE (STERAPRED UNI-PAK 48 TAB) 10 MG (48) TBPK tablet 12 day taper - take by mouth as directed for 12 days 02/26/18   Ronnell Freshwater, NP  simvastatin (ZOCOR) 40 MG tablet TAKE 1 TABLET BY MOUTH EVERY NIGHT AT BEDTIME 07/14/17   [provider]  vitamin C (ASCORBIC ACID) 500 MG tablet Take 500 mg by mouth daily.    [provider]  vitamin E 400 UNIT capsule Take 400  Units by mouth daily.    [provider]    Allergies Patient has no known allergies.  History reviewed. No pertinent family history.  Social History Social History   Tobacco Use  . Smoking status: Never Smoker  . Smokeless tobacco: Never Used  Substance Use Topics  . Alcohol use: No    Frequency: Never  . Drug use: No    Review of Systems Constitutional: No fever/chills Cardiovascular: See HPI respiratory: Denies shortness of breath. Gastrointestinal: No abdominal pain.   Genitourinary: Negative for dysuria. Musculoskeletal: Negative for back pain except between his shoulder blades. Skin: Negative for rash. Neurological: Negative for headaches, areas of focal weakness or numbness.    ____________________________________________   PHYSICAL EXAM:  VITAL SIGNS: ED Triage Vitals  Enc Vitals Group     BP 07/03/18 1627 127/71     Pulse Rate 07/03/18 1627 (!) 104     Resp 07/03/18 1627 20     Temp 07/03/18 1627 98.1 F (36.7 C)     Temp Source 07/03/18 1627 Oral     SpO2 07/03/18 1627 94 %     Weight 07/03/18 1628 185 lb (83.9 kg)     Height 07/03/18 1628 5\' 8"  (1.727 m)     Head Circumference --      Peak Flow --      Pain Score 07/03/18 1628 8     Pain Loc --      Pain Edu? --      Excl. in [place]? --     Constitutional: Alert and oriented.  Appears in moderate pain, slightly pale.  Skin slightly cool to touch. Eyes: Conjunctivae are normal. Head: Atraumatic. Nose: No congestion/rhinnorhea. Mouth/Throat: Mucous membranes are moist. Neck: No stridor.  Cardiovascular: Normal rate, regular rhythm. Grossly normal heart sounds.  Good peripheral circulation. Respiratory: Normal respiratory effort.  No retractions. Lungs CTAB. Gastrointestinal: Soft and nontender. No distention.  No pulsatile mass. Musculoskeletal: No lower extremity tenderness nor edema. Neurologic:  Normal speech and language. No gross focal neurologic deficits are appreciated.  Skin:   Skin is warm, dry and intact. No rash noted. Psychiatric: Mood and affect are normal. Speech and behavior are normal.  ____________________________________________   LABS (all labs ordered are listed, but only abnormal results are displayed)  Labs Reviewed  COMPREHENSIVE METABOLIC PANEL - Abnormal; Notable for the following components:      Result Value   Potassium 3.0 (*)    Glucose, Bld 169 (*)    All other components within normal limits  PROTIME-INR - Abnormal; Notable for the following components:   Prothrombin Time 24.5 (*)    All other components within normal limits  CBC - Abnormal; Notable for the following components:   RBC 4.16 (*)    Hemoglobin 12.5 (*)    HCT 37.9 (*)    All other components within normal limits  CBC  TROPONIN I  APTT  PROTIME-INR  TYPE AND SCREEN   ____________________________________________  EKG  Reviewed and entered by me at 1625 Heart rate 100 QRS 89 QTC 420 A. fib, rate controlled There is depression in multiple leads including posterior region, concerning for acute ischemic change. ____________________________________________  RADIOLOGY  Dg Chest Port 1 View  Result Date: 07/03/2018 CLINICAL DATA:  Chest pain EXAM: PORTABLE CHEST 1 VIEW COMPARISON:  01/02/2018, 10/17/2016, 02/03/2016 FINDINGS: Rotated patient. No focal opacity or pleural effusion. Post sternotomy changes. No pneumothorax. Pleural and parenchymal scarring at the right apex. Increased mediastinal width with mild convex bulging of the right mediastinal border. IMPRESSION: 1. Increased mediastinal width with mild convex bulging of right mediastinal contour, likely augmented by patient rotation, however if acute aortic disease is suspected, would obtain CTA chest to evaluate. 2. Stable pleural and parenchymal scarring at the right apex. Electronically Signed   By: Donavan Foil M.D.   On: 07/03/2018 16:47   Ct Angio Chest Aorta W And/or Wo Contrast  Result Date:  07/03/2018 CLINICAL DATA:  Chest pain radiating to the back. EXAM: CT ANGIOGRAPHY CHEST WITH CONTRAST TECHNIQUE: Multidetector CT imaging of the chest was performed using the standard protocol during bolus administration of intravenous contrast. Multiplanar CT image reconstructions and MIPs were obtained to evaluate the vascular anatomy. CONTRAST:  2mL OMNIPAQUE IOHEXOL 350 MG/ML SOLN COMPARISON:  Chest radiograph from earlier today. 08/24/2007 chest CT. FINDINGS: Cardiovascular: Top-normal heart size. No significant pericardial effusion/thickening. Three-vessel coronary atherosclerosis status post CABG. Atherosclerotic thoracic aorta. 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 There is acute intramural hematoma in the thoracic aorta beginning proximally just beyond the takeoff of the left subclavian artery in the aortic isthmus and extending into the lower descending thoracic aorta. There is ulcerated plaque with active hemorrhage into the thickened wall of the proximal descending thoracic aorta (series 6/image 34). No dissection flap within the thoracic aorta. No additional sites of active bleeding into the wall of the thoracic aorta. There is new associated aneurysmal dilatation of aortic isthmus and descending thoracic aorta up to 4.1 cm diameter. No central pulmonary emboli. Mediastinum/Nodes: No discrete thyroid nodules. Unremarkable esophagus. No pathologically enlarged axillary, mediastinal or hilar lymph nodes. Lungs/Pleura: No pneumothorax. No pleural effusion. 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 Peripheral left lower lobe 8 mm solid pulmonary nodule (series 7/image 89) is stable since 2008 chest CT and considered benign. Parenchymal band in the medial left lower  lobe is compatible with postinfectious/postinflammatory scarring. No acute consolidative airspace disease, lung masses or new significant pulmonary nodules. Upper abdomen: Small hiatal hernia. Simple 1.7 cm left liver lobe cyst. Musculoskeletal: No aggressive appearing focal osseous lesions. Intact sternotomy wires. Moderate thoracic spondylosis. Review of the MIP images confirms the above findings. IMPRESSION: 1. Acute aortic syndrome characterized by acute type B intramural hematoma throughout the aortic isthmus and descending thoracic aorta. Ulcerated plaque with focal active hemorrhage into the wall of the thoracic aorta in the proximal descending thoracic aorta. Associated new aneurysmal dilatation of the aortic isthmus and descending thoracic aorta up to 4.1 cm diameter. Urgent cardiothoracic surgical consultation advised. 2. 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 3. 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 No active pulmonary disease. 4. Small hiatal hernia. Aortic Atherosclerosis (ICD10-I70.0).  Critical Value/emergent results were called by telephone at the time of interpretation on 07/03/2018 at 5:44 pm to Dr. Delman Kitten , who verbally acknowledged these results. Electronically Signed   By: Ilona Sorrel M.D.   On: 07/03/2018 17:47     CT scan images reviewed by me, discussed with radiologist ____________________________________________   PROCEDURES  Procedure(s) performed: None  Procedures  Critical Care performed: Yes, see critical care note(s)  CRITICAL CARE Performed by: Delman Kitten   Total critical care time: 70 minutes  Critical care time was exclusive of separately billable procedures and treating other patients.  Critical care was necessary to treat or prevent imminent or life-threatening deterioration.  Critical care was time spent personally by me on the following activities:  development of treatment plan with patient and/or surrogate as well as nursing, discussions with consultants, evaluation of patient's response to treatment, examination of patient, obtaining history from patient or surrogate, ordering and performing treatments and interventions, ordering and review of laboratory studies, ordering and review of radiographic studies, pulse oximetry and re-evaluation of patient's condition.  Additional critical care time was required and arranging transfer the patient, consultation with vascular surgery, radiology, discussion with patient and family, reviewing test results, initiation of esmolol infusion, pain control. ____________________________________________   INITIAL IMPRESSION / [place] / ED COURSE  Pertinent labs & imaging results that were available during my care of the patient were reviewed by me and considered in my medical decision making (see chart for details).      Chest pain Differential diagnosis includes, but is not limited to, ACS, aortic dissection, pulmonary embolism, cardiac tamponade, pneumothorax, pneumonia, pericarditis, myocarditis, GI-related causes including esophagitis/gastritis, and musculoskeletal chest wall pain.  Some relief with nitrates, EKG changes that initially concerning for possible ischemia, consult appreciated by Dr. end of cardiology.  The patient was found on CT angiogram to have aortic dissection pathology.  Patient blood pressure is normotensive, ordered esmolol infusion and repetitive doses of fentanyl for pain control.  Discussed with [facility] transfer center after discussion with vascular surgery here, the patient will require transfer on emergent basis to [facility] for further consultation and treatment.  [facility] advises and agreeable with plan for esmolol infusion and tight blood pressure control.   Clinical Course as of Jul 03 1852  Tue Jul 03, 2018  1745 D/W Radiology. Acute Type B  intramural hematoma and active bleeding into the thoracic aorta!!!. Critical.    [MQ]  1751 INR resent. Patient reports he only takes ASA and no other blood thinner or anticoagulant.   [place] contact re: emergency transfer request   [MQ]  1754 Vascular surgery (Dr. Ronalee Belts still in [place]), stat consult relayed to him via [place]. At this time, requesting emergency transfer to [facility]. Patient requests [facility] at this time. Requesting air medical if available.    [MQ]  1800 Dr. Ronalee Belts in ED now, evaluating and recommending transfer to thoracic surgery (agrees with [facility]), BP management (esmolol) and reversal of any anticoagulation (if present). Advises transfer. Stat transfer in process.    [MQ]  1806 CT Angio Chest Aorta W and/or Wo Contrast [KP]    Clinical Course User Index [KP] Harvest Dark, MD [MQ] Delman Kitten, MD   ----------------------------------------- 6:52 PM on 07/03/2018 -----------------------------------------  Patient awake and alert, pain improving after re-dosing of fentanyl, esmolol infusion initiated.  Orders for titration given to [facility] transport team.  Patient awake and alert, does appear pale, normal vital signs  at this time and appears stable for transfer to [facility] with critical care team.  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

## 2018-07-03 NOTE — ED Notes (Signed)
[facility]SF[facility]  CALLED  PER  QUALE  MD

## 2018-07-03 NOTE — Consult Note (Addendum)
Cardiology Consultation:   Patient ID: James Mathews MRN: [id number]; DOB: 03-17-1941  Admit date: 07/03/2018 Date of Consult: 07/03/2018  Primary Care Provider: Ronnell Freshwater, NP Primary Cardiologist: Serafina Royals, MD Primary Electrophysiologist:  None    Patient Profile:   James Mathews is a 77 y.o. male with a hx of coronary artery disease status post 5 vessel CABG in 2008 at [facility] (LIMA to LAD, SVG to D3, SVG to PDA, SVG to RPL 1, and SVG to RPL 2), PSVT, spontaneous mesenteric artery dissection hypertension, hyperlipidemia, and PAD, who is being seen today for the evaluation of chest pain and abnormal EKG at the request of Dr. Jacqualine Code.  History of Present Illness:   James Mathews was in his usual state of health until shortly after for this afternoon when he suddenly developed severe substernal chest pain radiating to the back.  He notes some accompanying shortness of breath.  He was resting at the time that the pain began.  He was immediately called EMS and was transported to [facility] for further evaluation.  He has received sublingual nitroglycerin with transient improvement in pain.  However, within a few minutes, pain has returned and is currently 8/10.  He describes it as both sharp and aching now predominantly in the upper back.  In retrospect, he reports increasing exertional dyspnea over the last month.  He has been compliant with his medications.  He denies palpitations, lightheadedness, and edema.  Past Medical History:  Diagnosis Date  . Asthma   . Hypertension     Past Surgical History:  Procedure Laterality Date  . cartaract surgery both eye Bilateral   . CORONARY ARTERY BYPASS GRAFT Bilateral   . CYSTOSCOPY    . HERNIA REPAIR    . knee replacment Bilateral   . retina surgery on right eye  Right 05/16/2017  . TONSILLECTOMY Bilateral      Home Medications:  Prior to Admission medications   Medication Sig Start Date Braxxton Stoudt Date Taking? Authorizing Provider    aspirin EC 81 MG tablet Take by mouth.    [provider]  cefUROXime (CEFTIN) 500 MG tablet Take 1 tablet (500 mg total) by mouth 2 (two) times daily with a meal. 02/26/18   Ronnell Freshwater, NP  cholecalciferol (VITAMIN D) 1000 units tablet Take 1,000 Units by mouth daily.    [provider]  desloratadine (CLARINEX) 5 MG tablet Take 1 tablet (5 mg total) by mouth daily. 01/25/18   Ronnell Freshwater, NP  fluticasone (FLONASE) 50 MCG/ACT nasal spray Place 2 sprays into both nostrils daily. 01/25/18   Ronnell Freshwater, NP  guaiFENesin-codeine (ROBITUSSIN AC) 100-10 MG/5ML syrup Take 2 tsp 3 times a day as needed    [provider]  lisinopril (PRINIVIL,ZESTRIL) 2.5 MG tablet Take 1 tablet (2.5 mg total) by mouth daily. 12/25/17   Ronnell Freshwater, NP  meloxicam (MOBIC) 7.5 MG tablet Take 1 tablet (7.5 mg total) by mouth daily. 12/25/17   Boscia, Greer Ee, NP  montelukast (SINGULAIR) 10 MG tablet TAKE 1 TABLET BY MOUTH DAILY FOR CHRONIC ALLERGIC RHINITIS/ASTHMA 06/07/18   Boscia, Heather E, NP  montelukast (SINGULAIR) 10 MG tablet TAKE 1 TABLET BY MOUTH DAILY FOR CHRONIC ALLERGIC RHINITIS/ASTHMA 06/07/18   Ronnell Freshwater, NP  Omega-3 Fatty Acids (FISH OIL PO) Take by mouth.    [provider]  pantoprazole (PROTONIX) 40 MG tablet Take 1 tablet (40 mg total) by mouth daily. 03/22/18   Ronnell Freshwater,  NP  predniSONE (STERAPRED UNI-PAK 48 TAB) 10 MG (48) TBPK tablet 12 day taper - take by mouth as directed for 12 days 02/26/18   Ronnell Freshwater, NP  simvastatin (ZOCOR) 40 MG tablet TAKE 1 TABLET BY MOUTH EVERY NIGHT AT BEDTIME 07/14/17   [provider]  vitamin C (ASCORBIC ACID) 500 MG tablet Take 500 mg by mouth daily.    [provider]  vitamin E 400 UNIT capsule Take 400 Units by mouth daily.    [provider]    Inpatient Medications: Scheduled Meds:  Continuous Infusions:  PRN Meds: nitroGLYCERIN  Allergies:   No Known  Allergies  Social History:   Social History   Socioeconomic History  . Marital status: Married    Spouse name: Not on file  . Number of children: Not on file  . Years of education: Not on file  . Highest education level: Not on file  Occupational History  . Not on file  Social Needs  . Financial resource strain: Not on file  . Food insecurity:    Worry: Not on file    Inability: Not on file  . Transportation needs:    Medical: Not on file    Non-medical: Not on file  Tobacco Use  . Smoking status: Never Smoker  . Smokeless tobacco: Never Used  Substance and Sexual Activity  . Alcohol use: No    Frequency: Never  . Drug use: No  . Sexual activity: Not on file  Lifestyle  . Physical activity:    Days per week: Not on file    Minutes per session: Not on file  . Stress: Not on file  Relationships  . Social connections:    Talks on phone: Not on file    Gets together: Not on file    Attends religious service: Not on file    Active member of club or organization: Not on file    Attends meetings of clubs or organizations: Not on file    Relationship status: Not on file  . Intimate partner violence:    Fear of current or ex partner: Not on file    Emotionally abused: Not on file    Physically abused: Not on file    Forced sexual activity: Not on file  Other Topics Concern  . Not on file  Social History Narrative  . Not on file    Family History:   Notable for high blood pressure, MI, and heart failure in his father and high blood pressure in his mother.  ROS:  Please see the history of present illness. All other ROS reviewed and negative.     Physical Exam/Data:   Vitals:   07/03/18 1627 07/03/18 1628  BP: 127/71   Pulse: (!) 104   Resp: 20   Temp: 98.1 F (36.7 C)   TempSrc: Oral   SpO2: 94%   Weight:  83.9 kg  Height:  5\' 8"  (1.727 m)   No intake or output data in the 24 hours ending 07/03/18 1713 Filed Weights   07/03/18 1628  Weight: 83.9 kg    Body mass index is 28.13 kg/m.  General: Pale appearing man, lying in bed.  He appears uncomfortable.  His wife and son are at the bedside. HEENT: normal Lymph: no adenopathy Neck: no JVD Endocrine:  No thryomegaly Vascular: No carotid bruits; 2+ radial ulcers bilaterally. Cardiac: Irregularly irregular without murmurs or rubs. Lungs:  clear to auscultation bilaterally, no wheezing, rhonchi or rales  Abd: soft, nontender, no hepatomegaly  Ext: no edema Musculoskeletal:  No deformities, BUE and BLE strength normal and equal Skin: warm and dry  Neuro:  CNs 2-12 intact, no focal abnormalities noted Psych:  Normal affect   EKG:  The EKG was personally reviewed and demonstrates: Atrial fibrillation with early R wave transition, borderline LVH, and 1 mm ST depressions in V1 to through V4 as well as nonspecific ST changes in the inferolateral leads. Telemetry:  Telemetry was personally reviewed and demonstrates: Atrial fibrillation.  Relevant CV Studies: None available.  Laboratory Data:  Chemistry Recent Labs  Lab 07/03/18 1627  NA 139  K 3.0*  CL 106  CO2 23  GLUCOSE 169*  BUN 13  CREATININE 0.98  CALCIUM 9.1  GFRNONAA >60  GFRAA >60  ANIONGAP 10    Recent Labs  Lab 07/03/18 1627  PROT 6.6  ALBUMIN 3.8  AST 24  ALT 13  ALKPHOS 41  BILITOT 0.8   Hematology Recent Labs  Lab 07/03/18 1627  WBC 7.2  RBC 4.47  HGB 13.6  HCT 40.9  MCV 91.5  MCH 30.4  MCHC 33.3  RDW 13.9  PLT 184   Cardiac Enzymes Recent Labs  Lab 07/03/18 1627  TROPONINI <0.03   No results for input(s): TROPIPOC in the last 168 hours.  BNPNo results for input(s): BNP, PROBNP in the last 168 hours.  DDimer No results for input(s): DDIMER in the last 168 hours.  Radiology/Studies:  Dg Chest Port 1 View  Result Date: 07/03/2018 CLINICAL DATA:  Chest pain EXAM: PORTABLE CHEST 1 VIEW COMPARISON:  01/02/2018, 10/17/2016, 02/03/2016 FINDINGS: Rotated patient. No focal opacity or pleural  effusion. Post sternotomy changes. No pneumothorax. Pleural and parenchymal scarring at the right apex. Increased mediastinal width with mild convex bulging of the right mediastinal border. IMPRESSION: 1. Increased mediastinal width with mild convex bulging of right mediastinal contour, likely augmented by patient rotation, however if acute aortic disease is suspected, would obtain CTA chest to evaluate. 2. Stable pleural and parenchymal scarring at the right apex. Electronically Signed   By: Donavan Foil M.D.   On: 07/03/2018 16:47    Assessment and Plan:   Chest pain Symptoms are certainly concerning for acute coronary syndrome, but given radiation to the back, there is also possibility for aortic dissection.  Risk is elevated given the patient's history of spontaneous mesenteric artery dissection.  I agree with Dr. Malachi Bonds plan to proceed with CTA of the chest, abdomen, and pelvis to exclude aortic dissection.  If this is negative and patient continues to have pain, I recommend proceeding with urgent cardiac catheterization for high risk non-ST elevation ACS.  EKG shows some findings that could represent posterior MI, though I do not believe the EKG meet STEMI criteria at this point.  I have reviewed the risks, indications, and alternatives to cardiac catheterization, possible angioplasty, and stenting with the patient. Risks include but are not limited to bleeding, infection, vascular injury, stroke, myocardial infection, arrhythmia, kidney injury, radiation-related injury in the case of prolonged fluoroscopy use, emergency cardiac surgery, and death. The patient understands the risks of serious complication is 1-2 in 1610 with diagnostic cardiac cath and 1-2% or less with angioplasty/stenting.  Meantime, sublingual and IV nitroglycerin can be used for relief of chest pain, as blood pressure tolerates.  This will need to be given judiciously given concern for posterior MI.  New onset atrial  fibrillation Heart rate line elevated.  Could consider adding low-dose beta-blockade and initiation  of heparin infusion following CTA if no evidence of dissection.  Po kalemia Potassium should be repleted to maintain level greater than 4.0.  Coagulopathy INR noted to be elevated at 2.2.  Patient does not take warfarin or other anticoagulants at home.  Interestingly, LFTs are normal and the patient does not have a history of underlying liver disease.  I wonder if INR sample was contaminated.  Disposition: To be determined based on results of CTA chest.  Patient follows with Dr. Nehemiah Massed of [facility].  [place] will assume the patient's cardiology care tomorrow.  For questions or updates, please contact [place] Please consult [email or web address] for contact info under [facility].  Signed, Nelva Bush, MD  07/03/2018 5:13 PM

## 2018-07-03 NOTE — ED Notes (Signed)
[place]  WITH PT  ALSO

## 2018-07-04 MED ORDER — GENERIC EXTERNAL MEDICATION
Status: DC
Start: ? — End: 2018-07-04

## 2018-07-04 MED ORDER — OXYCODONE HCL 5 MG PO TABS
5.00 | ORAL_TABLET | ORAL | Status: DC
Start: ? — End: 2018-07-04

## 2018-07-04 MED ORDER — GENERIC EXTERNAL MEDICATION
200.00 | Status: DC
Start: ? — End: 2018-07-04

## 2018-07-04 MED ORDER — PANTOPRAZOLE SODIUM 40 MG PO TBEC
40.00 | DELAYED_RELEASE_TABLET | ORAL | Status: DC
Start: 2018-07-09 — End: 2018-07-04

## 2018-07-04 MED ORDER — LACTATED RINGERS IV SOLN
INTRAVENOUS | Status: DC
Start: ? — End: 2018-07-04

## 2018-07-04 MED ORDER — ASPIRIN 81 MG PO CHEW
81.00 | CHEWABLE_TABLET | ORAL | Status: DC
Start: 2018-07-05 — End: 2018-07-04

## 2018-07-04 MED ORDER — MONTELUKAST SODIUM 10 MG PO TABS
10.00 | ORAL_TABLET | ORAL | Status: DC
Start: 2018-07-08 — End: 2018-07-04

## 2018-07-04 MED ORDER — SIMVASTATIN 20 MG PO TABS
40.00 | ORAL_TABLET | ORAL | Status: DC
Start: 2018-07-08 — End: 2018-07-04

## 2018-07-04 MED ORDER — LABETALOL HCL 5 MG/ML IV SOLN
0.50 | INTRAVENOUS | Status: DC
Start: 2018-07-04 — End: 2018-07-04

## 2018-07-04 MED ORDER — GENERIC EXTERNAL MEDICATION
1000.00 | Status: DC
Start: 2018-07-09 — End: 2018-07-04

## 2018-07-04 MED ORDER — SENNOSIDES-DOCUSATE SODIUM 8.6-50 MG PO TABS
2.00 | ORAL_TABLET | ORAL | Status: DC
Start: 2018-07-08 — End: 2018-07-04

## 2018-07-04 MED ORDER — ALBUTEROL SULFATE HFA 108 (90 BASE) MCG/ACT IN AERS
2.00 | INHALATION_SPRAY | RESPIRATORY_TRACT | Status: DC
Start: ? — End: 2018-07-04

## 2018-07-04 MED ORDER — VITAMIN C 500 MG PO TABS
500.00 | ORAL_TABLET | ORAL | Status: DC
Start: 2018-07-09 — End: 2018-07-04

## 2018-07-04 MED ORDER — FENTANYL CITRATE (PF) 50 MCG/ML IJ SOLN
25.00 | INTRAMUSCULAR | Status: DC
Start: ? — End: 2018-07-04

## 2018-07-04 MED ORDER — ACETAMINOPHEN 325 MG PO TABS
650.00 | ORAL_TABLET | ORAL | Status: DC
Start: 2018-07-08 — End: 2018-07-04

## 2018-07-04 MED ORDER — GENERIC EXTERNAL MEDICATION
800.00 | Status: DC
Start: 2018-07-09 — End: 2018-07-04

## 2018-07-05 ENCOUNTER — Encounter: Payer: Self-pay | Admitting: Nurse Practitioner

## 2018-07-05 MED ORDER — HYDROMORPHONE HCL 1 MG/ML IJ SOLN
0.25 | INTRAMUSCULAR | Status: DC
Start: ? — End: 2018-07-05

## 2018-07-05 MED ORDER — POTASSIUM CHLORIDE 20 MEQ/100ML IV SOLN
20.00 | INTRAVENOUS | Status: DC
Start: 2018-07-05 — End: 2018-07-05

## 2018-07-05 MED ORDER — CLEVIDIPINE 25 MG/50ML IV EMUL
2.00 | INTRAVENOUS | Status: DC
Start: ? — End: 2018-07-05

## 2018-07-05 MED ORDER — AMLODIPINE BESYLATE 10 MG PO TABS
10.00 | ORAL_TABLET | ORAL | Status: DC
Start: 2018-07-09 — End: 2018-07-05

## 2018-07-09 MED ORDER — ASPIRIN EC 325 MG PO TBEC
325.00 | DELAYED_RELEASE_TABLET | ORAL | Status: DC
Start: 2018-07-09 — End: 2018-07-09

## 2018-07-09 MED ORDER — POLYETHYLENE GLYCOL 3350 17 G PO PACK
17.00 | PACK | ORAL | Status: DC
Start: ? — End: 2018-07-09

## 2018-07-09 MED ORDER — OXYCODONE HCL 5 MG PO TABS
5.00 | ORAL_TABLET | ORAL | Status: DC
Start: ? — End: 2018-07-09

## 2018-07-19 ENCOUNTER — Ambulatory Visit (INDEPENDENT_AMBULATORY_CARE_PROVIDER_SITE_OTHER): Payer: Medicare Other | Admitting: Adult Health

## 2018-07-19 ENCOUNTER — Ambulatory Visit
Admission: RE | Admit: 2018-07-19 | Discharge: 2018-07-19 | Disposition: A | Discharge status: Home / Self Care | Payer: Medicare Other | Source: Ambulatory Visit | Attending: Adult Health | Admitting: Adult Health

## 2018-07-19 ENCOUNTER — Encounter: Payer: Self-pay | Admitting: Adult Health

## 2018-07-19 VITALS — BP 127/79 | HR 88 | Temp 96.3°F | Resp 16 | Ht 68.0 in | Wt 179.0 lb

## 2018-07-19 DIAGNOSIS — J069 Acute upper respiratory infection, unspecified: Secondary | ICD-10-CM | POA: Diagnosis not present

## 2018-07-19 DIAGNOSIS — R0602 Shortness of breath: Secondary | ICD-10-CM | POA: Diagnosis not present

## 2018-07-19 DIAGNOSIS — R918 Other nonspecific abnormal finding of lung field: Secondary | ICD-10-CM | POA: Insufficient documentation

## 2018-07-19 DIAGNOSIS — R0989 Other specified symptoms and signs involving the circulatory and respiratory systems: Secondary | ICD-10-CM

## 2018-07-19 DIAGNOSIS — R059 Cough, unspecified: Secondary | ICD-10-CM

## 2018-07-19 DIAGNOSIS — R05 Cough: Secondary | ICD-10-CM

## 2018-07-19 DIAGNOSIS — J329 Chronic sinusitis, unspecified: Secondary | ICD-10-CM | POA: Diagnosis not present

## 2018-07-19 MED ORDER — IPRATROPIUM-ALBUTEROL 0.5-2.5 (3) MG/3ML IN SOLN
3.0000 mL | Freq: Once | RESPIRATORY_TRACT | Status: AC
  Administered 2018-07-19: 3 mL via RESPIRATORY_TRACT

## 2018-07-19 MED ORDER — LEVOFLOXACIN 500 MG PO TABS: 500.0000 mg | ORAL_TABLET | Freq: Every day | ORAL | 0 refills | Status: DC

## 2018-07-19 MED ORDER — PREDNISONE 10 MG PO TABS: ORAL_TABLET | ORAL | 0 refills | Status: DC

## 2018-07-19 NOTE — Progress Notes (Signed)
[facility] [place], [place] [address]  Internal MEDICINE  Office Visit Note  Patient Name: James Mathews  [id number]  [id number]  Date of Service: 07/19/2018  Chief Complaint  Patient presents with  . Sinusitis  . Cough     HPI Pt is here for a sick visit. He reports about one week of sinus pressure and drainage.  He reports cough especially when he lays down at night.  He states he can hear rattling in his chest once he lays down.  He denies fever, but reports SOB and states "I just feel like I can't get enough air, I'm breathing harder".        Current Medication:  Outpatient Encounter Medications as of 07/19/2018  Medication Sig  . acetaminophen (TYLENOL) 325 MG tablet Take 650 mg by mouth every 6 (six) hours as needed.  Marland Kitchen albuterol (PROVENTIL HFA;VENTOLIN HFA) 108 (90 Base) MCG/ACT inhaler Inhale into the lungs every 6 (six) hours as needed for wheezing or shortness of breath.  Marland Kitchen amLODipine (NORVASC) 10 MG tablet Take 10 mg by mouth daily.  Marland Kitchen aspirin 325 MG EC tablet Take 325 mg by mouth daily.  . Calcium-Magnesium-Vitamin D 400-166.7-133.3 MG-MG-UNIT TABS Take by mouth.  . cholecalciferol (VITAMIN D3) 10 MCG (400 UNIT) TABS tablet Take 400 Units by mouth.  . cyanocobalamin 1000 MCG tablet Take 1,000 mcg by mouth daily.  Marland Kitchen desloratadine (CLARINEX) 5 MG tablet Take 1 tablet (5 mg total) by mouth daily.  . fluticasone (FLONASE) 50 MCG/ACT nasal spray Place 2 sprays into both nostrils daily.  . montelukast (SINGULAIR) 10 MG tablet TAKE 1 TABLET BY MOUTH DAILY FOR CHRONIC ALLERGIC RHINITIS/ASTHMA  . Omega-3 Fatty Acids (FISH OIL PO) Take by mouth.  . oxyCODONE (OXY IR/ROXICODONE) 5 MG immediate release tablet Take 5 mg by mouth every 4 (four) hours as needed for severe pain.  . pantoprazole (PROTONIX) 40 MG tablet Take 1 tablet (40 mg total) by mouth daily.  . polyethylene glycol (MIRALAX / GLYCOLAX) packet Take 17 g by mouth daily.  Marland Kitchen  senna-docusate (SENOKOT-S) 8.6-50 MG tablet Take 1 tablet by mouth daily.  . simvastatin (ZOCOR) 40 MG tablet TAKE 1 TABLET BY MOUTH EVERY NIGHT AT BEDTIME  . vitamin C (ASCORBIC ACID) 500 MG tablet Take 500 mg by mouth daily.  . vitamin E 400 UNIT capsule Take 400 Units by mouth daily.  . cefUROXime (CEFTIN) 500 MG tablet Take 1 tablet (500 mg total) by mouth 2 (two) times daily with a meal.  . guaiFENesin-codeine (ROBITUSSIN AC) 100-10 MG/5ML syrup Take 2 tsp 3 times a day as needed  . levofloxacin (LEVAQUIN) 500 MG tablet Take 1 tablet (500 mg total) by mouth daily.  Marland Kitchen lisinopril (PRINIVIL,ZESTRIL) 2.5 MG tablet Take 1 tablet (2.5 mg total) by mouth daily.  . predniSONE (DELTASONE) 10 MG tablet Use per dose pack  . predniSONE (STERAPRED UNI-PAK 48 TAB) 10 MG (48) TBPK tablet 12 day taper - take by mouth as directed for 12 days (Patient not taking: Reported on 07/19/2018)  . [DISCONTINUED] aspirin EC 81 MG tablet Take by mouth.  . [DISCONTINUED] cholecalciferol (VITAMIN D) 1000 units tablet Take 1,000 Units by mouth daily.  . [DISCONTINUED] meloxicam (MOBIC) 7.5 MG tablet Take 1 tablet (7.5 mg total) by mouth daily. (Patient not taking: Reported on 07/19/2018)  . [DISCONTINUED] montelukast (SINGULAIR) 10 MG tablet TAKE 1 TABLET BY MOUTH DAILY FOR CHRONIC ALLERGIC RHINITIS/ASTHMA (Patient not taking: Reported on 07/19/2018)  . [EXPIRED]  ipratropium-albuterol (DUONEB) 0.5-2.5 (3) MG/3ML nebulizer solution 3 mL    No facility-administered encounter medications on file as of 07/19/2018.       Medical History: Past Medical History:  Diagnosis Date  . Asthma   . Coronary artery disease   . Hypertension      Vital Signs: BP 127/79 (BP Location: Left Arm, Patient Position: Sitting, Cuff Size: Normal)   Pulse 88   Temp (!) 96.3 F (35.7 C) (Oral)   Resp 16   Ht 5\' 8"  (1.727 m)   Wt 179 lb (81.2 kg)   SpO2 95%   BMI 27.22 kg/m    Review of Systems  Constitutional: Negative.  Negative  for chills, fatigue and unexpected weight change.  HENT: Positive for postnasal drip, sinus pressure and sinus pain. Negative for congestion, rhinorrhea, sneezing and sore throat.   Eyes: Negative for redness.  Respiratory: Negative.  Negative for cough, chest tightness and shortness of breath.   Cardiovascular: Negative.  Negative for chest pain and palpitations.  Gastrointestinal: Negative.  Negative for abdominal pain, constipation, diarrhea, nausea and vomiting.  Endocrine: Negative.   Genitourinary: Negative.  Negative for dysuria and frequency.  Musculoskeletal: Negative.  Negative for arthralgias, back pain, joint swelling and neck pain.  Skin: Negative.  Negative for rash.  Allergic/Immunologic: Negative.   Neurological: Negative.  Negative for tremors and numbness.  Hematological: Negative for adenopathy. Does not bruise/bleed easily.  Psychiatric/Behavioral: Negative.  Negative for behavioral problems, sleep disturbance and suicidal ideas. The patient is not nervous/anxious.     Physical Exam  Constitutional: He is oriented to person, place, and time. He appears well-developed and well-nourished. No distress.  HENT:  Head: Normocephalic and atraumatic.  Mouth/Throat: Oropharynx is clear and moist. No oropharyngeal exudate.  Mild OP erythema, no sore throat.  Eyes: Pupils are equal, round, and reactive to light. EOM are normal.  Neck: Normal range of motion. Neck supple. No JVD present. No tracheal deviation present. No thyromegaly present.  Cardiovascular: Normal rate, regular rhythm and normal heart sounds. Exam reveals no gallop and no friction rub.  No murmur heard. Pulmonary/Chest: Effort normal. No respiratory distress. He has no wheezes. He has no rales. He exhibits no tenderness.  Lung sounds diminished bilaterally in bases.  Abdominal: Soft. There is no tenderness. There is no guarding.  Musculoskeletal: Normal range of motion.  Lymphadenopathy:    He has no cervical  adenopathy.  Neurological: He is alert and oriented to person, place, and time. No cranial nerve deficit.  Skin: Skin is warm and dry. He is not diaphoretic.  Psychiatric: He has a normal mood and affect. His behavior is normal. Judgment and thought content normal.  Nursing note and vitals reviewed.  Assessment/Plan: 1. Upper respiratory tract infection, unspecified type Prescription for Levaquin and prednisone sent to patient's pharmacy.  Instructed patient and wife that if breathing became worse or he develop fever or any other new or worrisome symptoms he should go directly to the emergency room or follow-up in clinic if we are open. - levofloxacin (LEVAQUIN) 500 MG tablet; Take 1 tablet (500 mg total) by mouth daily.  Dispense: 10 tablet; Refill: 0 - predniSONE (DELTASONE) 10 MG tablet; Use per dose pack  Dispense: 21 tablet; Refill: 0  2. Abnormal lung sounds Patient given DuoNeb treatment in exam room.  Breath sounds improved after DuoNeb.  However patient reports he still feels the same shortness of breath.  Will chest x-ray to evaluate for abnormalities. - ipratropium-albuterol (DUONEB) 0.5-2.5 (  3) MG/3ML nebulizer solution 3 mL  3. Recurrent sinus infections Patient given course of Levaquin.  Encouraged him to take over-the-counter sinus medications to help dry up the drainage.  4. Cough Cough likely from postnasal drip as it gets worse when he lays down at night.  Encourage patient to continue medications to help dry up drainage.  General Counseling: agron swiney understanding of the findings of todays visit and agrees with plan of treatment. I have discussed any further diagnostic evaluation that may be needed or ordered today. We also reviewed his medications today. he has been encouraged to call the office with any questions or concerns that should arise related to todays visit.   Orders Placed This Encounter  Procedures  . DG Chest 2 View    Meds ordered this  encounter  Medications  . ipratropium-albuterol (DUONEB) 0.5-2.5 (3) MG/3ML nebulizer solution 3 mL  . levofloxacin (LEVAQUIN) 500 MG tablet    Sig: Take 1 tablet (500 mg total) by mouth daily.    Dispense:  10 tablet    Refill:  0  . predniSONE (DELTASONE) 10 MG tablet    Sig: Use per dose pack    Dispense:  21 tablet    Refill:  0    Time spent: 25 Minutes  This patient was seen by Orson Gear AGNP-C in Collaboration with Dr Lavera Guise as a part of collaborative care agreement.  Kendell Bane AGNP-C Internal Medicine

## 2018-07-22 ENCOUNTER — Other Ambulatory Visit: Payer: Self-pay | Admitting: Nurse Practitioner

## 2018-07-22 DIAGNOSIS — J3089 Other allergic rhinitis: Secondary | ICD-10-CM

## 2018-07-23 DIAGNOSIS — E782 Mixed hyperlipidemia: Secondary | ICD-10-CM | POA: Diagnosis not present

## 2018-07-30 ENCOUNTER — Telehealth: Payer: Self-pay

## 2018-07-30 NOTE — Telephone Encounter (Signed)
Pt called he still having bad cough he had cough med at home he going to try and called [place] back if he need pres

## 2018-08-06 DIAGNOSIS — Z87891 Personal history of nicotine dependence: Secondary | ICD-10-CM | POA: Diagnosis not present

## 2018-08-06 DIAGNOSIS — I513 Intracardiac thrombosis, not elsewhere classified: Secondary | ICD-10-CM | POA: Diagnosis not present

## 2018-08-06 DIAGNOSIS — I728 Aneurysm of other specified arteries: Secondary | ICD-10-CM | POA: Diagnosis not present

## 2018-08-06 DIAGNOSIS — I2510 Atherosclerotic heart disease of native coronary artery without angina pectoris: Secondary | ICD-10-CM | POA: Diagnosis not present

## 2018-08-06 DIAGNOSIS — Z951 Presence of aortocoronary bypass graft: Secondary | ICD-10-CM | POA: Diagnosis not present

## 2018-08-06 DIAGNOSIS — I7100 Dissection of unspecified site of aorta: Secondary | ICD-10-CM | POA: Diagnosis not present

## 2018-08-06 DIAGNOSIS — I7101 Dissection of thoracic aorta: Secondary | ICD-10-CM | POA: Diagnosis not present

## 2018-08-08 ENCOUNTER — Encounter: Payer: Self-pay | Admitting: Nurse Practitioner

## 2018-08-08 ENCOUNTER — Ambulatory Visit (INDEPENDENT_AMBULATORY_CARE_PROVIDER_SITE_OTHER): Payer: Medicare Other | Admitting: Nurse Practitioner

## 2018-08-08 VITALS — BP 106/68 | HR 63 | Resp 16 | Ht 68.0 in | Wt 178.0 lb

## 2018-08-08 DIAGNOSIS — I10 Essential (primary) hypertension: Secondary | ICD-10-CM | POA: Diagnosis not present

## 2018-08-08 DIAGNOSIS — R05 Cough: Secondary | ICD-10-CM

## 2018-08-08 DIAGNOSIS — J329 Chronic sinusitis, unspecified: Secondary | ICD-10-CM

## 2018-08-08 DIAGNOSIS — R059 Cough, unspecified: Secondary | ICD-10-CM

## 2018-08-08 DIAGNOSIS — J3089 Other allergic rhinitis: Secondary | ICD-10-CM

## 2018-08-08 MED ORDER — PREDNISONE 10 MG (48) PO TBPK: ORAL_TABLET | ORAL | 0 refills | Status: DC

## 2018-08-08 MED ORDER — PROMETHAZINE-CODEINE 6.25-10 MG/5ML PO SYRP: 5.0000 mL | ORAL_SOLUTION | Freq: Four times a day (QID) | ORAL | 0 refills | Status: DC | PRN

## 2018-08-08 MED ORDER — CEFUROXIME AXETIL 500 MG PO TABS: 500.0000 mg | ORAL_TABLET | Freq: Two times a day (BID) | ORAL | 0 refills | Status: DC

## 2018-08-08 NOTE — Progress Notes (Signed)
[facility] [place], [place] [address]  Internal MEDICINE  Office Visit Note  Patient Name: James Mathews  [id number]  [id number]  Date of Service: 08/12/2018  Chief Complaint  Patient presents with  . Cough  . Hypertension  . Sinusitis  . Asthma    The patient is here for follow up exam. He is c/o cough, congestion, wheezing, and nasal congestion. Was seen for this 07/19/2018. Was treated with levofloxacin and steroid taper. Felt minimally better after treatment, but symptoms immediately worsened after he completed treatment. He also has lack of appetite and nausea. Denies chest pain or chest pressure. Has seen cardiologist and a-fib is sable.       Current Medication: Outpatient Encounter Medications as of 08/08/2018  Medication Sig  . acetaminophen (TYLENOL) 325 MG tablet Take 650 mg by mouth every 6 (six) hours as needed.  Marland Kitchen albuterol (PROVENTIL HFA;VENTOLIN HFA) 108 (90 Base) MCG/ACT inhaler Inhale into the lungs every 6 (six) hours as needed for wheezing or shortness of breath.  Marland Kitchen amLODipine (NORVASC) 10 MG tablet Take 10 mg by mouth daily.  Marland Kitchen aspirin 325 MG EC tablet Take 325 mg by mouth daily.  . Calcium-Magnesium-Vitamin D 400-166.7-133.3 MG-MG-UNIT TABS Take by mouth.  . cholecalciferol (VITAMIN D3) 10 MCG (400 UNIT) TABS tablet Take 400 Units by mouth.  . cyanocobalamin 1000 MCG tablet Take 1,000 mcg by mouth daily.  Marland Kitchen desloratadine (CLARINEX) 5 MG tablet TAKE 1 TABLET(5 MG) BY MOUTH DAILY  . fluticasone (FLONASE) 50 MCG/ACT nasal spray Place 2 sprays into both nostrils daily.  . montelukast (SINGULAIR) 10 MG tablet TAKE 1 TABLET BY MOUTH DAILY FOR CHRONIC ALLERGIC RHINITIS/ASTHMA  . Omega-3 Fatty Acids (FISH OIL PO) Take by mouth.  . pantoprazole (PROTONIX) 40 MG tablet Take 1 tablet (40 mg total) by mouth daily.  . simvastatin (ZOCOR) 40 MG tablet TAKE 1 TABLET BY MOUTH EVERY NIGHT AT BEDTIME  . vitamin C (ASCORBIC ACID) 500 MG tablet  Take 500 mg by mouth daily.  . vitamin E 400 UNIT capsule Take 400 Units by mouth daily.  . cefUROXime (CEFTIN) 500 MG tablet Take 1 tablet (500 mg total) by mouth 2 (two) times daily with a meal.  . guaiFENesin-codeine (ROBITUSSIN AC) 100-10 MG/5ML syrup Take 2 tsp 3 times a day as needed  . levofloxacin (LEVAQUIN) 500 MG tablet Take 1 tablet (500 mg total) by mouth daily. (Patient not taking: Reported on 08/08/2018)  . predniSONE (STERAPRED UNI-PAK 48 TAB) 10 MG (48) TBPK tablet 12 day taper - take by mouth as directed for 12 days  . promethazine-codeine (PHENERGAN WITH CODEINE) 6.25-10 MG/5ML syrup Take 5 mLs by mouth every 6 (six) hours as needed for cough.  . [DISCONTINUED] cefUROXime (CEFTIN) 500 MG tablet Take 1 tablet (500 mg total) by mouth 2 (two) times daily with a meal. (Patient not taking: Reported on 08/08/2018)  . [DISCONTINUED] lisinopril (PRINIVIL,ZESTRIL) 2.5 MG tablet Take 1 tablet (2.5 mg total) by mouth daily. (Patient not taking: Reported on 08/08/2018)  . [DISCONTINUED] oxyCODONE (OXY IR/ROXICODONE) 5 MG immediate release tablet Take 5 mg by mouth every 4 (four) hours as needed for severe pain.  . [DISCONTINUED] polyethylene glycol (MIRALAX / GLYCOLAX) packet Take 17 g by mouth daily.  . [DISCONTINUED] predniSONE (DELTASONE) 10 MG tablet Use per dose pack  . [DISCONTINUED] predniSONE (STERAPRED UNI-PAK 48 TAB) 10 MG (48) TBPK tablet 12 day taper - take by mouth as directed for 12 days (Patient not taking:  Reported on 07/19/2018)  . [DISCONTINUED] senna-docusate (SENOKOT-S) 8.6-50 MG tablet Take 1 tablet by mouth daily.   No facility-administered encounter medications on file as of 08/08/2018.     Surgical History: Past Surgical History:  Procedure Laterality Date  . cartaract surgery both eye Bilateral   . CORONARY ARTERY BYPASS GRAFT Bilateral   . CYSTOSCOPY    . HERNIA REPAIR    . knee replacment Bilateral   . retina surgery on right eye  Right 05/16/2017  .  TONSILLECTOMY Bilateral     Medical History: Past Medical History:  Diagnosis Date  . Asthma   . Coronary artery disease   . Hypertension     Family History: History reviewed. No pertinent family history.  Social History   Socioeconomic History  . Marital status: Married    Spouse name: Not on file  . Number of children: Not on file  . Years of education: Not on file  . Highest education level: Not on file  Occupational History  . Not on file  Social Needs  . Financial resource strain: Not on file  . Food insecurity:    Worry: Not on file    Inability: Not on file  . Transportation needs:    Medical: Not on file    Non-medical: Not on file  Tobacco Use  . Smoking status: Never Smoker  . Smokeless tobacco: Never Used  Substance and Sexual Activity  . Alcohol use: No    Frequency: Never  . Drug use: No  . Sexual activity: Not on file  Lifestyle  . Physical activity:    Days per week: Not on file    Minutes per session: Not on file  . Stress: Not on file  Relationships  . Social connections:    Talks on phone: Not on file    Gets together: Not on file    Attends religious service: Not on file    Active member of club or organization: Not on file    Attends meetings of clubs or organizations: Not on file    Relationship status: Not on file  . Intimate partner violence:    Fear of current or ex partner: Not on file    Emotionally abused: Not on file    Physically abused: Not on file    Forced sexual activity: Not on file  Other Topics Concern  . Not on file  Social History Narrative  . Not on file      Review of Systems  Constitutional: Positive for activity change and fatigue. Negative for chills, diaphoresis, fever and unexpected weight change.  HENT: Positive for congestion, postnasal drip, rhinorrhea, sinus pressure, sinus pain and voice change. Negative for ear pain, sneezing and sore throat.   Eyes: Negative.   Respiratory: Positive for cough,  chest tightness and wheezing. Negative for shortness of breath.   Cardiovascular: Negative for chest pain and palpitations.  Gastrointestinal: Positive for nausea. Negative for abdominal pain, constipation, diarrhea and vomiting.  Endocrine: Negative for cold intolerance, heat intolerance, polydipsia and polyuria.  Genitourinary: Negative.   Musculoskeletal: Positive for arthralgias and myalgias. Negative for back pain, joint swelling and neck pain.  Skin: Negative for rash.  Allergic/Immunologic: Positive for environmental allergies.  Neurological: Positive for dizziness and headaches. Negative for tremors and numbness.  Hematological: Positive for adenopathy. Does not bruise/bleed easily.  Psychiatric/Behavioral: Negative for behavioral problems (Depression), sleep disturbance and suicidal ideas. The patient is not nervous/anxious.     Today's Vitals   08/08/18  1118  BP: 106/68  Pulse: 63  Resp: 16  SpO2: 97%  Weight: 178 lb (80.7 kg)  Height: 5\' 8"  (1.727 m)    Physical Exam  Constitutional: He is oriented to person, place, and time. He appears well-developed and well-nourished. No distress.  HENT:  Head: Normocephalic and atraumatic.  Right Ear: Tympanic membrane is bulging. Tympanic membrane is not erythematous.  Left Ear: Tympanic membrane is bulging. Tympanic membrane is not erythematous.  Nose: Rhinorrhea present. Right sinus exhibits frontal sinus tenderness. Left sinus exhibits frontal sinus tenderness.  Mouth/Throat: Posterior oropharyngeal erythema present. No oropharyngeal exudate.  Eyes: Pupils are equal, round, and reactive to light. EOM are normal.  Neck: Normal range of motion. Neck supple. No JVD present. No tracheal deviation present. No thyromegaly present.  Cardiovascular: Normal rate, regular rhythm and normal heart sounds. Exam reveals no gallop and no friction rub.  No murmur heard. Pulmonary/Chest: Effort normal. No respiratory distress. He has no wheezes.  He has no rales. He exhibits no tenderness.  Harsh, dry cough present. Breath sounds are diminished throughout the lung fields   Abdominal: Soft. Bowel sounds are normal. There is no tenderness.  Musculoskeletal: Normal range of motion.  Lymphadenopathy:    He has cervical adenopathy.  Neurological: He is alert and oriented to person, place, and time. No cranial nerve deficit.  Skin: Skin is warm and dry. He is not diaphoretic.  Psychiatric: He has a normal mood and affect. His behavior is normal. Judgment and thought content normal.  Nursing note and vitals reviewed.  Assessment/Plan: 1. Recurrent sinus infections Start ceftin 500mg  twice daily for 14 days. Add prednisone taper - take as directed for 12 days. Rest and increase fluids. Take OTC medications as needed and as indicated to alleviate symptoms.  - cefUROXime (CEFTIN) 500 MG tablet; Take 1 tablet (500 mg total) by mouth 2 (two) times daily with a meal.  Dispense: 28 tablet; Refill: 0 - predniSONE (STERAPRED UNI-PAK 48 TAB) 10 MG (48) TBPK tablet; 12 day taper - take by mouth as directed for 12 days  Dispense: 48 tablet; Refill: 0  2. Cough May take promethazine/codeine cough suppressant every 6 hours as needed for cough. Advised he take this with caution, as it may cause dizziness or drowsiness.  - promethazine-codeine (PHENERGAN WITH CODEINE) 6.25-10 MG/5ML syrup; Take 5 mLs by mouth every 6 (six) hours as needed for cough.  Dispense: 200 mL; Refill: 0  3. Non-seasonal allergic rhinitis, unspecified trigger Continue to take clarinex and singulair as prescribed   4. Benign essential hypertension Stable. Continue bp medication as prescribed   General Counseling: tyrease vandeberg understanding of the findings of todays visit and agrees with plan of treatment. I have discussed any further diagnostic evaluation that may be needed or ordered today. We also reviewed his medications today. he has been encouraged to call the office  with any questions or concerns that should arise related to todays visit.  Rest and increase fluids. Continue using OTC medication to control symptoms.   This patient was seen by [place] with Dr Lavera Guise as a part of collaborative care agreement  Meds ordered this encounter  Medications  . cefUROXime (CEFTIN) 500 MG tablet    Sig: Take 1 tablet (500 mg total) by mouth 2 (two) times daily with a meal.    Dispense:  28 tablet    Refill:  0    Order Specific Question:   Supervising Provider    Answer:  KHAN, FOZIA M [[id number]]  . predniSONE (STERAPRED UNI-PAK 48 TAB) 10 MG (48) TBPK tablet    Sig: 12 day taper - take by mouth as directed for 12 days    Dispense:  48 tablet    Refill:  0    Order Specific Question:   Supervising Provider    Answer:   Lavera Guise [place]  . promethazine-codeine (PHENERGAN WITH CODEINE) 6.25-10 MG/5ML syrup    Sig: Take 5 mLs by mouth every 6 (six) hours as needed for cough.    Dispense:  200 mL    Refill:  0    Order Specific Question:   Supervising Provider    Answer:   Lavera Guise [[id number]]    Time spent: 83 Minutes      Dr Lavera Guise Internal medicine

## 2018-08-20 DIAGNOSIS — H2701 Aphakia, right eye: Secondary | ICD-10-CM | POA: Diagnosis not present

## 2018-08-22 ENCOUNTER — Other Ambulatory Visit: Payer: Self-pay

## 2018-08-22 DIAGNOSIS — J3089 Other allergic rhinitis: Secondary | ICD-10-CM

## 2018-08-22 MED ORDER — DESLORATADINE 5 MG PO TABS: ORAL_TABLET | ORAL | 3 refills | Status: DC

## 2018-09-24 ENCOUNTER — Other Ambulatory Visit: Payer: Self-pay | Admitting: Nurse Practitioner

## 2018-09-24 DIAGNOSIS — K219 Gastro-esophageal reflux disease without esophagitis: Secondary | ICD-10-CM

## 2018-09-24 MED ORDER — PANTOPRAZOLE SODIUM 40 MG PO TBEC: 40.0000 mg | DELAYED_RELEASE_TABLET | Freq: Every day | ORAL | 3 refills | Status: DC

## 2018-09-25 ENCOUNTER — Other Ambulatory Visit: Payer: Self-pay

## 2018-09-25 DIAGNOSIS — K219 Gastro-esophageal reflux disease without esophagitis: Secondary | ICD-10-CM

## 2018-09-25 MED ORDER — PANTOPRAZOLE SODIUM 40 MG PO TBEC: 40.0000 mg | DELAYED_RELEASE_TABLET | Freq: Every day | ORAL | 1 refills | Status: DC

## 2018-09-27 DIAGNOSIS — I480 Paroxysmal atrial fibrillation: Secondary | ICD-10-CM | POA: Diagnosis not present

## 2018-09-27 DIAGNOSIS — I25810 Atherosclerosis of coronary artery bypass graft(s) without angina pectoris: Secondary | ICD-10-CM | POA: Diagnosis not present

## 2018-09-27 DIAGNOSIS — I7100 Dissection of unspecified site of aorta: Secondary | ICD-10-CM | POA: Diagnosis not present

## 2018-09-27 DIAGNOSIS — E782 Mixed hyperlipidemia: Secondary | ICD-10-CM | POA: Diagnosis not present

## 2018-10-08 ENCOUNTER — Encounter: Payer: Self-pay | Admitting: Adult Health

## 2018-10-08 ENCOUNTER — Ambulatory Visit (INDEPENDENT_AMBULATORY_CARE_PROVIDER_SITE_OTHER): Payer: Medicare Other | Admitting: Adult Health

## 2018-10-08 VITALS — BP 128/72 | HR 71 | Temp 97.8°F | Resp 16 | Ht 68.0 in | Wt 188.0 lb

## 2018-10-08 DIAGNOSIS — J0110 Acute frontal sinusitis, unspecified: Secondary | ICD-10-CM | POA: Diagnosis not present

## 2018-10-08 DIAGNOSIS — R05 Cough: Secondary | ICD-10-CM

## 2018-10-08 DIAGNOSIS — I10 Essential (primary) hypertension: Secondary | ICD-10-CM | POA: Diagnosis not present

## 2018-10-08 DIAGNOSIS — R059 Cough, unspecified: Secondary | ICD-10-CM

## 2018-10-08 MED ORDER — CEFUROXIME AXETIL 500 MG PO TABS: 500.0000 mg | ORAL_TABLET | Freq: Two times a day (BID) | ORAL | 0 refills | Status: DC

## 2018-10-08 MED ORDER — PREDNISONE 10 MG PO TABS: ORAL_TABLET | ORAL | 0 refills | Status: DC

## 2018-10-08 MED ORDER — PROMETHAZINE-CODEINE 6.25-10 MG/5ML PO SYRP: 5.0000 mL | ORAL_SOLUTION | Freq: Four times a day (QID) | ORAL | 0 refills | Status: DC | PRN

## 2018-10-08 NOTE — Patient Instructions (Signed)

## 2018-10-08 NOTE — Progress Notes (Signed)
[facility], [employer] [place], [place] [address]  Internal MEDICINE  Office Visit Note  Patient Name: James Mathews  [id number]  [id number]  Date of Service: 10/17/2018  Chief Complaint  Patient presents with  . Sinusitis    sudafed      HPI Pt is here for a sick visit.  Patient is here today reporting over a week of sinus pain and pressure.  He reports headache, cough, and sore throat in the morning that gets better throughout the day.  He denies any fever however he does remember a few episodes of chills.     Current Medication:  Outpatient Encounter Medications as of 10/08/2018  Medication Sig  . acetaminophen (TYLENOL) 325 MG tablet Take 650 mg by mouth every 6 (six) hours as needed.  Marland Kitchen albuterol (PROVENTIL HFA;VENTOLIN HFA) 108 (90 Base) MCG/ACT inhaler Inhale into the lungs every 6 (six) hours as needed for wheezing or shortness of breath.  Marland Kitchen amLODipine (NORVASC) 10 MG tablet Take 10 mg by mouth daily.  Marland Kitchen aspirin 325 MG EC tablet Take 325 mg by mouth daily.  Marland Kitchen atorvastatin (LIPITOR) 20 MG tablet Take 20 mg by mouth daily.  . Calcium-Magnesium-Vitamin D 400-166.7-133.3 MG-MG-UNIT TABS Take by mouth.  . cholecalciferol (VITAMIN D3) 10 MCG (400 UNIT) TABS tablet Take 400 Units by mouth.  . cyanocobalamin 1000 MCG tablet Take 1,000 mcg by mouth daily.  Marland Kitchen desloratadine (CLARINEX) 5 MG tablet TAKE 1 TABLET(5 MG) BY MOUTH DAILY  . fluticasone (FLONASE) 50 MCG/ACT nasal spray Place 2 sprays into both nostrils daily.  . montelukast (SINGULAIR) 10 MG tablet TAKE 1 TABLET BY MOUTH DAILY FOR CHRONIC ALLERGIC RHINITIS/ASTHMA  . Omega-3 Fatty Acids (FISH OIL PO) Take by mouth.  . pantoprazole (PROTONIX) 40 MG tablet Take 1 tablet (40 mg total) by mouth daily.  . vitamin C (ASCORBIC ACID) 500 MG tablet Take 500 mg by mouth daily.  . cefUROXime (CEFTIN) 500 MG tablet Take 1 tablet (500 mg total) by mouth 2 (two) times daily with a meal.  . predniSONE (DELTASONE) 10  MG tablet Use per dose pack  . promethazine-codeine (PHENERGAN WITH CODEINE) 6.25-10 MG/5ML syrup Take 5 mLs by mouth every 6 (six) hours as needed for cough.  . [DISCONTINUED] cefUROXime (CEFTIN) 500 MG tablet Take 1 tablet (500 mg total) by mouth 2 (two) times daily with a meal. (Patient not taking: Reported on 10/08/2018)  . [DISCONTINUED] guaiFENesin-codeine (ROBITUSSIN AC) 100-10 MG/5ML syrup Take 2 tsp 3 times a day as needed  . [DISCONTINUED] levofloxacin (LEVAQUIN) 500 MG tablet Take 1 tablet (500 mg total) by mouth daily. (Patient not taking: Reported on 08/08/2018)  . [DISCONTINUED] predniSONE (STERAPRED UNI-PAK 48 TAB) 10 MG (48) TBPK tablet 12 day taper - take by mouth as directed for 12 days (Patient not taking: Reported on 10/08/2018)  . [DISCONTINUED] promethazine-codeine (PHENERGAN WITH CODEINE) 6.25-10 MG/5ML syrup Take 5 mLs by mouth every 6 (six) hours as needed for cough. (Patient not taking: Reported on 10/08/2018)  . [DISCONTINUED] simvastatin (ZOCOR) 40 MG tablet TAKE 1 TABLET BY MOUTH EVERY NIGHT AT BEDTIME  . [DISCONTINUED] vitamin E 400 UNIT capsule Take 400 Units by mouth daily.   No facility-administered encounter medications on file as of 10/08/2018.       Medical History: Past Medical History:  Diagnosis Date  . Asthma   . Atrial fibrillation ([place])   . Coronary artery disease   . Hypertension      Vital Signs: BP 128/72  Pulse 71   Temp 97.8 F (36.6 C) (Oral)   Resp 16   Ht 5\' 8"  (1.727 m)   Wt 188 lb (85.3 kg)   SpO2 97%   BMI 28.59 kg/m    Review of Systems  Constitutional: Positive for fatigue. Negative for chills and unexpected weight change.  HENT: Positive for postnasal drip, sinus pressure, sinus pain and sore throat. Negative for congestion, rhinorrhea and sneezing.   Eyes: Negative for redness.  Respiratory: Negative.  Negative for cough, chest tightness and shortness of breath.   Cardiovascular: Negative.  Negative for chest pain and  palpitations.  Gastrointestinal: Negative.  Negative for abdominal pain, constipation, diarrhea, nausea and vomiting.  Endocrine: Negative.   Genitourinary: Negative.  Negative for dysuria and frequency.  Musculoskeletal: Negative.  Negative for arthralgias, back pain, joint swelling and neck pain.  Skin: Negative.  Negative for rash.  Allergic/Immunologic: Negative.   Neurological: Negative.  Negative for tremors and numbness.  Hematological: Negative for adenopathy. Does not bruise/bleed easily.  Psychiatric/Behavioral: Negative.  Negative for behavioral problems, sleep disturbance and suicidal ideas. The patient is not nervous/anxious.     Physical Exam Vitals signs and nursing note reviewed.  Constitutional:      General: He is not in acute distress.    Appearance: He is well-developed. He is not diaphoretic.  HENT:     Head: Normocephalic and atraumatic.     Mouth/Throat:     Pharynx: No oropharyngeal exudate.  Eyes:     Pupils: Pupils are equal, round, and reactive to light.  Neck:     Musculoskeletal: Normal range of motion and neck supple.     Thyroid: No thyromegaly.     Vascular: No JVD.     Trachea: No tracheal deviation.  Cardiovascular:     Rate and Rhythm: Normal rate and regular rhythm.     Heart sounds: Normal heart sounds. No murmur. No friction rub. No gallop.   Pulmonary:     Effort: Pulmonary effort is normal. No respiratory distress.     Breath sounds: Normal breath sounds. No wheezing or rales.  Chest:     Chest wall: No tenderness.  Abdominal:     Palpations: Abdomen is soft.     Tenderness: There is no abdominal tenderness. There is no guarding.  Musculoskeletal: Normal range of motion.  Lymphadenopathy:     Cervical: No cervical adenopathy.  Skin:    General: Skin is warm and dry.  Neurological:     Mental Status: He is alert and oriented to person, place, and time.     Cranial Nerves: No cranial nerve deficit.  Psychiatric:        Behavior:  Behavior normal.        Thought Content: Thought content normal.        Judgment: Judgment normal.    Assessment/Plan: 1. Acute non-recurrent frontal sinusitis Patient provided with prednisone taper as well as Ceftin for sinusitis.  Patient will take medication as prescribed and return to clinic 7 to 10 days if symptoms fail to improve. - predniSONE (DELTASONE) 10 MG tablet; Use per dose pack  Dispense: 21 tablet; Refill: 0 - cefUROXime (CEFTIN) 500 MG tablet; Take 1 tablet (500 mg total) by mouth 2 (two) times daily with a meal.  Dispense: 28 tablet; Refill: 0  2. Cough Patient reported was having trouble sleeping at night due to cough.  Prescribed Phenergan with codeine cough syrup instructed patient on safety when taking this medication.  This medication  should not be mixed with any other [place] medications. - promethazine-codeine (PHENERGAN WITH CODEINE) 6.25-10 MG/5ML syrup; Take 5 mLs by mouth every 6 (six) hours as needed for cough.  Dispense: 120 mL; Refill: 0 Reviewed risks and possible side effects associated with taking opiates, benzodiazepines and other CNS depressants. Combination of these could cause dizziness and drowsiness. Advised patient not to drive or operate machinery when taking these medications, as patient's and other's life can be at risk and will have consequences. Patient verbalized understanding in this matter. Dependence and abuse for these drugs will be monitored closely. A Controlled substance policy and procedure is on file which allows [facility] to order a urine drug screen test at any visit. Patient understands and agrees with the plan  3. Benign essential hypertension Stable, continue current medications.  General Counseling: gustavo dispenza understanding of the findings of todays visit and agrees with plan of treatment. I have discussed any further diagnostic evaluation that may be needed or ordered today. We also reviewed his medications today.  he has been encouraged to call the office with any questions or concerns that should arise related to todays visit.   No orders of the defined types were placed in this encounter.   Meds ordered this encounter  Medications  . predniSONE (DELTASONE) 10 MG tablet    Sig: Use per dose pack    Dispense:  21 tablet    Refill:  0  . promethazine-codeine (PHENERGAN WITH CODEINE) 6.25-10 MG/5ML syrup    Sig: Take 5 mLs by mouth every 6 (six) hours as needed for cough.    Dispense:  120 mL    Refill:  0  . cefUROXime (CEFTIN) 500 MG tablet    Sig: Take 1 tablet (500 mg total) by mouth 2 (two) times daily with a meal.    Dispense:  28 tablet    Refill:  0    Time spent: 25 Minutes  This patient was seen by Orson Gear AGNP-C in Collaboration with Dr Lavera Guise as a part of collaborative care agreement.  Kendell Bane AGNP-C Internal Medicine

## 2018-10-23 DIAGNOSIS — I10 Essential (primary) hypertension: Secondary | ICD-10-CM | POA: Diagnosis not present

## 2018-10-23 DIAGNOSIS — I25810 Atherosclerosis of coronary artery bypass graft(s) without angina pectoris: Secondary | ICD-10-CM | POA: Diagnosis not present

## 2018-10-23 DIAGNOSIS — E782 Mixed hyperlipidemia: Secondary | ICD-10-CM | POA: Diagnosis not present

## 2018-10-23 DIAGNOSIS — I480 Paroxysmal atrial fibrillation: Secondary | ICD-10-CM | POA: Diagnosis not present

## 2018-12-10 ENCOUNTER — Encounter: Payer: Self-pay | Admitting: Nurse Practitioner

## 2018-12-10 ENCOUNTER — Ambulatory Visit (INDEPENDENT_AMBULATORY_CARE_PROVIDER_SITE_OTHER): Payer: Medicare Other | Admitting: Nurse Practitioner

## 2018-12-10 ENCOUNTER — Other Ambulatory Visit: Payer: Self-pay

## 2018-12-10 VITALS — BP 127/85 | HR 74 | Temp 97.7°F | Resp 16 | Ht 68.0 in | Wt 188.0 lb

## 2018-12-10 DIAGNOSIS — J0110 Acute frontal sinusitis, unspecified: Secondary | ICD-10-CM | POA: Insufficient documentation

## 2018-12-10 DIAGNOSIS — J3089 Other allergic rhinitis: Secondary | ICD-10-CM | POA: Diagnosis not present

## 2018-12-10 DIAGNOSIS — M7989 Other specified soft tissue disorders: Secondary | ICD-10-CM

## 2018-12-10 MED ORDER — PREDNISONE 10 MG PO TABS: ORAL_TABLET | ORAL | 0 refills | Status: DC

## 2018-12-10 MED ORDER — LEVOFLOXACIN 500 MG PO TABS: 500.0000 mg | ORAL_TABLET | Freq: Every day | ORAL | 0 refills | Status: DC

## 2018-12-10 NOTE — Progress Notes (Addendum)
[facility] [place], [place] [address]  Internal MEDICINE  Office Visit Note  Patient Name: James Mathews  [id number]  [id number]  Date of Service: 12/10/2018   Pt is here for a sick visit.  Chief Complaint  Patient presents with  . Cough    no fever ,no body aches,no mucus   . Sinusitis  . Ear Problem    left ear   . Leg Swelling    right  about week     The patient has been contacted via telephone for sick visit due to concerns for spread of novel coronavirus. The patient has congestion, headache, left ear pain, and cough. Symptoms have been present for several days. He has no fever. Denies nausea or vomiting. . States that he has not been exposed to anyone who has been positive for COVID 19.  He also states that he has had right leg swelling for a little over a week. States that this is not painful or red. Swelling is not pitting. There is no redness or warmth associated with the swelling. He states that it does not get better when he rests at night. He did contact the cardiologist office. He spoke with a nurse and was told this did not sound like a blood clot and that he should continue to monitor for worsening symptoms        Current Medication:  Outpatient Encounter Medications as of 12/10/2018  Medication Sig  . acetaminophen (TYLENOL) 325 MG tablet Take 650 mg by mouth every 6 (six) hours as needed.  Marland Kitchen albuterol (PROVENTIL HFA;VENTOLIN HFA) 108 (90 Base) MCG/ACT inhaler Inhale into the lungs every 6 (six) hours as needed for wheezing or shortness of breath.  Marland Kitchen amLODipine (NORVASC) 10 MG tablet Take 10 mg by mouth daily.  Marland Kitchen aspirin 325 MG EC tablet Take 325 mg by mouth daily.  Marland Kitchen atorvastatin (LIPITOR) 20 MG tablet Take 20 mg by mouth daily.  . Calcium-Magnesium-Vitamin D 400-166.7-133.3 MG-MG-UNIT TABS Take by mouth.  . cefUROXime (CEFTIN) 500 MG tablet Take 1 tablet (500 mg total) by mouth 2 (two) times daily with a meal.  .  cholecalciferol (VITAMIN D3) 10 MCG (400 UNIT) TABS tablet Take 400 Units by mouth.  . cyanocobalamin 1000 MCG tablet Take 1,000 mcg by mouth daily.  Marland Kitchen desloratadine (CLARINEX) 5 MG tablet TAKE 1 TABLET(5 MG) BY MOUTH DAILY  . fluticasone (FLONASE) 50 MCG/ACT nasal spray Place 2 sprays into both nostrils daily.  . montelukast (SINGULAIR) 10 MG tablet TAKE 1 TABLET BY MOUTH DAILY FOR CHRONIC ALLERGIC RHINITIS/ASTHMA  . Omega-3 Fatty Acids (FISH OIL PO) Take by mouth.  . pantoprazole (PROTONIX) 40 MG tablet Take 1 tablet (40 mg total) by mouth daily.  . predniSONE (DELTASONE) 10 MG tablet Take by mouth as directed.  . promethazine-codeine (PHENERGAN WITH CODEINE) 6.25-10 MG/5ML syrup Take 5 mLs by mouth every 6 (six) hours as needed for cough.  . vitamin C (ASCORBIC ACID) 500 MG tablet Take 500 mg by mouth daily.  . [DISCONTINUED] predniSONE (DELTASONE) 10 MG tablet Use per dose pack  . levofloxacin (LEVAQUIN) 500 MG tablet Take 1 tablet (500 mg total) by mouth daily.   No facility-administered encounter medications on file as of 12/10/2018.       Medical History: Past Medical History:  Diagnosis Date  . Asthma   . Atrial fibrillation ([place])   . Coronary artery disease   . Hypertension      Today's Vitals  12/10/18 1245  BP: 127/85  Pulse: 74  Resp: 16  Temp: 97.7 F (36.5 C)  Weight: 188 lb (85.3 kg)  Height: 5\' 8"  (1.727 m)   Body mass index is 28.59 kg/m.  Review of Systems  Constitutional: Positive for fatigue. Negative for chills, fever and unexpected weight change.  HENT: Positive for congestion, ear pain, postnasal drip, rhinorrhea, sinus pain and sore throat. Negative for sneezing.   Respiratory: Positive for cough. Negative for chest tightness and shortness of breath.   Cardiovascular: Positive for leg swelling. Negative for chest pain and palpitations.  Gastrointestinal: Negative for abdominal pain, constipation, diarrhea, nausea and vomiting.  Musculoskeletal:  Negative for arthralgias, back pain, joint swelling and neck pain.  Skin: Negative for rash.  Neurological: Positive for headaches. Negative for tremors and numbness.  Hematological: Positive for adenopathy. Does not bruise/bleed easily.  Psychiatric/Behavioral: Negative for behavioral problems (Depression), sleep disturbance and suicidal ideas. The patient is not nervous/anxious.     Assessment/Plan:  1. Acute non-recurrent frontal sinusitis Start levofloxacin 500mg  daily for 10 days. Advised him to rest and increase fluids. Continue to take OTC medications as needed and as indicated.  - levofloxacin (LEVAQUIN) 500 MG tablet; Take 1 tablet (500 mg total) by mouth daily.  Dispense: 10 tablet; Refill: 0 - predniSONE (DELTASONE) 10 MG tablet; Take by mouth as directed.  Dispense: 21 tablet; Refill: 0  2. Non-seasonal allergic rhinitis, unspecified trigger Take all allergy medications as prescribed   3. Leg swelling Agree with cardiology. This does not sound consistent with blood clot. He should continue to monitor this closely at home and notify the office or cardiology visit if symptoms do not improve or get worse    General Counseling: jamier urbas understanding of the findings of todays visit and agrees with plan of treatment. I have discussed any further diagnostic evaluation that may be needed or ordered today. We also reviewed his medications today. he has been encouraged to call the office with any questions or concerns that should arise related to todays visit.    Counseling:  Rest and increase fluids. Continue using OTC medication to control symptoms.   This patient was seen by [place] with Dr Lavera Guise as a part of collaborative care agreement  Meds ordered this encounter  Medications  . levofloxacin (LEVAQUIN) 500 MG tablet    Sig: Take 1 tablet (500 mg total) by mouth daily.    Dispense:  10 tablet    Refill:  0    Order Specific  Question:   Supervising Provider    Answer:   Lavera Guise [[id number]]  . predniSONE (DELTASONE) 10 MG tablet    Sig: Take by mouth as directed.    Dispense:  21 tablet    Refill:  0    Order Specific Question:   Supervising Provider    Answer:   Lavera Guise [[id number]]    Time spent: 25 Minutes

## 2018-12-12 NOTE — Addendum Note (Signed)
Addended by: Leretha Pol on: 12/12/2018 05:39 PM   Modules accepted: Level of Service

## 2018-12-13 ENCOUNTER — Other Ambulatory Visit: Payer: Self-pay

## 2018-12-13 MED ORDER — MONTELUKAST SODIUM 10 MG PO TABS: ORAL_TABLET | ORAL | 0 refills | Status: DC

## 2018-12-17 ENCOUNTER — Other Ambulatory Visit: Payer: Self-pay

## 2018-12-17 DIAGNOSIS — J3089 Other allergic rhinitis: Secondary | ICD-10-CM

## 2018-12-17 MED ORDER — DESLORATADINE 5 MG PO TABS: ORAL_TABLET | ORAL | 3 refills | Status: DC

## 2019-01-08 ENCOUNTER — Other Ambulatory Visit: Payer: Self-pay

## 2019-01-08 DIAGNOSIS — J0110 Acute frontal sinusitis, unspecified: Secondary | ICD-10-CM

## 2019-01-08 MED ORDER — PREDNISONE 10 MG (48) PO TBPK: ORAL_TABLET | ORAL | 0 refills | Status: AC

## 2019-01-08 MED ORDER — LEVOFLOXACIN 500 MG PO TABS: ORAL_TABLET | ORAL | 0 refills | Status: DC

## 2019-01-08 NOTE — Telephone Encounter (Signed)
Pt called today he was since 3/30 for sinus infection still coughing and head cold as per heather send another round for prednisone and levaquin if not feeling better we can see him to telephone visit

## 2019-02-06 ENCOUNTER — Other Ambulatory Visit: Payer: Self-pay

## 2019-02-06 MED ORDER — MONTELUKAST SODIUM 10 MG PO TABS: ORAL_TABLET | ORAL | 5 refills | Status: DC

## 2019-02-07 ENCOUNTER — Other Ambulatory Visit: Payer: Self-pay

## 2019-02-07 ENCOUNTER — Encounter: Payer: Self-pay | Admitting: Adult Health

## 2019-02-07 ENCOUNTER — Ambulatory Visit (INDEPENDENT_AMBULATORY_CARE_PROVIDER_SITE_OTHER): Payer: Medicare Other | Admitting: Adult Health

## 2019-02-07 VITALS — BP 108/70 | HR 81 | Resp 16 | Ht 68.0 in | Wt 190.0 lb

## 2019-02-07 DIAGNOSIS — R300 Dysuria: Secondary | ICD-10-CM | POA: Diagnosis not present

## 2019-02-07 DIAGNOSIS — K219 Gastro-esophageal reflux disease without esophagitis: Secondary | ICD-10-CM | POA: Diagnosis not present

## 2019-02-07 DIAGNOSIS — Z0001 Encounter for general adult medical examination with abnormal findings: Secondary | ICD-10-CM

## 2019-02-07 DIAGNOSIS — I10 Essential (primary) hypertension: Secondary | ICD-10-CM

## 2019-02-07 DIAGNOSIS — R05 Cough: Secondary | ICD-10-CM | POA: Diagnosis not present

## 2019-02-07 DIAGNOSIS — J4520 Mild intermittent asthma, uncomplicated: Secondary | ICD-10-CM

## 2019-02-07 DIAGNOSIS — R059 Cough, unspecified: Secondary | ICD-10-CM

## 2019-02-07 MED ORDER — BENZONATATE 100 MG PO CAPS: 100.0000 mg | ORAL_CAPSULE | Freq: Two times a day (BID) | ORAL | 0 refills | Status: DC | PRN

## 2019-02-07 MED ORDER — ALBUTEROL SULFATE HFA 108 (90 BASE) MCG/ACT IN AERS: 2.0000 | INHALATION_SPRAY | Freq: Four times a day (QID) | RESPIRATORY_TRACT | 2 refills | Status: DC | PRN

## 2019-02-07 NOTE — Progress Notes (Signed)
[facility] [place], [place] [address]  Internal MEDICINE  Office Visit Note  Patient Name: James Mathews  [id number]  [id number]  Date of Service: 02/07/2019  Chief Complaint  Patient presents with  . Annual Exam    medicare wellnes exam ,   . Hyperlipidemia  . Hypertension  . Asthma    have a cough for with drainage in the back of the throat , would like something for the cough, refill meds      HPI Pt is here for routine health maintenance examination.  Overall patient reports he is doing well however he does have cough spell at night when he lays down.  He reports he feels drainage in the back of his throat.  We discussed taking over-the-counter allergy medications.  He reports that his blood pressure and hyperlipidemia have been well controlled.  He denies any other complaints at this time.  Patient denies any alcohol, tobacco, or illicit drug use.  Current Medication: Outpatient Encounter Medications as of 02/07/2019  Medication Sig  . acetaminophen (TYLENOL) 325 MG tablet Take 650 mg by mouth every 6 (six) hours as needed.  Marland Kitchen albuterol (PROVENTIL HFA;VENTOLIN HFA) 108 (90 Base) MCG/ACT inhaler Inhale into the lungs every 6 (six) hours as needed for wheezing or shortness of breath.  Marland Kitchen amLODipine (NORVASC) 10 MG tablet Take 10 mg by mouth daily.  Marland Kitchen aspirin 325 MG EC tablet Take 325 mg by mouth daily.  Marland Kitchen atorvastatin (LIPITOR) 20 MG tablet Take 20 mg by mouth daily.  . Calcium-Magnesium-Vitamin D 400-166.7-133.3 MG-MG-UNIT TABS Take by mouth.  . cholecalciferol (VITAMIN D3) 10 MCG (400 UNIT) TABS tablet Take 400 Units by mouth.  . cyanocobalamin 1000 MCG tablet Take 1,000 mcg by mouth daily.  Marland Kitchen desloratadine (CLARINEX) 5 MG tablet TAKE 1 TABLET(5 MG) BY MOUTH DAILY  . montelukast (SINGULAIR) 10 MG tablet TAKE 1 TABLET BY MOUTH DAILY FOR CHRONIC ALLERGIC RHINITIS/ASTHMA  . pantoprazole (PROTONIX) 40 MG tablet Take 1 tablet (40 mg total) by mouth  daily.  . vitamin C (ASCORBIC ACID) 500 MG tablet Take 500 mg by mouth daily.  . fluticasone (FLONASE) 50 MCG/ACT nasal spray Place 2 sprays into both nostrils daily. (Patient not taking: Reported on 02/07/2019)  . [DISCONTINUED] cefUROXime (CEFTIN) 500 MG tablet Take 1 tablet (500 mg total) by mouth 2 (two) times daily with a meal. (Patient not taking: Reported on 02/07/2019)  . [DISCONTINUED] levofloxacin (LEVAQUIN) 500 MG tablet Take 1 tab po daily for acute sinusitis (Patient not taking: Reported on 02/07/2019)  . [DISCONTINUED] Omega-3 Fatty Acids (FISH OIL PO) Take by mouth.  . [DISCONTINUED] promethazine-codeine (PHENERGAN WITH CODEINE) 6.25-10 MG/5ML syrup Take 5 mLs by mouth every 6 (six) hours as needed for cough. (Patient not taking: Reported on 02/07/2019)   No facility-administered encounter medications on file as of 02/07/2019.     Surgical History: Past Surgical History:  Procedure Laterality Date  . cartaract surgery both eye Bilateral   . CORONARY ARTERY BYPASS GRAFT Bilateral   . CYSTOSCOPY    . HERNIA REPAIR    . knee replacment Bilateral   . retina surgery on right eye  Right 05/16/2017  . TONSILLECTOMY Bilateral     Medical History: Past Medical History:  Diagnosis Date  . Asthma   . Atrial fibrillation ([place])   . Coronary artery disease   . Hyperlipidemia   . Hypertension     Family History: No family history on file.    Review  of Systems  Constitutional: Negative.  Negative for chills, fatigue and unexpected weight change.  HENT: Positive for postnasal drip. Negative for congestion, rhinorrhea, sneezing and sore throat.   Eyes: Negative for redness.  Respiratory: Positive for cough. Negative for chest tightness and shortness of breath.   Cardiovascular: Negative.  Negative for chest pain and palpitations.  Gastrointestinal: Negative.  Negative for abdominal pain, constipation, diarrhea, nausea and vomiting.  Endocrine: Negative.   Genitourinary:  Negative.  Negative for dysuria and frequency.  Musculoskeletal: Negative.  Negative for arthralgias, back pain, joint swelling and neck pain.  Skin: Negative.  Negative for rash.  Allergic/Immunologic: Negative.   Neurological: Negative.  Negative for tremors and numbness.  Hematological: Negative for adenopathy. Does not bruise/bleed easily.  Psychiatric/Behavioral: Negative.  Negative for behavioral problems, sleep disturbance and suicidal ideas. The patient is not nervous/anxious.      Vital Signs: BP 108/70   Pulse 81   Resp 16   Ht 5\' 8"  (1.727 m)   Wt 190 lb (86.2 kg)   SpO2 97%   BMI 28.89 kg/m    Physical Exam Vitals signs and nursing note reviewed.  Constitutional:      General: He is not in acute distress.    Appearance: He is well-developed. He is not diaphoretic.  HENT:     Head: Normocephalic and atraumatic.     Mouth/Throat:     Pharynx: No oropharyngeal exudate.  Eyes:     Pupils: Pupils are equal, round, and reactive to light.  Neck:     Musculoskeletal: Normal range of motion and neck supple.     Thyroid: No thyromegaly.     Vascular: No JVD.     Trachea: No tracheal deviation.  Cardiovascular:     Rate and Rhythm: Normal rate and regular rhythm.     Heart sounds: Normal heart sounds. No murmur. No friction rub. No gallop.   Pulmonary:     Effort: Pulmonary effort is normal. No respiratory distress.     Breath sounds: Normal breath sounds. No wheezing or rales.  Chest:     Chest wall: No tenderness.  Abdominal:     Palpations: Abdomen is soft.     Tenderness: There is no abdominal tenderness. There is no guarding.  Musculoskeletal: Normal range of motion.  Lymphadenopathy:     Cervical: No cervical adenopathy.  Skin:    General: Skin is warm and dry.  Neurological:     Mental Status: He is alert and oriented to person, place, and time.     Cranial Nerves: No cranial nerve deficit.  Psychiatric:        Behavior: Behavior normal.         Thought Content: Thought content normal.        Judgment: Judgment normal.      LABS: No results found for this or any previous visit (from the past 2160 hour(s)).   Assessment/Plan: 1. Encounter for general adult medical examination with abnormal findings Patient is up-to-date on preventative health maintenance.  Labs ordered at this time. - CBC with Differential/Platelet - Lipid Panel With LDL/HDL Ratio - TSH - T4, free - Comprehensive metabolic panel - PSA  2. Gastroesophageal reflux disease without esophagitis Stable, continue present management.  3. Mild intermittent asthma without complication Asthmanex sample given to patient. - albuterol (VENTOLIN HFA) 108 (90 Base) MCG/ACT inhaler; Inhale 2 puffs into the lungs every 6 (six) hours as needed for wheezing or shortness of breath.  Dispense: 3 Inhaler; Refill:  2  4. Benign essential hypertension Stable, continue present management.  5. Cough Benzonatate prescribed to help patient with cough at night when he sleeps. - benzonatate (TESSALON) 100 MG capsule; Take 1 capsule (100 mg total) by mouth 2 (two) times daily as needed for cough.  Dispense: 20 capsule; Refill: 0  6. Dysuria - UA/M w/rflx Culture, Routine  General Counseling: yosgar demirjian understanding of the findings of todays visit and agrees with plan of treatment. I have discussed any further diagnostic evaluation that may be needed or ordered today. We also reviewed his medications today. he has been encouraged to call the office with any questions or concerns that should arise related to todays visit.   Orders Placed This Encounter  Procedures  . UA/M w/rflx Culture, Routine    No orders of the defined types were placed in this encounter.   Time spent: 25 Minutes   This patient was seen by Orson Gear Mathews in Collaboration with Dr Lavera Guise as a part of collaborative care agreement    James Mathews Internal Medicine

## 2019-02-08 LAB — MICROSCOPIC EXAMINATION
Bacteria, UA: NONE SEEN
Casts: NONE SEEN /lpf

## 2019-02-08 LAB — UA/M W/RFLX CULTURE, ROUTINE
Bilirubin, UA: NEGATIVE
Glucose, UA: NEGATIVE
Leukocytes,UA: NEGATIVE
Nitrite, UA: NEGATIVE
Protein,UA: NEGATIVE
RBC, UA: NEGATIVE
Specific Gravity, UA: 1.023 (ref 1.005–1.030)
Urobilinogen, Ur: 1 mg/dL (ref 0.2–1.0)
pH, UA: 7 (ref 5.0–7.5)

## 2019-02-12 DIAGNOSIS — H2701 Aphakia, right eye: Secondary | ICD-10-CM | POA: Diagnosis not present

## 2019-02-15 DIAGNOSIS — K219 Gastro-esophageal reflux disease without esophagitis: Secondary | ICD-10-CM | POA: Diagnosis not present

## 2019-02-15 DIAGNOSIS — Z0001 Encounter for general adult medical examination with abnormal findings: Secondary | ICD-10-CM | POA: Diagnosis not present

## 2019-02-16 LAB — COMPREHENSIVE METABOLIC PANEL
ALT: 13 IU/L (ref 0–44)
AST: 17 IU/L (ref 0–40)
Albumin/Globulin Ratio: 2.3 — ABNORMAL HIGH (ref 1.2–2.2)
Albumin: 4.5 g/dL (ref 3.7–4.7)
Alkaline Phosphatase: 59 IU/L (ref 39–117)
BUN/Creatinine Ratio: 13 (ref 10–24)
BUN: 11 mg/dL (ref 8–27)
Bilirubin Total: 0.5 mg/dL (ref 0.0–1.2)
CO2: 24 mmol/L (ref 20–29)
Calcium: 9.4 mg/dL (ref 8.6–10.2)
Chloride: 103 mmol/L (ref 96–106)
Creatinine, Ser: 0.88 mg/dL (ref 0.76–1.27)
GFR calc Af Amer: 94 mL/min/{1.73_m2} (ref >59)
GFR calc non Af Amer: 82 mL/min/{1.73_m2} (ref >59)
Globulin, Total: 2 g/dL (ref 1.5–4.5)
Glucose: 108 mg/dL — ABNORMAL HIGH (ref 65–99)
Potassium: 3.3 mmol/L — ABNORMAL LOW (ref 3.5–5.2)
Sodium: 142 mmol/L (ref 134–144)
Total Protein: 6.5 g/dL (ref 6.0–8.5)

## 2019-02-16 LAB — LIPID PANEL WITH LDL/HDL RATIO
Cholesterol, Total: 107 mg/dL (ref 100–199)
HDL: 40 mg/dL (ref >39)
LDL Calculated: 56 mg/dL (ref 0–99)
LDl/HDL Ratio: 1.4 ratio (ref 0.0–3.6)
Triglycerides: 57 mg/dL (ref 0–149)
VLDL Cholesterol Cal: 11 mg/dL (ref 5–40)

## 2019-02-16 LAB — CBC WITH DIFFERENTIAL/PLATELET
Basophils Absolute: 0 10*3/uL (ref 0.0–0.2)
Basos: 0 %
EOS (ABSOLUTE): 0.3 10*3/uL (ref 0.0–0.4)
Eos: 5 %
Hematocrit: 39.8 % (ref 37.5–51.0)
Hemoglobin: 13.7 g/dL (ref 13.0–17.7)
Immature Grans (Abs): 0 10*3/uL (ref 0.0–0.1)
Immature Granulocytes: 1 %
Lymphocytes Absolute: 3 10*3/uL (ref 0.7–3.1)
Lymphs: 45 %
MCH: 30.9 pg (ref 26.6–33.0)
MCHC: 34.4 g/dL (ref 31.5–35.7)
MCV: 90 fL (ref 79–97)
Monocytes Absolute: 0.7 10*3/uL (ref 0.1–0.9)
Monocytes: 11 %
Neutrophils Absolute: 2.5 10*3/uL (ref 1.4–7.0)
Neutrophils: 38 %
Platelets: 236 10*3/uL (ref 150–450)
RBC: 4.44 x10E6/uL (ref 4.14–5.80)
RDW: 14.6 % (ref 11.6–15.4)
WBC: 6.6 10*3/uL (ref 3.4–10.8)

## 2019-02-16 LAB — T4, FREE: Free T4: 1.04 ng/dL (ref 0.82–1.77)

## 2019-02-16 LAB — PSA: Prostate Specific Ag, Serum: 0.2 ng/mL (ref 0.0–4.0)

## 2019-02-16 LAB — TSH: TSH: 3.36 u[IU]/mL (ref 0.450–4.500)

## 2019-02-24 ENCOUNTER — Encounter: Payer: Self-pay | Admitting: Adult Health

## 2019-03-04 ENCOUNTER — Encounter: Payer: Self-pay | Admitting: Adult Health

## 2019-03-04 ENCOUNTER — Other Ambulatory Visit: Payer: Self-pay

## 2019-03-04 ENCOUNTER — Ambulatory Visit (INDEPENDENT_AMBULATORY_CARE_PROVIDER_SITE_OTHER): Payer: Medicare Other | Admitting: Adult Health

## 2019-03-04 VITALS — BP 124/82 | HR 80 | Resp 16 | Ht 68.0 in | Wt 193.0 lb

## 2019-03-04 DIAGNOSIS — K219 Gastro-esophageal reflux disease without esophagitis: Secondary | ICD-10-CM

## 2019-03-04 DIAGNOSIS — I10 Essential (primary) hypertension: Secondary | ICD-10-CM | POA: Diagnosis not present

## 2019-03-04 NOTE — Progress Notes (Signed)
[facility] [place], [place] [address]  Internal MEDICINE  Office Visit Note  Patient Name: James Mathews  [id number]  [id number]  Date of Service: 03/04/2019  Chief Complaint  Patient presents with  . Medical Management of Chronic Issues    3 week follow up labs     HPI Pt is here for follow up on lab results. Pt reports he continues to do well.  Denis any current complaints or issues.  WE discussed lab results and he denies questions at this time. Pt will have labs redrawn in 4-6 months to continue to observe potassium level and TSH       Current Medication: Outpatient Encounter Medications as of 03/04/2019  Medication Sig  . acetaminophen (TYLENOL) 325 MG tablet Take 650 mg by mouth every 6 (six) hours as needed.  Marland Kitchen albuterol (VENTOLIN HFA) 108 (90 Base) MCG/ACT inhaler Inhale 2 puffs into the lungs every 6 (six) hours as needed for wheezing or shortness of breath.  Marland Kitchen amLODipine (NORVASC) 10 MG tablet Take 10 mg by mouth daily.  Marland Kitchen aspirin 325 MG EC tablet Take 325 mg by mouth daily.  Marland Kitchen atorvastatin (LIPITOR) 20 MG tablet Take 20 mg by mouth daily.  . benzonatate (TESSALON) 100 MG capsule Take 1 capsule (100 mg total) by mouth 2 (two) times daily as needed for cough.  . Calcium-Magnesium-Vitamin D 400-166.7-133.3 MG-MG-UNIT TABS Take by mouth.  . cholecalciferol (VITAMIN D3) 10 MCG (400 UNIT) TABS tablet Take 400 Units by mouth.  . cyanocobalamin 1000 MCG tablet Take 1,000 mcg by mouth daily.  Marland Kitchen desloratadine (CLARINEX) 5 MG tablet TAKE 1 TABLET(5 MG) BY MOUTH DAILY  . montelukast (SINGULAIR) 10 MG tablet TAKE 1 TABLET BY MOUTH DAILY FOR CHRONIC ALLERGIC RHINITIS/ASTHMA  . pantoprazole (PROTONIX) 40 MG tablet Take 1 tablet (40 mg total) by mouth daily.  . vitamin C (ASCORBIC ACID) 500 MG tablet Take 500 mg by mouth daily.  . fluticasone (FLONASE) 50 MCG/ACT nasal spray Place 2 sprays into both nostrils daily. (Patient not taking: Reported on  02/07/2019)   No facility-administered encounter medications on file as of 03/04/2019.     Surgical History: Past Surgical History:  Procedure Laterality Date  . cartaract surgery both eye Bilateral   . CORONARY ARTERY BYPASS GRAFT Bilateral   . CYSTOSCOPY    . HERNIA REPAIR    . knee replacment Bilateral   . retina surgery on right eye  Right 05/16/2017  . TONSILLECTOMY Bilateral     Medical History: Past Medical History:  Diagnosis Date  . Asthma   . Atrial fibrillation ([place])   . Coronary artery disease   . Hyperlipidemia   . Hypertension     Family History: History reviewed. No pertinent family history.  Social History   Socioeconomic History  . Marital status: Married    Spouse name: Not on file  . Number of children: Not on file  . Years of education: Not on file  . Highest education level: Not on file  Occupational History  . Not on file  Social Needs  . Financial resource strain: Not on file  . Food insecurity    Worry: Not on file    Inability: Not on file  . Transportation needs    Medical: Not on file    Non-medical: Not on file  Tobacco Use  . Smoking status: Never Smoker  . Smokeless tobacco: Never Used  Substance and Sexual Activity  . Alcohol use: No  Frequency: Never  . Drug use: No  . Sexual activity: Not on file  Lifestyle  . Physical activity    Days per week: Not on file    Minutes per session: Not on file  . Stress: Not on file  Relationships  . Social Herbalist on phone: Not on file    Gets together: Not on file    Attends religious service: Not on file    Active member of club or organization: Not on file    Attends meetings of clubs or organizations: Not on file    Relationship status: Not on file  . Intimate partner violence    Fear of current or ex partner: Not on file    Emotionally abused: Not on file    Physically abused: Not on file    Forced sexual activity: Not on file  Other Topics Concern  . Not on  file  Social History Narrative  . Not on file      Review of Systems  Constitutional: Negative.  Negative for chills, fatigue and unexpected weight change.  HENT: Negative.  Negative for congestion, rhinorrhea, sneezing and sore throat.   Eyes: Negative for redness.  Respiratory: Negative.  Negative for cough, chest tightness and shortness of breath.   Cardiovascular: Negative.  Negative for chest pain and palpitations.  Gastrointestinal: Negative.  Negative for abdominal pain, constipation, diarrhea, nausea and vomiting.  Endocrine: Negative.   Genitourinary: Negative.  Negative for dysuria and frequency.  Musculoskeletal: Negative.  Negative for arthralgias, back pain, joint swelling and neck pain.  Skin: Negative.  Negative for rash.  Allergic/Immunologic: Negative.   Neurological: Negative.  Negative for tremors and numbness.  Hematological: Negative for adenopathy. Does not bruise/bleed easily.  Psychiatric/Behavioral: Negative.  Negative for behavioral problems, sleep disturbance and suicidal ideas. The patient is not nervous/anxious.     Vital Signs: BP 124/82   Pulse 80   Resp 16   Ht 5\' 8"  (1.727 m)   Wt 193 lb (87.5 kg)   SpO2 96%   BMI 29.35 kg/m    Physical Exam Vitals signs and nursing note reviewed.  Constitutional:      General: He is not in acute distress.    Appearance: He is well-developed. He is not diaphoretic.  HENT:     Head: Normocephalic and atraumatic.     Mouth/Throat:     Pharynx: No oropharyngeal exudate.  Eyes:     Pupils: Pupils are equal, round, and reactive to light.  Neck:     Musculoskeletal: Normal range of motion and neck supple.     Thyroid: No thyromegaly.     Vascular: No JVD.     Trachea: No tracheal deviation.  Cardiovascular:     Rate and Rhythm: Normal rate and regular rhythm.     Heart sounds: Normal heart sounds. No murmur. No friction rub. No gallop.   Pulmonary:     Effort: Pulmonary effort is normal. No respiratory  distress.     Breath sounds: Normal breath sounds. No wheezing or rales.  Chest:     Chest wall: No tenderness.  Abdominal:     Palpations: Abdomen is soft.     Tenderness: There is no abdominal tenderness. There is no guarding.  Musculoskeletal: Normal range of motion.  Lymphadenopathy:     Cervical: No cervical adenopathy.  Skin:    General: Skin is warm and dry.  Neurological:     Mental Status: He is alert and oriented  to person, place, and time.     Cranial Nerves: No cranial nerve deficit.  Psychiatric:        Behavior: Behavior normal.        Thought Content: Thought content normal.        Judgment: Judgment normal.     Assessment/Plan: 1. Benign essential hypertension Stable, continue present therapy.  2. Gastroesophageal reflux disease without esophagitis Continue to use Protonix as discussed.   General Counseling: maxson oddo understanding of the findings of todays visit and agrees with plan of treatment. I have discussed any further diagnostic evaluation that may be needed or ordered today. We also reviewed his medications today. he has been encouraged to call the office with any questions or concerns that should arise related to todays visit.    No orders of the defined types were placed in this encounter.   No orders of the defined types were placed in this encounter.   Time spent: 20 Minutes   This patient was seen by Orson Gear AGNP-C in Collaboration with Dr Lavera Guise as a part of collaborative care agreement     Kendell Bane AGNP-C Internal medicine

## 2019-04-11 ENCOUNTER — Other Ambulatory Visit: Payer: Self-pay

## 2019-04-11 DIAGNOSIS — J3089 Other allergic rhinitis: Secondary | ICD-10-CM

## 2019-04-11 MED ORDER — DESLORATADINE 5 MG PO TABS: ORAL_TABLET | ORAL | 3 refills | Status: DC

## 2019-04-30 DIAGNOSIS — I480 Paroxysmal atrial fibrillation: Secondary | ICD-10-CM | POA: Diagnosis not present

## 2019-04-30 DIAGNOSIS — E782 Mixed hyperlipidemia: Secondary | ICD-10-CM | POA: Diagnosis not present

## 2019-04-30 DIAGNOSIS — I25810 Atherosclerosis of coronary artery bypass graft(s) without angina pectoris: Secondary | ICD-10-CM | POA: Diagnosis not present

## 2019-04-30 DIAGNOSIS — I10 Essential (primary) hypertension: Secondary | ICD-10-CM | POA: Diagnosis not present

## 2019-04-30 DIAGNOSIS — I6523 Occlusion and stenosis of bilateral carotid arteries: Secondary | ICD-10-CM | POA: Diagnosis not present

## 2019-05-22 ENCOUNTER — Other Ambulatory Visit: Payer: Self-pay

## 2019-05-22 DIAGNOSIS — K219 Gastro-esophageal reflux disease without esophagitis: Secondary | ICD-10-CM

## 2019-05-22 MED ORDER — PANTOPRAZOLE SODIUM 40 MG PO TBEC: 40.0000 mg | DELAYED_RELEASE_TABLET | Freq: Every day | ORAL | 1 refills | Status: DC

## 2019-05-23 ENCOUNTER — Other Ambulatory Visit: Payer: Self-pay | Admitting: Adult Health

## 2019-06-12 DIAGNOSIS — D485 Neoplasm of uncertain behavior of skin: Secondary | ICD-10-CM | POA: Diagnosis not present

## 2019-06-12 DIAGNOSIS — D225 Melanocytic nevi of trunk: Secondary | ICD-10-CM | POA: Diagnosis not present

## 2019-06-12 DIAGNOSIS — C44212 Basal cell carcinoma of skin of right ear and external auricular canal: Secondary | ICD-10-CM | POA: Diagnosis not present

## 2019-06-12 DIAGNOSIS — D2272 Melanocytic nevi of left lower limb, including hip: Secondary | ICD-10-CM | POA: Diagnosis not present

## 2019-06-12 DIAGNOSIS — D2261 Melanocytic nevi of right upper limb, including shoulder: Secondary | ICD-10-CM | POA: Diagnosis not present

## 2019-06-12 DIAGNOSIS — D2271 Melanocytic nevi of right lower limb, including hip: Secondary | ICD-10-CM | POA: Diagnosis not present

## 2019-06-12 DIAGNOSIS — D2262 Melanocytic nevi of left upper limb, including shoulder: Secondary | ICD-10-CM | POA: Diagnosis not present

## 2019-07-01 ENCOUNTER — Other Ambulatory Visit: Payer: Self-pay | Admitting: Adult Health

## 2019-07-01 DIAGNOSIS — R7989 Other specified abnormal findings of blood chemistry: Secondary | ICD-10-CM | POA: Diagnosis not present

## 2019-07-01 DIAGNOSIS — E876 Hypokalemia: Secondary | ICD-10-CM | POA: Diagnosis not present

## 2019-07-02 LAB — TSH: TSH: 3.03 u[IU]/mL (ref 0.450–4.500)

## 2019-07-02 LAB — POTASSIUM: Potassium: 3.1 mmol/L — ABNORMAL LOW (ref 3.5–5.2)

## 2019-07-02 LAB — T4, FREE: Free T4: 1.14 ng/dL (ref 0.82–1.77)

## 2019-07-08 ENCOUNTER — Encounter: Payer: Self-pay | Admitting: Nurse Practitioner

## 2019-07-08 ENCOUNTER — Ambulatory Visit (INDEPENDENT_AMBULATORY_CARE_PROVIDER_SITE_OTHER): Payer: Medicare Other | Admitting: Nurse Practitioner

## 2019-07-08 ENCOUNTER — Other Ambulatory Visit: Payer: Self-pay

## 2019-07-08 VITALS — BP 128/71 | HR 73 | Temp 97.0°F | Resp 16 | Ht 68.0 in | Wt 197.8 lb

## 2019-07-08 DIAGNOSIS — J0141 Acute recurrent pansinusitis: Secondary | ICD-10-CM | POA: Diagnosis not present

## 2019-07-08 DIAGNOSIS — E876 Hypokalemia: Secondary | ICD-10-CM | POA: Diagnosis not present

## 2019-07-08 DIAGNOSIS — J3089 Other allergic rhinitis: Secondary | ICD-10-CM

## 2019-07-08 MED ORDER — LEVOFLOXACIN 500 MG PO TABS: 500.0000 mg | ORAL_TABLET | Freq: Every day | ORAL | 0 refills | Status: DC

## 2019-07-08 MED ORDER — POTASSIUM CHLORIDE ER 10 MEQ PO TBCR: 10.0000 meq | EXTENDED_RELEASE_TABLET | Freq: Every day | ORAL | 2 refills | Status: DC

## 2019-07-08 NOTE — Progress Notes (Signed)
[facility] [place], [place] [address]  Internal MEDICINE  Office Visit Note  Patient Name: James Mathews  [id number]  [id number]  Date of Service: 07/24/2019  Chief Complaint  Patient presents with  . Hypertension  . Hyperlipidemia  . Recurrent Sinusitis    The patient is here for routine follow up. He is having issues with sinuses. Has itchy and watery eyes, nasal congestion, sinus pressure, and congested cough. Symptoms have been present for about two weeks. He has been taking OTC mucinex DM which does seem to help a bit.  Blood pressure is well controlled. Did have potassium level of 3.3 at last check. This was rechecked. His potassium is now 3.1. he is not taking any diuretics for blood pressure. He denies chest pain, chest pressure, or palpitations.       Current Medication: Outpatient Encounter Medications as of 07/08/2019  Medication Sig  . acetaminophen (TYLENOL) 325 MG tablet Take 650 mg by mouth every 6 (six) hours as needed.  Marland Kitchen albuterol (VENTOLIN HFA) 108 (90 Base) MCG/ACT inhaler Inhale 2 puffs into the lungs every 6 (six) hours as needed for wheezing or shortness of breath.  Marland Kitchen amLODipine (NORVASC) 10 MG tablet Take 10 mg by mouth daily.  Marland Kitchen aspirin 325 MG EC tablet Take 325 mg by mouth daily.  Marland Kitchen atorvastatin (LIPITOR) 20 MG tablet Take 20 mg by mouth daily.  . benzonatate (TESSALON) 100 MG capsule Take 1 capsule (100 mg total) by mouth 2 (two) times daily as needed for cough.  . Calcium-Magnesium-Vitamin D 400-166.7-133.3 MG-MG-UNIT TABS Take by mouth.  . cholecalciferol (VITAMIN D3) 10 MCG (400 UNIT) TABS tablet Take 400 Units by mouth.  . cyanocobalamin 1000 MCG tablet Take 1,000 mcg by mouth daily.  Marland Kitchen desloratadine (CLARINEX) 5 MG tablet TAKE 1 TABLET(5 MG) BY MOUTH DAILY  . fluticasone (FLONASE) 50 MCG/ACT nasal spray Place 2 sprays into both nostrils daily.  . montelukast (SINGULAIR) 10 MG tablet TAKE 1 TABLET BY MOUTH DAILY FOR  CHRONIC ALLERGIC RHINITIS/ASTHMA  . Multiple Vitamins-Minerals (MULTIVITAMIN ADULT PO) Take by mouth daily.  . pantoprazole (PROTONIX) 40 MG tablet Take 1 tablet (40 mg total) by mouth daily.  . vitamin C (ASCORBIC ACID) 500 MG tablet Take 500 mg by mouth daily.  Marland Kitchen levofloxacin (LEVAQUIN) 500 MG tablet Take 1 tablet (500 mg total) by mouth daily.  . potassium chloride (KLOR-CON) 10 MEQ tablet Take 1 tablet (10 mEq total) by mouth daily.   No facility-administered encounter medications on file as of 07/08/2019.     Surgical History: Past Surgical History:  Procedure Laterality Date  . cartaract surgery both eye Bilateral   . CORONARY ARTERY BYPASS GRAFT Bilateral   . CYSTOSCOPY    . HERNIA REPAIR    . knee replacment Bilateral   . retina surgery on right eye  Right 05/16/2017  . TONSILLECTOMY Bilateral     Medical History: Past Medical History:  Diagnosis Date  . Asthma   . Atrial fibrillation ([place])   . Coronary artery disease   . Hyperlipidemia   . Hypertension     Family History: History reviewed. No pertinent family history.  Social History   Socioeconomic History  . Marital status: Married    Spouse name: Not on file  . Number of children: Not on file  . Years of education: Not on file  . Highest education level: Not on file  Occupational History  . Not on file  Social Needs  .  Financial resource strain: Not on file  . Food insecurity    Worry: Not on file    Inability: Not on file  . Transportation needs    Medical: Not on file    Non-medical: Not on file  Tobacco Use  . Smoking status: Former Research scientist (life sciences)  . Smokeless tobacco: Never Used  Substance and Sexual Activity  . Alcohol use: No    Frequency: Never  . Drug use: No  . Sexual activity: Not on file  Lifestyle  . Physical activity    Days per week: Not on file    Minutes per session: Not on file  . Stress: Not on file  Relationships  . Social Herbalist on phone: Not on file    Gets  together: Not on file    Attends religious service: Not on file    Active member of club or organization: Not on file    Attends meetings of clubs or organizations: Not on file    Relationship status: Not on file  . Intimate partner violence    Fear of current or ex partner: Not on file    Emotionally abused: Not on file    Physically abused: Not on file    Forced sexual activity: Not on file  Other Topics Concern  . Not on file  Social History Narrative  . Not on file      Review of Systems  Constitutional: Positive for fatigue. Negative for chills and unexpected weight change.  HENT: Positive for congestion, postnasal drip and rhinorrhea. Negative for sneezing and sore throat.   Respiratory: Positive for cough. Negative for chest tightness, shortness of breath and wheezing.   Cardiovascular: Negative for chest pain and palpitations.  Gastrointestinal: Negative for abdominal pain, constipation, diarrhea, nausea and vomiting.  Endocrine: Negative for cold intolerance, heat intolerance, polydipsia and polyuria.  Musculoskeletal: Negative for arthralgias, back pain, joint swelling and neck pain.  Skin: Negative.  Negative for rash.  Allergic/Immunologic: Positive for environmental allergies.  Neurological: Positive for headaches. Negative for dizziness, tremors and numbness.  Hematological: Negative for adenopathy. Does not bruise/bleed easily.  Psychiatric/Behavioral: Negative.  Negative for behavioral problems, sleep disturbance and suicidal ideas. The patient is not nervous/anxious.     Today's Vitals   07/08/19 1342  BP: 128/71  Pulse: 73  Resp: 16  Temp: (!) 97 F (36.1 C)  SpO2: 97%  Weight: 197 lb 12.8 oz (89.7 kg)  Height: 5\' 8"  (1.727 m)   Body mass index is 30.08 kg/m.  Physical Exam Vitals signs and nursing note reviewed.  Constitutional:      General: He is not in acute distress.    Appearance: Normal appearance. He is well-developed. He is not  diaphoretic.  HENT:     Head: Normocephalic and atraumatic.     Nose: Congestion present.     Right Turbinates: Enlarged.     Left Turbinates: Enlarged.     Right Sinus: Frontal sinus tenderness present.     Left Sinus: Frontal sinus tenderness present.     Mouth/Throat:     Pharynx: No oropharyngeal exudate.     Comments: Post nasal drip is evident.  Eyes:     Pupils: Pupils are equal, round, and reactive to light.  Neck:     Musculoskeletal: Normal range of motion and neck supple.     Thyroid: No thyromegaly.     Vascular: No JVD.     Trachea: No tracheal deviation.  Cardiovascular:  Rate and Rhythm: Normal rate and regular rhythm.     Heart sounds: Normal heart sounds. No murmur. No friction rub. No gallop.   Pulmonary:     Effort: Pulmonary effort is normal. No respiratory distress.     Breath sounds: No wheezing or rales.  Chest:     Chest wall: No tenderness.  Abdominal:     General: Bowel sounds are normal.     Palpations: Abdomen is soft.  Musculoskeletal: Normal range of motion.  Lymphadenopathy:     Cervical: No cervical adenopathy.  Skin:    General: Skin is warm and dry.  Neurological:     Mental Status: He is alert and oriented to person, place, and time.     Cranial Nerves: No cranial nerve deficit.  Psychiatric:        Behavior: Behavior normal.        Thought Content: Thought content normal.        Judgment: Judgment normal.    Assessment/Plan: 1. Acute recurrent pansinusitis Start levofloxacin 500mg  daily for 7 days. Rest and increase fluids. Take OTC medications as needed and as indicated to relieve acute symptoms.  - levofloxacin (LEVAQUIN) 500 MG tablet; Take 1 tablet (500 mg total) by mouth daily.  Dispense: 7 tablet; Refill: 0  2. Hypokalemia Start potassium 10nEq daily. Recheck bmp prior to next visit.  - potassium chloride (KLOR-CON) 10 MEQ tablet; Take 1 tablet (10 mEq total) by mouth daily.  Dispense: 30 tablet; Refill: 2  3.  Non-seasonal allergic rhinitis, unspecified trigger Continue all allergy medication as indicated.   General Counseling: akeen beh understanding of the findings of todays visit and agrees with plan of treatment. I have discussed any further diagnostic evaluation that may be needed or ordered today. We also reviewed his medications today. he has been encouraged to call the office with any questions or concerns that should arise related to todays visit.   This patient was seen by [place] with Dr Lavera Guise as a part of collaborative care agreement  Meds ordered this encounter  Medications  . levofloxacin (LEVAQUIN) 500 MG tablet    Sig: Take 1 tablet (500 mg total) by mouth daily.    Dispense:  7 tablet    Refill:  0    Order Specific Question:   Supervising Provider    Answer:   Lavera Guise [id number]  . potassium chloride (KLOR-CON) 10 MEQ tablet    Sig: Take 1 tablet (10 mEq total) by mouth daily.    Dispense:  30 tablet    Refill:  2    Order Specific Question:   Supervising Provider    Answer:   Lavera Guise [id number]    Time spent: 70 Minutes      Dr Lavera Guise Internal medicine

## 2019-07-17 DIAGNOSIS — C44212 Basal cell carcinoma of skin of right ear and external auricular canal: Secondary | ICD-10-CM | POA: Diagnosis not present

## 2019-07-19 ENCOUNTER — Other Ambulatory Visit: Payer: Self-pay | Admitting: Nurse Practitioner

## 2019-07-19 ENCOUNTER — Telehealth: Payer: Self-pay

## 2019-07-19 DIAGNOSIS — J0141 Acute recurrent pansinusitis: Secondary | ICD-10-CM

## 2019-07-19 MED ORDER — PREDNISONE 10 MG (48) PO TBPK: ORAL_TABLET | ORAL | 0 refills | Status: DC

## 2019-07-19 MED ORDER — CEFUROXIME AXETIL 500 MG PO TABS: 500.0000 mg | ORAL_TABLET | Freq: Two times a day (BID) | ORAL | 0 refills | Status: DC

## 2019-07-19 NOTE — Telephone Encounter (Signed)
Round of ceftin 500mg  bid for 10 days. Add prednisone taper - take as directed for 12 days. Both prescriptions were sent to his pharmacy. Has he seen ENT for this?

## 2019-07-19 NOTE — Telephone Encounter (Signed)
Called pt and notifed him that heather sent in ceftin and prednisone. Pt has not seen ENT and advised him to see his ENT for further evaluation.

## 2019-07-19 NOTE — Progress Notes (Signed)
Round of ceftin 500mg  bid for 10 days. Add prednisone taper - take as directed for 12 days. ENT?

## 2019-07-24 DIAGNOSIS — E876 Hypokalemia: Secondary | ICD-10-CM | POA: Insufficient documentation

## 2019-07-29 DIAGNOSIS — Z951 Presence of aortocoronary bypass graft: Secondary | ICD-10-CM | POA: Diagnosis not present

## 2019-07-29 DIAGNOSIS — I2510 Atherosclerotic heart disease of native coronary artery without angina pectoris: Secondary | ICD-10-CM | POA: Diagnosis not present

## 2019-07-29 DIAGNOSIS — Z87891 Personal history of nicotine dependence: Secondary | ICD-10-CM | POA: Diagnosis not present

## 2019-07-29 DIAGNOSIS — I513 Intracardiac thrombosis, not elsewhere classified: Secondary | ICD-10-CM | POA: Diagnosis not present

## 2019-07-29 DIAGNOSIS — I712 Thoracic aortic aneurysm, without rupture: Secondary | ICD-10-CM | POA: Diagnosis not present

## 2019-07-29 DIAGNOSIS — I7100 Dissection of unspecified site of aorta: Secondary | ICD-10-CM | POA: Diagnosis not present

## 2019-07-29 DIAGNOSIS — I728 Aneurysm of other specified arteries: Secondary | ICD-10-CM | POA: Diagnosis not present

## 2019-07-31 ENCOUNTER — Other Ambulatory Visit: Payer: Self-pay

## 2019-07-31 DIAGNOSIS — J3089 Other allergic rhinitis: Secondary | ICD-10-CM

## 2019-07-31 MED ORDER — DESLORATADINE 5 MG PO TABS: ORAL_TABLET | ORAL | 3 refills | Status: DC

## 2019-08-06 ENCOUNTER — Other Ambulatory Visit: Payer: Self-pay

## 2019-08-06 MED ORDER — MONTELUKAST SODIUM 10 MG PO TABS: ORAL_TABLET | ORAL | 5 refills | Status: DC

## 2019-08-13 DIAGNOSIS — Z961 Presence of intraocular lens: Secondary | ICD-10-CM | POA: Diagnosis not present

## 2019-09-27 DIAGNOSIS — Z0001 Encounter for general adult medical examination with abnormal findings: Secondary | ICD-10-CM | POA: Diagnosis not present

## 2019-09-28 LAB — PSA: Prostate Specific Ag, Serum: 0.2 ng/mL (ref 0.0–4.0)

## 2019-10-03 ENCOUNTER — Telehealth: Payer: Self-pay

## 2019-10-03 NOTE — Telephone Encounter (Signed)
CONFIRMED 10-08-19 OV AS VIRTUAL.

## 2019-10-08 ENCOUNTER — Ambulatory Visit (INDEPENDENT_AMBULATORY_CARE_PROVIDER_SITE_OTHER): Payer: Medicare Other | Admitting: Nurse Practitioner

## 2019-10-08 ENCOUNTER — Encounter: Payer: Self-pay | Admitting: Nurse Practitioner

## 2019-10-08 VITALS — BP 127/82 | Temp 97.5°F | Ht 68.0 in | Wt 190.0 lb

## 2019-10-08 DIAGNOSIS — E876 Hypokalemia: Secondary | ICD-10-CM | POA: Diagnosis not present

## 2019-10-08 DIAGNOSIS — J4521 Mild intermittent asthma with (acute) exacerbation: Secondary | ICD-10-CM | POA: Diagnosis not present

## 2019-10-08 DIAGNOSIS — J3089 Other allergic rhinitis: Secondary | ICD-10-CM

## 2019-10-08 MED ORDER — ALBUTEROL SULFATE HFA 108 (90 BASE) MCG/ACT IN AERS: 2.0000 | INHALATION_SPRAY | Freq: Four times a day (QID) | RESPIRATORY_TRACT | 3 refills | Status: DC | PRN

## 2019-10-08 MED ORDER — PREDNISONE 10 MG (21) PO TBPK: ORAL_TABLET | ORAL | 0 refills | Status: DC

## 2019-10-08 NOTE — Progress Notes (Signed)
[facility] [place], [place] [address]  Internal MEDICINE  Telephone Visit  Patient Name: James Mathews  [id number]  [id number]  Date of Service: 10/08/2019  I connected with the patient at 2:55pm by telephone and verified the patients identity using two identifiers.   I discussed the limitations, risks, security and privacy concerns of performing an evaluation and management service by telephone and the availability of in person appointments. I also discussed with the patient that there may be a patient responsible charge related to the service.  The patient expressed understanding and agrees to proceed.    Chief Complaint  Patient presents with  . Telephone Assessment  . Telephone Screen  . Hypertension  . Hyperlipidemia  . Asthma  . Sinusitis  . Cough  . Shortness of Breath  . Wheezing    The patient has been contacted via telephone for follow up visit due to concerns for spread of novel coronavirus. The patient presents for follow up. He reports some post nasal drip, cough, and wheezing at night. He is having increased shortness of breath with exertion. He states he does not feel sick. Having to use rescue inhaler about twice daily. He does need to have refill for his inhaler.  He has no headache, sore throat, or fever.  He was to have potassium level redrawn. Lab actually drawn was PSA. New order sent through Epic to have potassium level drawn.  He states that he got first COVID 19 vaccine in January. He is to get dose #2 on 10/17/2019. He states that he tolerated this well.       Current Medication: Outpatient Encounter Medications as of 10/08/2019  Medication Sig  . acetaminophen (TYLENOL) 325 MG tablet Take 650 mg by mouth every 6 (six) hours as needed.  Marland Kitchen albuterol (VENTOLIN HFA) 108 (90 Base) MCG/ACT inhaler Inhale 2 puffs into the lungs every 6 (six) hours as needed for wheezing or shortness of breath.  Marland Kitchen amLODipine (NORVASC) 10 MG tablet  Take 10 mg by mouth daily.  Marland Kitchen aspirin 325 MG EC tablet Take 325 mg by mouth daily.  Marland Kitchen atorvastatin (LIPITOR) 20 MG tablet Take 20 mg by mouth daily.  . benzonatate (TESSALON) 100 MG capsule Take 1 capsule (100 mg total) by mouth 2 (two) times daily as needed for cough.  . Calcium-Magnesium-Vitamin D 400-166.7-133.3 MG-MG-UNIT TABS Take by mouth.  . cholecalciferol (VITAMIN D3) 10 MCG (400 UNIT) TABS tablet Take 400 Units by mouth.  . cyanocobalamin 1000 MCG tablet Take 1,000 mcg by mouth daily.  Marland Kitchen desloratadine (CLARINEX) 5 MG tablet TAKE 1 TABLET(5 MG) BY MOUTH DAILY  . fluticasone (FLONASE) 50 MCG/ACT nasal spray Place 2 sprays into both nostrils daily.  . montelukast (SINGULAIR) 10 MG tablet TAKE 1 TABLET BY MOUTH DAILY FOR CHRONIC ALLERGIC RHINITIS/ASTHMA  . Multiple Vitamins-Minerals (MULTIVITAMIN ADULT PO) Take by mouth daily.  . pantoprazole (PROTONIX) 40 MG tablet Take 1 tablet (40 mg total) by mouth daily.  . potassium chloride (KLOR-CON) 10 MEQ tablet Take 1 tablet (10 mEq total) by mouth daily.  . vitamin C (ASCORBIC ACID) 500 MG tablet Take 500 mg by mouth daily.  . [DISCONTINUED] albuterol (VENTOLIN HFA) 108 (90 Base) MCG/ACT inhaler Inhale 2 puffs into the lungs every 6 (six) hours as needed for wheezing or shortness of breath.  . [DISCONTINUED] cefUROXime (CEFTIN) 500 MG tablet Take 1 tablet (500 mg total) by mouth 2 (two) times daily with a meal.  . [DISCONTINUED] levofloxacin (LEVAQUIN)  500 MG tablet Take 1 tablet (500 mg total) by mouth daily.  . [DISCONTINUED] predniSONE (STERAPRED UNI-PAK 48 TAB) 10 MG (48) TBPK tablet 12 day taper - take by mouth as directed for 12 days  . predniSONE (STERAPRED UNI-PAK 21 TAB) 10 MG (21) TBPK tablet 6 day taper - take by mouth as directed for 6 days   No facility-administered encounter medications on file as of 10/08/2019.    Surgical History: Past Surgical History:  Procedure Laterality Date  . cartaract surgery both eye Bilateral   .  CORONARY ARTERY BYPASS GRAFT Bilateral   . CYSTOSCOPY    . HERNIA REPAIR    . knee replacment Bilateral   . retina surgery on right eye  Right 05/16/2017  . TONSILLECTOMY Bilateral     Medical History: Past Medical History:  Diagnosis Date  . Asthma   . Atrial fibrillation ([place])   . Coronary artery disease   . Hyperlipidemia   . Hypertension     Family History: History reviewed. No pertinent family history.  Social History   Socioeconomic History  . Marital status: Married    Spouse name: Not on file  . Number of children: Not on file  . Years of education: Not on file  . Highest education level: Not on file  Occupational History  . Not on file  Tobacco Use  . Smoking status: Former Research scientist (life sciences)  . Smokeless tobacco: Never Used  Substance and Sexual Activity  . Alcohol use: No  . Drug use: No  . Sexual activity: Not on file  Other Topics Concern  . Not on file  Social History Narrative  . Not on file   Social Determinants of Health   Financial Resource Strain:   . Difficulty of Paying Living Expenses: Not on file  Food Insecurity:   . Worried About Charity fundraiser in the Last Year: Not on file  . Ran Out of Food in the Last Year: Not on file  Transportation Needs:   . Lack of Transportation (Medical): Not on file  . Lack of Transportation (Non-Medical): Not on file  Physical Activity:   . Days of Exercise per Week: Not on file  . Minutes of Exercise per Session: Not on file  Stress:   . Feeling of Stress : Not on file  Social Connections:   . Frequency of Communication with Friends and Family: Not on file  . Frequency of Social Gatherings with Friends and Family: Not on file  . Attends Religious Services: Not on file  . Active Member of Clubs or Organizations: Not on file  . Attends Archivist Meetings: Not on file  . Marital Status: Not on file  Intimate Partner Violence:   . Fear of Current or Ex-Partner: Not on file  . Emotionally Abused:  Not on file  . Physically Abused: Not on file  . Sexually Abused: Not on file      Review of Systems  Constitutional: Positive for fatigue. Negative for chills and unexpected weight change.  HENT: Positive for congestion, postnasal drip and rhinorrhea. Negative for sneezing and sore throat.   Respiratory: Positive for cough and wheezing. Negative for chest tightness and shortness of breath.   Cardiovascular: Negative for chest pain and palpitations.  Gastrointestinal: Negative for abdominal pain, constipation, diarrhea, nausea and vomiting.  Endocrine: Negative for cold intolerance, heat intolerance, polydipsia and polyuria.  Musculoskeletal: Negative for arthralgias, back pain, joint swelling and neck pain.  Skin: Negative for rash.  Allergic/Immunologic: Positive for environmental allergies.  Neurological: Positive for headaches. Negative for dizziness, tremors and numbness.  Hematological: Negative for adenopathy. Does not bruise/bleed easily.  Psychiatric/Behavioral: Negative.  Negative for behavioral problems, sleep disturbance and suicidal ideas. The patient is not nervous/anxious.     Today's Vitals   10/08/19 1343  BP: 127/82  Temp: (!) 97.5 F (36.4 C)  Weight: 190 lb (86.2 kg)  Height: 5\' 8"  (1.727 m)   Body mass index is 28.89 kg/m.  Observation/Objective:  The patient is alert and oriented. He is pleasant and answering all questions appropriately. Breathing is non-labored. He is in no acute distress.  The patient has dry cough which is heard over the phone. This is not productive.    Assessment/Plan: 1. Mild intermittent asthma with acute exacerbation Symptoms worsening. Add prednisone taper. Take as directed for 6 days. Continue to use rescue inhaler as needed and as prescribed. If symptoms are persistent, will consider antibiotics nad chest x-ray.  - predniSONE (STERAPRED UNI-PAK 21 TAB) 10 MG (21) TBPK tablet; 6 day taper - take by mouth as directed for 6 days   Dispense: 21 tablet; Refill: 0 - albuterol (VENTOLIN HFA) 108 (90 Base) MCG/ACT inhaler; Inhale 2 puffs into the lungs every 6 (six) hours as needed for wheezing or shortness of breath.  Dispense: 18 g; Refill: 3  2. Hypokalemia Check potassium level and treat as indicated.  - Potassium; Future - Potassium  3. Allergic rhinitis due to other allergic trigger, unspecified seasonality Continue all allergy medication as prescribed   General Counseling: urbain eiler understanding of the findings of today's phone visit and agrees with plan of treatment. I have discussed any further diagnostic evaluation that may be needed or ordered today. We also reviewed his medications today. he has been encouraged to call the office with any questions or concerns that should arise related to todays visit.  This patient was seen by Leretha Pol FNP Collaboration with Dr Lavera Guise as a part of collaborative care agreement  Orders Placed This Encounter  Procedures  . Potassium    Meds ordered this encounter  Medications  . predniSONE (STERAPRED UNI-PAK 21 TAB) 10 MG (21) TBPK tablet    Sig: 6 day taper - take by mouth as directed for 6 days    Dispense:  21 tablet    Refill:  0    Order Specific Question:   Supervising Provider    Answer:   Lavera Guise [place]  . albuterol (VENTOLIN HFA) 108 (90 Base) MCG/ACT inhaler    Sig: Inhale 2 puffs into the lungs every 6 (six) hours as needed for wheezing or shortness of breath.    Dispense:  18 g    Refill:  3    Order Specific Question:   Supervising Provider    Answer:   Lavera Guise [id number]    Time spent: 30 Minutes    Dr Lavera Guise Internal medicine

## 2019-10-15 DIAGNOSIS — E876 Hypokalemia: Secondary | ICD-10-CM | POA: Diagnosis not present

## 2019-10-16 ENCOUNTER — Other Ambulatory Visit: Payer: Self-pay | Admitting: Nurse Practitioner

## 2019-10-16 ENCOUNTER — Telehealth: Payer: Self-pay

## 2019-10-16 DIAGNOSIS — E876 Hypokalemia: Secondary | ICD-10-CM

## 2019-10-16 LAB — POTASSIUM: Potassium: 3.2 mmol/L — ABNORMAL LOW (ref 3.5–5.2)

## 2019-10-16 MED ORDER — POTASSIUM CHLORIDE ER 10 MEQ PO TBCR: 10.0000 meq | EXTENDED_RELEASE_TABLET | Freq: Every day | ORAL | 3 refills | Status: DC

## 2019-10-16 NOTE — Telephone Encounter (Signed)
-----   Message from Ronnell Freshwater, NP sent at 10/16/2019  8:49 AM EST ----- Please let the patient know that Potassium slightly improved, but still too low. Level is 3.2, up from 3.1. continue potassium supplement daily. I renewed this prescription and sent to [facility]. Thanks

## 2019-10-16 NOTE — Progress Notes (Signed)
Please let the patient know that Potassium slightly improved, but still too low. Level is 3.2, up from 3.1. continue potassium supplement daily. I renewed this prescription and sent to [facility]. Thanks

## 2019-10-16 NOTE — Progress Notes (Signed)
Potassium slightly improved, but still too low. Level is 3.2, up from 3.1. continue potassium supplement daily. I renewed this prescription and sent to [facility].

## 2019-10-16 NOTE — Telephone Encounter (Signed)
Pt was notified.  

## 2019-11-04 ENCOUNTER — Other Ambulatory Visit: Payer: Self-pay

## 2019-11-04 DIAGNOSIS — I480 Paroxysmal atrial fibrillation: Secondary | ICD-10-CM | POA: Diagnosis not present

## 2019-11-04 DIAGNOSIS — I10 Essential (primary) hypertension: Secondary | ICD-10-CM | POA: Diagnosis not present

## 2019-11-04 DIAGNOSIS — K219 Gastro-esophageal reflux disease without esophagitis: Secondary | ICD-10-CM

## 2019-11-04 DIAGNOSIS — E782 Mixed hyperlipidemia: Secondary | ICD-10-CM | POA: Diagnosis not present

## 2019-11-04 DIAGNOSIS — I7100 Dissection of unspecified site of aorta: Secondary | ICD-10-CM | POA: Diagnosis not present

## 2019-11-04 DIAGNOSIS — I712 Thoracic aortic aneurysm, without rupture: Secondary | ICD-10-CM | POA: Diagnosis not present

## 2019-11-04 DIAGNOSIS — I6523 Occlusion and stenosis of bilateral carotid arteries: Secondary | ICD-10-CM | POA: Diagnosis not present

## 2019-11-04 MED ORDER — PANTOPRAZOLE SODIUM 40 MG PO TBEC: 40.0000 mg | DELAYED_RELEASE_TABLET | Freq: Every day | ORAL | 1 refills | Status: DC

## 2019-11-06 ENCOUNTER — Telehealth: Payer: Self-pay

## 2019-11-06 NOTE — Telephone Encounter (Signed)
PRIOR AUTHORIZATION FOR PANTOPRAZOLE HAS BEEN APPROVED. TAT

## 2019-11-27 DIAGNOSIS — L298 Other pruritus: Secondary | ICD-10-CM | POA: Diagnosis not present

## 2019-11-27 DIAGNOSIS — D2261 Melanocytic nevi of right upper limb, including shoulder: Secondary | ICD-10-CM | POA: Diagnosis not present

## 2019-11-27 DIAGNOSIS — L820 Inflamed seborrheic keratosis: Secondary | ICD-10-CM | POA: Diagnosis not present

## 2019-11-27 DIAGNOSIS — D2272 Melanocytic nevi of left lower limb, including hip: Secondary | ICD-10-CM | POA: Diagnosis not present

## 2019-11-27 DIAGNOSIS — C44629 Squamous cell carcinoma of skin of left upper limb, including shoulder: Secondary | ICD-10-CM | POA: Diagnosis not present

## 2019-11-27 DIAGNOSIS — Z85828 Personal history of other malignant neoplasm of skin: Secondary | ICD-10-CM | POA: Diagnosis not present

## 2019-11-27 DIAGNOSIS — D485 Neoplasm of uncertain behavior of skin: Secondary | ICD-10-CM | POA: Diagnosis not present

## 2019-11-27 DIAGNOSIS — L853 Xerosis cutis: Secondary | ICD-10-CM | POA: Diagnosis not present

## 2019-11-27 DIAGNOSIS — D2271 Melanocytic nevi of right lower limb, including hip: Secondary | ICD-10-CM | POA: Diagnosis not present

## 2019-11-27 DIAGNOSIS — L538 Other specified erythematous conditions: Secondary | ICD-10-CM | POA: Diagnosis not present

## 2019-12-04 ENCOUNTER — Other Ambulatory Visit: Payer: Self-pay

## 2019-12-04 DIAGNOSIS — J3089 Other allergic rhinitis: Secondary | ICD-10-CM

## 2019-12-04 MED ORDER — DESLORATADINE 5 MG PO TABS: ORAL_TABLET | ORAL | 3 refills | Status: DC

## 2019-12-05 DIAGNOSIS — M79641 Pain in right hand: Secondary | ICD-10-CM | POA: Diagnosis not present

## 2019-12-05 DIAGNOSIS — M79642 Pain in left hand: Secondary | ICD-10-CM | POA: Diagnosis not present

## 2019-12-05 DIAGNOSIS — M180 Bilateral primary osteoarthritis of first carpometacarpal joints: Secondary | ICD-10-CM | POA: Diagnosis not present

## 2020-01-07 DIAGNOSIS — C44629 Squamous cell carcinoma of skin of left upper limb, including shoulder: Secondary | ICD-10-CM | POA: Diagnosis not present

## 2020-01-30 ENCOUNTER — Other Ambulatory Visit: Payer: Self-pay

## 2020-01-30 MED ORDER — MONTELUKAST SODIUM 10 MG PO TABS: ORAL_TABLET | ORAL | 5 refills | Status: DC

## 2020-02-17 ENCOUNTER — Encounter: Payer: Self-pay | Admitting: Adult Health

## 2020-02-17 ENCOUNTER — Other Ambulatory Visit: Payer: Self-pay

## 2020-02-17 ENCOUNTER — Ambulatory Visit (INDEPENDENT_AMBULATORY_CARE_PROVIDER_SITE_OTHER): Payer: Medicare Other | Admitting: Adult Health

## 2020-02-17 VITALS — BP 110/67 | HR 77 | Temp 96.2°F | Resp 16 | Ht 68.0 in | Wt 194.2 lb

## 2020-02-17 DIAGNOSIS — R079 Chest pain, unspecified: Secondary | ICD-10-CM | POA: Diagnosis not present

## 2020-02-17 DIAGNOSIS — E538 Deficiency of other specified B group vitamins: Secondary | ICD-10-CM | POA: Diagnosis not present

## 2020-02-17 DIAGNOSIS — R300 Dysuria: Secondary | ICD-10-CM | POA: Diagnosis not present

## 2020-02-17 DIAGNOSIS — E876 Hypokalemia: Secondary | ICD-10-CM | POA: Diagnosis not present

## 2020-02-17 DIAGNOSIS — I6523 Occlusion and stenosis of bilateral carotid arteries: Secondary | ICD-10-CM | POA: Diagnosis not present

## 2020-02-17 DIAGNOSIS — E559 Vitamin D deficiency, unspecified: Secondary | ICD-10-CM

## 2020-02-17 DIAGNOSIS — Z0001 Encounter for general adult medical examination with abnormal findings: Secondary | ICD-10-CM | POA: Diagnosis not present

## 2020-02-17 DIAGNOSIS — J0110 Acute frontal sinusitis, unspecified: Secondary | ICD-10-CM | POA: Diagnosis not present

## 2020-02-17 DIAGNOSIS — I480 Paroxysmal atrial fibrillation: Secondary | ICD-10-CM

## 2020-02-17 DIAGNOSIS — J4521 Mild intermittent asthma with (acute) exacerbation: Secondary | ICD-10-CM | POA: Diagnosis not present

## 2020-02-17 DIAGNOSIS — R5383 Other fatigue: Secondary | ICD-10-CM | POA: Diagnosis not present

## 2020-02-17 MED ORDER — POTASSIUM CHLORIDE ER 10 MEQ PO TBCR: 10.0000 meq | EXTENDED_RELEASE_TABLET | Freq: Every day | ORAL | 3 refills | Status: DC

## 2020-02-17 MED ORDER — AZITHROMYCIN 250 MG PO TABS: ORAL_TABLET | ORAL | 0 refills | Status: DC

## 2020-02-17 NOTE — Progress Notes (Signed)
[facility] [place],  [address]  Internal MEDICINE  Office Visit Note  Patient Name: James Mathews  [id number]  [id number]  Date of Service: 02/17/2020  Chief Complaint  Patient presents with  . Medicare Wellness  . Hypertension  . Hyperlipidemia     HPI Pt is here for routine health maintenance examination.  He is a well appearing 79 yo male.  He reports overall he has been at baseline.  He does complain of some fatigue and feeling "tired" recently.  He has also noticed his blood pressure has been low at home when he checks it. He currently takes Norvasc 10mg  daily.  His HLD is controlled with Lipitor.  Pt is complaining of sinusitis symptoms.  He has been taking OTC meds with no relief for over 10 days.    Current Medication: Outpatient Encounter Medications as of 02/17/2020  Medication Sig  . acetaminophen (TYLENOL) 325 MG tablet Take 650 mg by mouth every 6 (six) hours as needed.  Marland Kitchen albuterol (VENTOLIN HFA) 108 (90 Base) MCG/ACT inhaler Inhale 2 puffs into the lungs every 6 (six) hours as needed for wheezing or shortness of breath.  Marland Kitchen amLODipine (NORVASC) 10 MG tablet Take 10 mg by mouth daily.  Marland Kitchen aspirin 325 MG EC tablet Take 325 mg by mouth daily.  Marland Kitchen atorvastatin (LIPITOR) 20 MG tablet Take 20 mg by mouth daily.  . benzonatate (TESSALON) 100 MG capsule Take 1 capsule (100 mg total) by mouth 2 (two) times daily as needed for cough.  . Calcium-Magnesium-Vitamin D 400-166.7-133.3 MG-MG-UNIT TABS Take by mouth.  . cholecalciferol (VITAMIN D3) 10 MCG (400 UNIT) TABS tablet Take 400 Units by mouth.  . cyanocobalamin 1000 MCG tablet Take 1,000 mcg by mouth daily.  Marland Kitchen desloratadine (CLARINEX) 5 MG tablet TAKE 1 TABLET(5 MG) BY MOUTH DAILY  . fluticasone (FLONASE) 50 MCG/ACT nasal spray Place 2 sprays into both nostrils daily.  . montelukast (SINGULAIR) 10 MG tablet TAKE 1 TABLET BY MOUTH DAILY FOR CHRONIC ALLERGIC RHINITIS/ASTHMA  . Multiple  Vitamins-Minerals (MULTIVITAMIN ADULT PO) Take by mouth daily.  . pantoprazole (PROTONIX) 40 MG tablet Take 1 tablet (40 mg total) by mouth daily.  . potassium chloride (KLOR-CON) 10 MEQ tablet Take 1 tablet (10 mEq total) by mouth daily.  . predniSONE (STERAPRED UNI-PAK 21 TAB) 10 MG (21) TBPK tablet 6 day taper - take by mouth as directed for 6 days  . vitamin C (ASCORBIC ACID) 500 MG tablet Take 500 mg by mouth daily.  . [DISCONTINUED] potassium chloride (KLOR-CON) 10 MEQ tablet Take 1 tablet (10 mEq total) by mouth daily.  Marland Kitchen azithromycin (ZITHROMAX) 250 MG tablet Take as directed   No facility-administered encounter medications on file as of 02/17/2020.    Surgical History: Past Surgical History:  Procedure Laterality Date  . cartaract surgery both eye Bilateral   . CORONARY ARTERY BYPASS GRAFT Bilateral   . CYSTOSCOPY    . HERNIA REPAIR    . knee replacment Bilateral   . retina surgery on right eye  Right 05/16/2017  . TONSILLECTOMY Bilateral     Medical History: Past Medical History:  Diagnosis Date  . Asthma   . Atrial fibrillation ([place])   . Coronary artery disease   . Hyperlipidemia   . Hypertension     Family History: No family history on file.    Review of Systems  Constitutional: Negative.  Negative for chills, fatigue and unexpected weight change.  HENT: Negative.  Negative for  congestion, rhinorrhea, sneezing and sore throat.   Eyes: Negative for redness.  Respiratory: Negative.  Negative for cough, chest tightness and shortness of breath.   Cardiovascular: Negative.  Negative for chest pain and palpitations.  Gastrointestinal: Negative.  Negative for abdominal pain, constipation, diarrhea, nausea and vomiting.  Endocrine: Negative.   Genitourinary: Negative.  Negative for dysuria and frequency.  Musculoskeletal: Negative.  Negative for arthralgias, back pain, joint swelling and neck pain.  Skin: Negative.  Negative for rash.  Allergic/Immunologic:  Negative.   Neurological: Negative.  Negative for tremors and numbness.  Hematological: Negative for adenopathy. Does not bruise/bleed easily.  Psychiatric/Behavioral: Negative.  Negative for behavioral problems, sleep disturbance and suicidal ideas. The patient is not nervous/anxious.      Vital Signs: BP 110/67   Pulse 77   Temp (!) 96.2 F (35.7 C)   Resp 16   Ht 5\' 8"  (1.727 m)   Wt 194 lb 3.2 oz (88.1 kg)   SpO2 96%   BMI 29.53 kg/m    Physical Exam Vitals and nursing note reviewed.  Constitutional:      General: He is not in acute distress.    Appearance: He is well-developed. He is not diaphoretic.  HENT:     Head: Normocephalic and atraumatic.     Mouth/Throat:     Pharynx: No oropharyngeal exudate.  Eyes:     Pupils: Pupils are equal, round, and reactive to light.  Neck:     Thyroid: No thyromegaly.     Vascular: No JVD.     Trachea: No tracheal deviation.  Cardiovascular:     Rate and Rhythm: Normal rate and regular rhythm.     Heart sounds: Normal heart sounds. No murmur. No friction rub. No gallop.   Pulmonary:     Effort: Pulmonary effort is normal. No respiratory distress.     Breath sounds: Normal breath sounds. No wheezing or rales.  Chest:     Chest wall: No tenderness.  Abdominal:     Palpations: Abdomen is soft.     Tenderness: There is no abdominal tenderness. There is no guarding.  Musculoskeletal:        General: Normal range of motion.     Cervical back: Normal range of motion and neck supple.  Lymphadenopathy:     Cervical: No cervical adenopathy.  Skin:    General: Skin is warm and dry.  Neurological:     Mental Status: He is alert and oriented to person, place, and time.     Cranial Nerves: No cranial nerve deficit.  Psychiatric:        Behavior: Behavior normal.        Thought Content: Thought content normal.        Judgment: Judgment normal.      LABS: No results found for this or any previous visit (from the past 2160  hour(s)).   Assessment/Plan: 1. Encounter for general adult medical examination with abnormal findings Up to date on PHM.  Review labs when available.e  - CBC with Differential/Platelet - TSH - T4, free - Comprehensive metabolic panel  2. Hypokalemia Refilled KLOR-Con at this time.  - potassium chloride (KLOR-CON) 10 MEQ tablet; Take 1 tablet (10 mEq total) by mouth daily.  Dispense: 30 tablet; Refill: 3  3. Fatigue, unspecified type Check labs and follow up as scheduled.  - CBC with Differential/Platelet - TSH - T4, free - Comprehensive metabolic panel - A25 and Folate Panel - Vitamin D (25 hydroxy) - Fe+TIBC+Fer -  ECHOCARDIOGRAM COMPLETE; Future  4. Mild intermittent asthma with acute exacerbation No recent issues, discussed using inhaler as needed.   5. B12 deficiency - B12 and Folate Panel  6. Vitamin D deficiency - Vitamin D (25 hydroxy)  7. Bilateral carotid artery stenosis Patient has not had carotid US in 2 years.  He was 50% stenosed at that time.  Recheck [place].  - US Carotid Bilateral; Future  8. Dysuria - Urinalysis, Routine w reflex microscopic  9. Chest pain, unspecified type 12 Lead EKG today shows A-FIB.  Stop Norvasc and follow up as discussed.  - EKG 12-Lead - ECHOCARDIOGRAM COMPLETE; Future  10. Paroxysmal atrial fibrillation (HCC) - ECHOCARDIOGRAM COMPLETE; Future  11. Acute frontal sinusitis, recurrence not specified Advised patient to take entire course of antibiotics as prescribed with food. Pt should return to clinic in 7-10 days if symptoms fail to improve or new symptoms develop.  - azithromycin (ZITHROMAX) 250 MG tablet; Take as directed  Dispense: 6 tablet; Refill: 0  General Counseling: roque schill understanding of the findings of todays visit and agrees with plan of treatment. I have discussed any further diagnostic evaluation that may be needed or ordered today. We also reviewed his medications today. he has been encouraged to  call the office with any questions or concerns that should arise related to todays visit.   Orders Placed This Encounter  Procedures  . US Carotid Bilateral  . Urinalysis, Routine w reflex microscopic  . CBC with Differential/Platelet  . TSH  . T4, free  . Comprehensive metabolic panel  . B12 and Folate Panel  . Vitamin D (25 hydroxy)  . Fe+TIBC+Fer  . EKG 12-Lead  . ECHOCARDIOGRAM COMPLETE    Meds ordered this encounter  Medications  . potassium chloride (KLOR-CON) 10 MEQ tablet    Sig: Take 1 tablet (10 mEq total) by mouth daily.    Dispense:  30 tablet    Refill:  3  . azithromycin (ZITHROMAX) 250 MG tablet    Sig: Take as directed    Dispense:  6 tablet    Refill:  0    Time spent: 30 Minutes   This patient was seen by Orson Gear AGNP-C in Collaboration with Dr Lavera Guise as a part of collaborative care agreement    Kendell Bane AGNP-C Internal Medicine

## 2020-02-18 DIAGNOSIS — R5383 Other fatigue: Secondary | ICD-10-CM | POA: Diagnosis not present

## 2020-02-18 DIAGNOSIS — E559 Vitamin D deficiency, unspecified: Secondary | ICD-10-CM | POA: Diagnosis not present

## 2020-02-18 DIAGNOSIS — Z0001 Encounter for general adult medical examination with abnormal findings: Secondary | ICD-10-CM | POA: Diagnosis not present

## 2020-02-18 DIAGNOSIS — E538 Deficiency of other specified B group vitamins: Secondary | ICD-10-CM | POA: Diagnosis not present

## 2020-02-18 LAB — URINALYSIS, ROUTINE W REFLEX MICROSCOPIC
Bilirubin, UA: NEGATIVE
Glucose, UA: NEGATIVE
Ketones, UA: NEGATIVE
Leukocytes,UA: NEGATIVE
Nitrite, UA: NEGATIVE
Protein,UA: NEGATIVE
RBC, UA: NEGATIVE
Specific Gravity, UA: 1.014 (ref 1.005–1.030)
Urobilinogen, Ur: 0.2 mg/dL (ref 0.2–1.0)
pH, UA: 7.5 (ref 5.0–7.5)

## 2020-02-19 LAB — COMPREHENSIVE METABOLIC PANEL
ALT: 16 IU/L (ref 0–44)
AST: 23 IU/L (ref 0–40)
Albumin/Globulin Ratio: 2.4 — ABNORMAL HIGH (ref 1.2–2.2)
Albumin: 4.7 g/dL (ref 3.7–4.7)
Alkaline Phosphatase: 79 IU/L (ref 48–121)
BUN/Creatinine Ratio: 17 (ref 10–24)
BUN: 17 mg/dL (ref 8–27)
Bilirubin Total: 0.5 mg/dL (ref 0.0–1.2)
CO2: 24 mmol/L (ref 20–29)
Calcium: 9.8 mg/dL (ref 8.6–10.2)
Chloride: 105 mmol/L (ref 96–106)
Creatinine, Ser: 1.02 mg/dL (ref 0.76–1.27)
GFR calc Af Amer: 80 mL/min/{1.73_m2} (ref >59)
GFR calc non Af Amer: 69 mL/min/{1.73_m2} (ref >59)
Globulin, Total: 2 g/dL (ref 1.5–4.5)
Glucose: 115 mg/dL — ABNORMAL HIGH (ref 65–99)
Potassium: 4 mmol/L (ref 3.5–5.2)
Sodium: 142 mmol/L (ref 134–144)
Total Protein: 6.7 g/dL (ref 6.0–8.5)

## 2020-02-19 LAB — CBC WITH DIFFERENTIAL/PLATELET
Basophils Absolute: 0.1 10*3/uL (ref 0.0–0.2)
Basos: 1 %
EOS (ABSOLUTE): 0.5 10*3/uL — ABNORMAL HIGH (ref 0.0–0.4)
Eos: 6 %
Hematocrit: 43.4 % (ref 37.5–51.0)
Hemoglobin: 14.2 g/dL (ref 13.0–17.7)
Immature Grans (Abs): 0 10*3/uL (ref 0.0–0.1)
Immature Granulocytes: 0 %
Lymphocytes Absolute: 2.7 10*3/uL (ref 0.7–3.1)
Lymphs: 34 %
MCH: 29.5 pg (ref 26.6–33.0)
MCHC: 32.7 g/dL (ref 31.5–35.7)
MCV: 90 fL (ref 79–97)
Monocytes Absolute: 0.8 10*3/uL (ref 0.1–0.9)
Monocytes: 10 %
Neutrophils Absolute: 4 10*3/uL (ref 1.4–7.0)
Neutrophils: 49 %
Platelets: 226 10*3/uL (ref 150–450)
RBC: 4.82 x10E6/uL (ref 4.14–5.80)
RDW: 14.2 % (ref 11.6–15.4)
WBC: 8.1 10*3/uL (ref 3.4–10.8)

## 2020-02-19 LAB — TSH: TSH: 3.03 u[IU]/mL (ref 0.450–4.500)

## 2020-02-19 LAB — IRON,TIBC AND FERRITIN PANEL
Ferritin: 25 ng/mL — ABNORMAL LOW (ref 30–400)
Iron Saturation: 24 % (ref 15–55)
Iron: 99 ug/dL (ref 38–169)
Total Iron Binding Capacity: 414 ug/dL (ref 250–450)
UIBC: 315 ug/dL (ref 111–343)

## 2020-02-19 LAB — T4, FREE: Free T4: 1.09 ng/dL (ref 0.82–1.77)

## 2020-02-19 LAB — B12 AND FOLATE PANEL
Folate: 18.8 ng/mL (ref >3.0)
Vitamin B-12: >2000 pg/mL — ABNORMAL HIGH (ref 232–1245)

## 2020-02-19 LAB — VITAMIN D 25 HYDROXY (VIT D DEFICIENCY, FRACTURES): Vit D, 25-Hydroxy: 44.6 ng/mL (ref 30.0–100.0)

## 2020-03-09 ENCOUNTER — Other Ambulatory Visit: Payer: Self-pay

## 2020-03-09 DIAGNOSIS — J3089 Other allergic rhinitis: Secondary | ICD-10-CM

## 2020-03-09 MED ORDER — DESLORATADINE 5 MG PO TABS: ORAL_TABLET | ORAL | 3 refills | Status: DC

## 2020-03-13 ENCOUNTER — Other Ambulatory Visit: Payer: Self-pay

## 2020-03-13 ENCOUNTER — Ambulatory Visit (INDEPENDENT_AMBULATORY_CARE_PROVIDER_SITE_OTHER): Payer: Medicare Other

## 2020-03-13 DIAGNOSIS — I6523 Occlusion and stenosis of bilateral carotid arteries: Secondary | ICD-10-CM

## 2020-03-19 NOTE — Procedures (Signed)
[place], [place] [id number]  DATE OF SERVICE: March 13, 2020  CAROTID DOPPLER INTERPRETATION:  Bilateral Carotid Ultrsasound and Color Doppler Examination was performed. The RIGHT CCA shows mild plaque in the vessel. The LEFT CCA shows mild plaque in the vessel. There was mild intimal thickening noted in the RIGHT carotid artery. There was mild intimal thickening in the LEFT carotid artery.  The RIGHT CCA shows peak systolic velocity of 71 cm per second. The end diastolic velocity is 16 cm per second on the RIGHT side. The RIGHT ICA shows peak systolic velocity of 92 per second. RIGHT sided ICA end diastolic velocity is 37 cm per second. The RIGHT ECA shows a peak systolic velocity of 94 cm per second. The ICA/CCA ratio is calculated to be 1.3. This suggests less than 50% stenosis. The Vertebral Artery shows antegrade flow.  The LEFT CCA shows peak systolic velocity of 75 cm per second. The end diastolic velocity is 16 cm per second on the LEFT side. The LEFT ICA shows peak systolic velocity of 87 per second. LEFT sided ICA end diastolic velocity is 25 cm per second. The LEFT ECA shows a peak systolic velocity of 97 cm per second. The ICA/CCA ratio is calculated to be 1.2. This suggests less than 50% stenosis. The Vertebral Artery shows antegrade flow.   Impression:    The RIGHT CAROTID shows less than 50% stenosis. The LEFT CAROTID shows less than 50% stenosis.  There is mild plaque formation noted on the LEFT and mild plaque on the RIGHT  side. Consider a repeat Carotid doppler if clinical situation and symptoms warrant in 6-12 months. Patient should be encouraged to change lifestyles such as smoking cessation, regular exercise and dietary modification. Use of statins in the right clinical setting and ASA is encouraged.  Allyne Gee, MD [facility] Pulmonary Critical Care Medicine

## 2020-03-20 ENCOUNTER — Other Ambulatory Visit: Payer: Medicare Other

## 2020-03-27 ENCOUNTER — Ambulatory Visit: Payer: Medicare Other

## 2020-03-27 ENCOUNTER — Telehealth: Payer: Self-pay

## 2020-03-27 DIAGNOSIS — R079 Chest pain, unspecified: Secondary | ICD-10-CM

## 2020-03-27 DIAGNOSIS — I480 Paroxysmal atrial fibrillation: Secondary | ICD-10-CM

## 2020-03-27 DIAGNOSIS — R5383 Other fatigue: Secondary | ICD-10-CM

## 2020-03-27 NOTE — Telephone Encounter (Signed)
Confirmed appointment on 03/31/2020. klh

## 2020-03-31 ENCOUNTER — Ambulatory Visit (INDEPENDENT_AMBULATORY_CARE_PROVIDER_SITE_OTHER): Payer: Medicare Other | Admitting: Adult Health

## 2020-03-31 ENCOUNTER — Other Ambulatory Visit: Payer: Self-pay

## 2020-03-31 ENCOUNTER — Encounter: Payer: Self-pay | Admitting: Adult Health

## 2020-03-31 VITALS — BP 132/72 | HR 64 | Temp 97.8°F | Resp 16 | Ht 68.0 in | Wt 195.8 lb

## 2020-03-31 DIAGNOSIS — I10 Essential (primary) hypertension: Secondary | ICD-10-CM

## 2020-03-31 DIAGNOSIS — E782 Mixed hyperlipidemia: Secondary | ICD-10-CM | POA: Diagnosis not present

## 2020-03-31 DIAGNOSIS — E538 Deficiency of other specified B group vitamins: Secondary | ICD-10-CM | POA: Diagnosis not present

## 2020-03-31 DIAGNOSIS — I6523 Occlusion and stenosis of bilateral carotid arteries: Secondary | ICD-10-CM

## 2020-03-31 NOTE — Progress Notes (Signed)
[facility] [place], [place] [address]  Internal MEDICINE  Office Visit Note  Patient Name: James Mathews  [id number]  [id number]  Date of Service: 03/31/2020  Chief Complaint  Patient presents with  . Follow-up    review labs and ultrasound   . Hypertension  . Hyperlipidemia    HPI  Pt is here to review labs and carotid US.  He reports he recently restarted taking his blood pressure medication because his bp is elevated.  He Denies Chest pain, Shortness of breath, palpitations, headache, or blurred vision. His Carotid US shows less than 50% stenosis on both sides.  He does have some mild plaque formation bilaterally as well. He denies any symptoms.     Current Medication: Outpatient Encounter Medications as of 03/31/2020  Medication Sig  . acetaminophen (TYLENOL) 325 MG tablet Take 650 mg by mouth every 6 (six) hours as needed.  Marland Kitchen albuterol (VENTOLIN HFA) 108 (90 Base) MCG/ACT inhaler Inhale 2 puffs into the lungs every 6 (six) hours as needed for wheezing or shortness of breath.  Marland Kitchen amLODipine (NORVASC) 10 MG tablet Take 10 mg by mouth daily.  Marland Kitchen aspirin 325 MG EC tablet Take 325 mg by mouth daily.  Marland Kitchen atorvastatin (LIPITOR) 20 MG tablet Take 20 mg by mouth daily.  Marland Kitchen azithromycin (ZITHROMAX) 250 MG tablet Take as directed  . benzonatate (TESSALON) 100 MG capsule Take 1 capsule (100 mg total) by mouth 2 (two) times daily as needed for cough.  . Calcium-Magnesium-Vitamin D 400-166.7-133.3 MG-MG-UNIT TABS Take by mouth.  . cholecalciferol (VITAMIN D3) 10 MCG (400 UNIT) TABS tablet Take 400 Units by mouth.  . cyanocobalamin 1000 MCG tablet Take 1,000 mcg by mouth daily.  Marland Kitchen desloratadine (CLARINEX) 5 MG tablet TAKE 1 TABLET(5 MG) BY MOUTH DAILY  . fluticasone (FLONASE) 50 MCG/ACT nasal spray Place 2 sprays into both nostrils daily.  . montelukast (SINGULAIR) 10 MG tablet TAKE 1 TABLET BY MOUTH DAILY FOR CHRONIC ALLERGIC RHINITIS/ASTHMA  . Multiple  Vitamins-Minerals (MULTIVITAMIN ADULT PO) Take by mouth daily.  . pantoprazole (PROTONIX) 40 MG tablet Take 1 tablet (40 mg total) by mouth daily.  . potassium chloride (KLOR-CON) 10 MEQ tablet Take 1 tablet (10 mEq total) by mouth daily.  . predniSONE (STERAPRED UNI-PAK 21 TAB) 10 MG (21) TBPK tablet 6 day taper - take by mouth as directed for 6 days  . vitamin C (ASCORBIC ACID) 500 MG tablet Take 500 mg by mouth daily.   No facility-administered encounter medications on file as of 03/31/2020.    Surgical History: Past Surgical History:  Procedure Laterality Date  . cartaract surgery both eye Bilateral   . CORONARY ARTERY BYPASS GRAFT Bilateral   . CYSTOSCOPY    . HERNIA REPAIR    . knee replacment Bilateral   . retina surgery on right eye  Right 05/16/2017  . TONSILLECTOMY Bilateral     Medical History: Past Medical History:  Diagnosis Date  . Asthma   . Atrial fibrillation ([place])   . Coronary artery disease   . Hyperlipidemia   . Hypertension     Family History: History reviewed. No pertinent family history.  Social History   Socioeconomic History  . Marital status: Married    Spouse name: Not on file  . Number of children: Not on file  . Years of education: Not on file  . Highest education level: Not on file  Occupational History  . Not on file  Tobacco Use  .  Smoking status: Former Research scientist (life sciences)  . Smokeless tobacco: Former Network engineer and Sexual Activity  . Alcohol use: No  . Drug use: No  . Sexual activity: Not on file  Other Topics Concern  . Not on file  Social History Narrative  . Not on file   Social Determinants of Health   Financial Resource Strain:   . Difficulty of Paying Living Expenses:   Food Insecurity:   . Worried About Charity fundraiser in the Last Year:   . Arboriculturist in the Last Year:   Transportation Needs:   . Film/video editor (Medical):   Marland Kitchen Lack of Transportation (Non-Medical):   Physical Activity:   . Days of  Exercise per Week:   . Minutes of Exercise per Session:   Stress:   . Feeling of Stress :   Social Connections:   . Frequency of Communication with Friends and Family:   . Frequency of Social Gatherings with Friends and Family:   . Attends Religious Services:   . Active Member of Clubs or Organizations:   . Attends Archivist Meetings:   Marland Kitchen Marital Status:   Intimate Partner Violence:   . Fear of Current or Ex-Partner:   . Emotionally Abused:   Marland Kitchen Physically Abused:   . Sexually Abused:       Review of Systems  Constitutional: Negative.  Negative for chills, fatigue and unexpected weight change.  HENT: Negative.  Negative for congestion, rhinorrhea, sneezing and sore throat.   Eyes: Negative for redness.  Respiratory: Negative.  Negative for cough, chest tightness and shortness of breath.   Cardiovascular: Negative.  Negative for chest pain and palpitations.  Gastrointestinal: Negative.  Negative for abdominal pain, constipation, diarrhea, nausea and vomiting.  Endocrine: Negative.   Genitourinary: Negative.  Negative for dysuria and frequency.  Musculoskeletal: Negative.  Negative for arthralgias, back pain, joint swelling and neck pain.  Skin: Negative.  Negative for rash.  Allergic/Immunologic: Negative.   Neurological: Negative.  Negative for tremors and numbness.  Hematological: Negative for adenopathy. Does not bruise/bleed easily.  Psychiatric/Behavioral: Negative.  Negative for behavioral problems, sleep disturbance and suicidal ideas. The patient is not nervous/anxious.     Vital Signs: BP 132/72   Pulse 64   Temp 97.8 F (36.6 C)   Resp 16   Ht 5\' 8"  (1.727 m)   Wt 195 lb 12.8 oz (88.8 kg)   SpO2 98%   BMI 29.77 kg/m    Physical Exam Vitals and nursing note reviewed.  Constitutional:      General: He is not in acute distress.    Appearance: He is well-developed. He is not diaphoretic.  HENT:     Head: Normocephalic and atraumatic.      Mouth/Throat:     Pharynx: No oropharyngeal exudate.  Eyes:     Pupils: Pupils are equal, round, and reactive to light.  Neck:     Thyroid: No thyromegaly.     Vascular: No JVD.     Trachea: No tracheal deviation.  Cardiovascular:     Rate and Rhythm: Normal rate and regular rhythm.     Heart sounds: Normal heart sounds. No murmur heard.  No friction rub. No gallop.   Pulmonary:     Effort: Pulmonary effort is normal. No respiratory distress.     Breath sounds: Normal breath sounds. No wheezing or rales.  Chest:     Chest wall: No tenderness.  Abdominal:     Palpations:  Abdomen is soft.     Tenderness: There is no abdominal tenderness. There is no guarding.  Musculoskeletal:        General: Normal range of motion.     Cervical back: Normal range of motion and neck supple.  Lymphadenopathy:     Cervical: No cervical adenopathy.  Skin:    General: Skin is warm and dry.  Neurological:     Mental Status: He is alert and oriented to person, place, and time.     Cranial Nerves: No cranial nerve deficit.  Psychiatric:        Behavior: Behavior normal.        Thought Content: Thought content normal.        Judgment: Judgment normal.    Assessment/Plan: 1. Bilateral carotid artery stenosis Less than 50% stenosis with some plaque bilaterally.  Will repeat Carotids in 6-12 months.  Encouraged lifestyle modifications and dietary restricitions as well as the importance of staying active. Continue lipitor.    2. B12 deficiency Stable, continue with B12 supplementation as before.   3. Essential hypertension Stable, continue current management.   4. Mixed hyperlipidemia Continue lipitor.   General Counseling: melquan ernsberger understanding of the findings of todays visit and agrees with plan of treatment. I have discussed any further diagnostic evaluation that may be needed or ordered today. We also reviewed his medications today. he has been encouraged to call the office with any  questions or concerns that should arise related to todays visit.    No orders of the defined types were placed in this encounter.   No orders of the defined types were placed in this encounter.   Time spent:*30Minutes   This patient was seen by Orson Gear AGNP-C in Collaboration with Dr Lavera Guise as a part of collaborative care agreement     Kendell Bane AGNP-C Internal medicine

## 2020-04-02 DIAGNOSIS — E782 Mixed hyperlipidemia: Secondary | ICD-10-CM | POA: Diagnosis not present

## 2020-05-06 DIAGNOSIS — I714 Abdominal aortic aneurysm, without rupture: Secondary | ICD-10-CM | POA: Diagnosis not present

## 2020-05-06 DIAGNOSIS — I10 Essential (primary) hypertension: Secondary | ICD-10-CM | POA: Diagnosis not present

## 2020-05-06 DIAGNOSIS — I25810 Atherosclerosis of coronary artery bypass graft(s) without angina pectoris: Secondary | ICD-10-CM | POA: Diagnosis not present

## 2020-05-06 DIAGNOSIS — I480 Paroxysmal atrial fibrillation: Secondary | ICD-10-CM | POA: Diagnosis not present

## 2020-05-06 DIAGNOSIS — E782 Mixed hyperlipidemia: Secondary | ICD-10-CM | POA: Diagnosis not present

## 2020-05-11 DIAGNOSIS — Z85828 Personal history of other malignant neoplasm of skin: Secondary | ICD-10-CM | POA: Diagnosis not present

## 2020-05-11 DIAGNOSIS — L821 Other seborrheic keratosis: Secondary | ICD-10-CM | POA: Diagnosis not present

## 2020-05-11 DIAGNOSIS — D485 Neoplasm of uncertain behavior of skin: Secondary | ICD-10-CM | POA: Diagnosis not present

## 2020-05-11 DIAGNOSIS — Z1283 Encounter for screening for malignant neoplasm of skin: Secondary | ICD-10-CM | POA: Diagnosis not present

## 2020-05-11 DIAGNOSIS — R208 Other disturbances of skin sensation: Secondary | ICD-10-CM | POA: Diagnosis not present

## 2020-05-11 DIAGNOSIS — Z08 Encounter for follow-up examination after completed treatment for malignant neoplasm: Secondary | ICD-10-CM | POA: Diagnosis not present

## 2020-05-13 ENCOUNTER — Other Ambulatory Visit: Payer: Self-pay

## 2020-05-13 DIAGNOSIS — K219 Gastro-esophageal reflux disease without esophagitis: Secondary | ICD-10-CM

## 2020-05-13 MED ORDER — PANTOPRAZOLE SODIUM 40 MG PO TBEC
40.0000 mg | DELAYED_RELEASE_TABLET | Freq: Every day | ORAL | 1 refills | Status: DC
Start: 2020-05-13 — End: 2020-10-15

## 2020-06-03 ENCOUNTER — Encounter: Payer: Self-pay | Admitting: Hospice and Palliative Medicine

## 2020-06-03 ENCOUNTER — Ambulatory Visit (INDEPENDENT_AMBULATORY_CARE_PROVIDER_SITE_OTHER): Payer: Medicare Other | Admitting: Internal Medicine

## 2020-06-03 VITALS — BP 131/77 | Temp 97.3°F | Resp 16 | Ht 68.0 in | Wt 190.0 lb

## 2020-06-03 DIAGNOSIS — J3089 Other allergic rhinitis: Secondary | ICD-10-CM

## 2020-06-03 DIAGNOSIS — J0100 Acute maxillary sinusitis, unspecified: Secondary | ICD-10-CM | POA: Diagnosis not present

## 2020-06-03 DIAGNOSIS — J4521 Mild intermittent asthma with (acute) exacerbation: Secondary | ICD-10-CM | POA: Diagnosis not present

## 2020-06-03 MED ORDER — PREDNISONE 10 MG (21) PO TBPK: ORAL_TABLET | ORAL | 0 refills | Status: DC

## 2020-06-03 MED ORDER — FLUTICASONE PROPIONATE 50 MCG/ACT NA SUSP: 2.0000 | Freq: Every day | NASAL | 3 refills | Status: DC

## 2020-06-03 MED ORDER — LEVOFLOXACIN 500 MG PO TABS: 500.0000 mg | ORAL_TABLET | Freq: Every day | ORAL | 0 refills | Status: DC

## 2020-06-03 NOTE — Progress Notes (Signed)
[facility] [place], [place] [address]  Internal MEDICINE  Telephone Visit  Patient Name: James Mathews  [id number]  [id number]  Date of Service: 06/11/2020  I connected with the patient at 1212 pm by telephone and verified the patients identity using two identifiers.   I discussed the limitations, risks, security and privacy concerns of performing an evaluation and management service by telephone and the availability of in person appointments. I also discussed with the patient that there may be a patient responsible charge related to the service.  The patient expressed understanding and agrees to proceed.    Chief Complaint  Patient presents with  . Telephone Assessment    [phone]   . Telephone Screen  . Sinusitis  . Cough  . Headache    HPI  Pt is connected via video for concerns of cough and sinus congestion. He is producing copious discharge from his nose. Denies any fever or chills. Denies any exposure to covid 19. He has been vaccinated    Current Medication: Outpatient Encounter Medications as of 06/03/2020  Medication Sig  . acetaminophen (TYLENOL) 325 MG tablet Take 650 mg by mouth every 6 (six) hours as needed.  Marland Kitchen albuterol (VENTOLIN HFA) 108 (90 Base) MCG/ACT inhaler Inhale 2 puffs into the lungs every 6 (six) hours as needed for wheezing or shortness of breath.  Marland Kitchen amLODipine (NORVASC) 10 MG tablet Take 10 mg by mouth daily.  Marland Kitchen aspirin 325 MG EC tablet Take 325 mg by mouth daily.  Marland Kitchen atorvastatin (LIPITOR) 20 MG tablet Take 20 mg by mouth daily.  . benzonatate (TESSALON) 100 MG capsule Take 1 capsule (100 mg total) by mouth 2 (two) times daily as needed for cough.  . Calcium-Magnesium-Vitamin D 400-166.7-133.3 MG-MG-UNIT TABS Take by mouth.  . cholecalciferol (VITAMIN D3) 10 MCG (400 UNIT) TABS tablet Take 400 Units by mouth.  . cyanocobalamin 1000 MCG tablet Take 1,000 mcg by mouth daily.  Marland Kitchen desloratadine (CLARINEX) 5 MG tablet TAKE  1 TABLET(5 MG) BY MOUTH DAILY  . fluticasone (FLONASE) 50 MCG/ACT nasal spray Place 2 sprays into both nostrils daily.  . montelukast (SINGULAIR) 10 MG tablet TAKE 1 TABLET BY MOUTH DAILY FOR CHRONIC ALLERGIC RHINITIS/ASTHMA  . Multiple Vitamins-Minerals (MULTIVITAMIN ADULT PO) Take by mouth daily.  . pantoprazole (PROTONIX) 40 MG tablet Take 1 tablet (40 mg total) by mouth daily.  . potassium chloride (KLOR-CON) 10 MEQ tablet Take 1 tablet (10 mEq total) by mouth daily.  . predniSONE (STERAPRED UNI-PAK 21 TAB) 10 MG (21) TBPK tablet 6 day taper - take by mouth as directed for 6 days  . vitamin C (ASCORBIC ACID) 500 MG tablet Take 500 mg by mouth daily.  . [DISCONTINUED] azithromycin (ZITHROMAX) 250 MG tablet Take as directed  . [DISCONTINUED] fluticasone (FLONASE) 50 MCG/ACT nasal spray Place 2 sprays into both nostrils daily.  . [DISCONTINUED] predniSONE (STERAPRED UNI-PAK 21 TAB) 10 MG (21) TBPK tablet 6 day taper - take by mouth as directed for 6 days  . levofloxacin (LEVAQUIN) 500 MG tablet Take 1 tablet (500 mg total) by mouth daily.   No facility-administered encounter medications on file as of 06/03/2020.    Surgical History: Past Surgical History:  Procedure Laterality Date  . cartaract surgery both eye Bilateral   . CORONARY ARTERY BYPASS GRAFT Bilateral   . CYSTOSCOPY    . HERNIA REPAIR    . knee replacment Bilateral   . retina surgery on right eye  Right 05/16/2017  .  TONSILLECTOMY Bilateral     Medical History: Past Medical History:  Diagnosis Date  . Asthma   . Atrial fibrillation ([place])   . Coronary artery disease   . Hyperlipidemia   . Hypertension     Family History: History reviewed. No pertinent family history.  Social History   Socioeconomic History  . Marital status: Married    Spouse name: Not on file  . Number of children: Not on file  . Years of education: Not on file  . Highest education level: Not on file  Occupational History  . Not on file   Tobacco Use  . Smoking status: Former Research scientist (life sciences)  . Smokeless tobacco: Former Network engineer and Sexual Activity  . Alcohol use: No  . Drug use: No  . Sexual activity: Not on file  Other Topics Concern  . Not on file  Social History Narrative  . Not on file   Social Determinants of Health   Financial Resource Strain:   . Difficulty of Paying Living Expenses: Not on file  Food Insecurity:   . Worried About Charity fundraiser in the Last Year: Not on file  . Ran Out of Food in the Last Year: Not on file  Transportation Needs:   . Lack of Transportation (Medical): Not on file  . Lack of Transportation (Non-Medical): Not on file  Physical Activity:   . Days of Exercise per Week: Not on file  . Minutes of Exercise per Session: Not on file  Stress:   . Feeling of Stress : Not on file  Social Connections:   . Frequency of Communication with Friends and Family: Not on file  . Frequency of Social Gatherings with Friends and Family: Not on file  . Attends Religious Services: Not on file  . Active Member of Clubs or Organizations: Not on file  . Attends Archivist Meetings: Not on file  . Marital Status: Not on file  Intimate Partner Violence:   . Fear of Current or Ex-Partner: Not on file  . Emotionally Abused: Not on file  . Physically Abused: Not on file  . Sexually Abused: Not on file      Review of Systems  Constitutional: Negative for activity change, fatigue and unexpected weight change.  HENT: Positive for congestion, postnasal drip and sinus pressure.   Eyes: Negative.   Respiratory: Positive for cough. Negative for shortness of breath.   Cardiovascular: Negative for chest pain, palpitations and leg swelling.  Musculoskeletal: Negative.   Allergic/Immunologic: Positive for environmental allergies.  Neurological: Negative.     Vital Signs: BP 131/77   Temp (!) 97.3 F (36.3 C)   Resp 16   Ht 5\' 8"  (1.727 m)   Wt 190 lb (86.2 kg)   BMI 28.89 kg/m     Observation/Objective: NAD but does sound congested, other exam is limited due to virtual visit    Assessment/Plan: 1. Acute non-recurrent maxillary sinusitis Pt is instructed to use saline spray more often  - levofloxacin (LEVAQUIN) 500 MG tablet; Take 1 tablet (500 mg total) by mouth daily.  Dispense: 7 tablet; Refill: 0  2. Allergic rhinitis due to other allergic trigger, unspecified seasonality Add OTC antihistamine of his choice  - fluticasone (FLONASE) 50 MCG/ACT nasal spray; Place 2 sprays into both nostrils daily.  Dispense: 16 g; Refill: 3  3. Mild intermittent asthma with acute exacerbation continue MDI at home - predniSONE (STERAPRED UNI-PAK 21 TAB) 10 MG (21) TBPK tablet; 6 day taper -  take by mouth as directed for 6 days  Dispense: 21 tablet; Refill: 0  General Counseling: James Mathews understanding of the findings of today's phone visit and agrees with plan of treatment. I have discussed any further diagnostic evaluation that may be needed or ordered today. We also reviewed his medications today. he has been encouraged to call the office with any questions or concerns that should arise related to todays visit.   Meds ordered this encounter  Medications  . fluticasone (FLONASE) 50 MCG/ACT nasal spray    Sig: Place 2 sprays into both nostrils daily.    Dispense:  16 g    Refill:  3  . levofloxacin (LEVAQUIN) 500 MG tablet    Sig: Take 1 tablet (500 mg total) by mouth daily.    Dispense:  7 tablet    Refill:  0  . predniSONE (STERAPRED UNI-PAK 21 TAB) 10 MG (21) TBPK tablet    Sig: 6 day taper - take by mouth as directed for 6 days    Dispense:  21 tablet    Refill:  0    Time spent:15 Minutes    Dr Lavera Guise Internal medicine

## 2020-06-18 ENCOUNTER — Other Ambulatory Visit: Payer: Self-pay | Admitting: Adult Health

## 2020-06-18 DIAGNOSIS — E876 Hypokalemia: Secondary | ICD-10-CM

## 2020-06-24 ENCOUNTER — Other Ambulatory Visit: Payer: Self-pay | Admitting: Adult Health

## 2020-06-24 DIAGNOSIS — J3089 Other allergic rhinitis: Secondary | ICD-10-CM

## 2020-06-29 ENCOUNTER — Ambulatory Visit (INDEPENDENT_AMBULATORY_CARE_PROVIDER_SITE_OTHER): Payer: Medicare Other | Admitting: Nurse Practitioner

## 2020-06-29 ENCOUNTER — Encounter: Payer: Self-pay | Admitting: Nurse Practitioner

## 2020-06-29 ENCOUNTER — Other Ambulatory Visit: Payer: Self-pay

## 2020-06-29 VITALS — BP 124/78 | HR 67 | Temp 97.6°F | Resp 16 | Ht 68.0 in | Wt 199.8 lb

## 2020-06-29 DIAGNOSIS — J0141 Acute recurrent pansinusitis: Secondary | ICD-10-CM

## 2020-06-29 DIAGNOSIS — I480 Paroxysmal atrial fibrillation: Secondary | ICD-10-CM

## 2020-06-29 DIAGNOSIS — I10 Essential (primary) hypertension: Secondary | ICD-10-CM

## 2020-06-29 DIAGNOSIS — R5383 Other fatigue: Secondary | ICD-10-CM

## 2020-06-29 DIAGNOSIS — J4521 Mild intermittent asthma with (acute) exacerbation: Secondary | ICD-10-CM | POA: Diagnosis not present

## 2020-06-29 DIAGNOSIS — R059 Cough, unspecified: Secondary | ICD-10-CM

## 2020-06-29 MED ORDER — PREDNISONE 10 MG (48) PO TBPK: ORAL_TABLET | ORAL | 0 refills | Status: DC

## 2020-06-29 NOTE — Progress Notes (Signed)
[facility] [place], [place] [address]  Internal MEDICINE  Office Visit Note  Patient Name: James Mathews  [id number]  [id number]  Date of Service: 07/19/2020  Chief Complaint  Patient presents with  . Follow-up  . Hyperlipidemia  . Hypertension  . controlled substance form    reviewed with PT  . Cough    drainage    The patient is here for follow up visit. Today, he is complaining of cough, nasal and sonus congestion. States that sometimes, the congestion and cough will mess with his asthma. He is using rescue inhaler one o two times per day. Feels drained and fatigued and has little energy.       Current Medication: Outpatient Encounter Medications as of 06/29/2020  Medication Sig Note  . acetaminophen (TYLENOL) 325 MG tablet Take 650 mg by mouth every 6 (six) hours as needed.   Marland Kitchen albuterol (VENTOLIN HFA) 108 (90 Base) MCG/ACT inhaler Inhale 2 puffs into the lungs every 6 (six) hours as needed for wheezing or shortness of breath.   Marland Kitchen amLODipine (NORVASC) 10 MG tablet Take 10 mg by mouth daily.   Marland Kitchen aspirin 325 MG EC tablet Take 325 mg by mouth daily.   Marland Kitchen atorvastatin (LIPITOR) 20 MG tablet Take 20 mg by mouth daily.   . benzonatate (TESSALON) 100 MG capsule Take 1 capsule (100 mg total) by mouth 2 (two) times daily as needed for cough.   . Calcium-Magnesium-Vitamin D 400-166.7-133.3 MG-MG-UNIT TABS Take by mouth.   . cholecalciferol (VITAMIN D3) 10 MCG (400 UNIT) TABS tablet Take 400 Units by mouth.   . cyanocobalamin 1000 MCG tablet Take 1,000 mcg by mouth daily.   Marland Kitchen desloratadine (CLARINEX) 5 MG tablet TAKE 1 TABLET(5 MG) BY MOUTH DAILY   . fluticasone (FLONASE) 50 MCG/ACT nasal spray Place 2 sprays into both nostrils daily.   . montelukast (SINGULAIR) 10 MG tablet TAKE 1 TABLET BY MOUTH DAILY FOR CHRONIC ALLERGIC RHINITIS/ASTHMA   . Multiple Vitamins-Minerals (MULTIVITAMIN ADULT PO) Take by mouth daily.   . pantoprazole (PROTONIX) 40 MG  tablet Take 1 tablet (40 mg total) by mouth daily.   . potassium chloride (KLOR-CON) 10 MEQ tablet TAKE 1 TABLET(10 MEQ) BY MOUTH DAILY   . vitamin C (ASCORBIC ACID) 500 MG tablet Take 500 mg by mouth daily.   . [DISCONTINUED] levofloxacin (LEVAQUIN) 500 MG tablet Take 1 tablet (500 mg total) by mouth daily.   . [DISCONTINUED] predniSONE (STERAPRED UNI-PAK 21 TAB) 10 MG (21) TBPK tablet 6 day taper - take by mouth as directed for 6 days 06/29/2020: will treat with 12 day prednisone taper.   . predniSONE (STERAPRED UNI-PAK 48 TAB) 10 MG (48) TBPK tablet 12 day taper - take by mouth as directed for 12 days    No facility-administered encounter medications on file as of 06/29/2020.    Surgical History: Past Surgical History:  Procedure Laterality Date  . cartaract surgery both eye Bilateral   . CORONARY ARTERY BYPASS GRAFT Bilateral   . CYSTOSCOPY    . HERNIA REPAIR    . knee replacment Bilateral   . retina surgery on right eye  Right 05/16/2017  . TONSILLECTOMY Bilateral     Medical History: Past Medical History:  Diagnosis Date  . Asthma   . Atrial fibrillation ([place])   . Coronary artery disease   . Hyperlipidemia   . Hypertension     Family History: History reviewed. No pertinent family history.  Social History  Socioeconomic History  . Marital status: Married    Spouse name: Not on file  . Number of children: Not on file  . Years of education: Not on file  . Highest education level: Not on file  Occupational History  . Not on file  Tobacco Use  . Smoking status: Former Research scientist (life sciences)  . Smokeless tobacco: Former Network engineer and Sexual Activity  . Alcohol use: No  . Drug use: No  . Sexual activity: Not on file  Other Topics Concern  . Not on file  Social History Narrative  . Not on file   Social Determinants of Health   Financial Resource Strain:   . Difficulty of Paying Living Expenses: Not on file  Food Insecurity:   . Worried About Charity fundraiser in  the Last Year: Not on file  . Ran Out of Food in the Last Year: Not on file  Transportation Needs:   . Lack of Transportation (Medical): Not on file  . Lack of Transportation (Non-Medical): Not on file  Physical Activity:   . Days of Exercise per Week: Not on file  . Minutes of Exercise per Session: Not on file  Stress:   . Feeling of Stress : Not on file  Social Connections:   . Frequency of Communication with Friends and Family: Not on file  . Frequency of Social Gatherings with Friends and Family: Not on file  . Attends Religious Services: Not on file  . Active Member of Clubs or Organizations: Not on file  . Attends Archivist Meetings: Not on file  . Marital Status: Not on file  Intimate Partner Violence:   . Fear of Current or Ex-Partner: Not on file  . Emotionally Abused: Not on file  . Physically Abused: Not on file  . Sexually Abused: Not on file      Review of Systems  Constitutional: Positive for fatigue. Negative for activity change, chills, fever and unexpected weight change.  HENT: Positive for congestion, postnasal drip and sinus pressure. Negative for rhinorrhea, sneezing and sore throat.   Respiratory: Positive for cough and shortness of breath. Negative for chest tightness and wheezing.   Cardiovascular: Negative for chest pain and palpitations.  Gastrointestinal: Negative for abdominal pain, constipation, diarrhea, nausea and vomiting.  Endocrine: Negative for cold intolerance, heat intolerance, polydipsia and polyuria.  Musculoskeletal: Negative for arthralgias, back pain, joint swelling and neck pain.  Skin: Negative for rash.  Allergic/Immunologic: Positive for environmental allergies.  Neurological: Positive for dizziness and headaches. Negative for tremors and numbness.  Hematological: Negative for adenopathy. Does not bruise/bleed easily.  Psychiatric/Behavioral: Negative for behavioral problems (Depression), sleep disturbance and suicidal  ideas. The patient is not nervous/anxious.     Today's Vitals   06/29/20 1131  BP: 124/78  Pulse: 67  Resp: 16  Temp: 97.6 F (36.4 C)  SpO2: 97%  Weight: 199 lb 12.8 oz (90.6 kg)  Height: 5\' 8"  (1.727 m)   Body mass index is 30.38 kg/m.  Physical Exam Vitals and nursing note reviewed.  Constitutional:      General: He is not in acute distress.    Appearance: Normal appearance. He is well-developed. He is not diaphoretic.  HENT:     Head: Normocephalic and atraumatic.     Nose: Congestion present.     Right Turbinates: Swollen.     Left Turbinates: Swollen.     Right Sinus: Frontal sinus tenderness present.     Left Sinus: Frontal sinus tenderness  present.     Mouth/Throat:     Pharynx: No oropharyngeal exudate.  Eyes:     Pupils: Pupils are equal, round, and reactive to light.  Neck:     Thyroid: No thyromegaly.     Vascular: No JVD.     Trachea: No tracheal deviation.  Cardiovascular:     Rate and Rhythm: Normal rate and regular rhythm.     Heart sounds: Normal heart sounds. No murmur heard.  No friction rub. No gallop.   Pulmonary:     Effort: Pulmonary effort is normal. No respiratory distress.     Breath sounds: Normal breath sounds. No wheezing or rales.     Comments: Congested sounding cough which is non productive in the office.  Chest:     Chest wall: No tenderness.  Abdominal:     Palpations: Abdomen is soft.  Musculoskeletal:        General: Normal range of motion.     Cervical back: Normal range of motion and neck supple.  Lymphadenopathy:     Cervical: No cervical adenopathy.  Skin:    General: Skin is warm and dry.  Neurological:     Mental Status: He is alert and oriented to person, place, and time.     Cranial Nerves: No cranial nerve deficit.  Psychiatric:        Mood and Affect: Mood normal.        Behavior: Behavior normal.        Thought Content: Thought content normal.        Judgment: Judgment normal.    Assessment/Plan: 1.  Acute recurrent pansinusitis Currently on levofloxacin. Add prednisone dose pack. Take as directed for 12 days. Use OTC medications as needed and as indicated to improve symptoms.   2. Fatigue, unspecified type Likely from recurrent sinus infection. Add prednisone taper. Monitor closely.   3. Cough Continue benzonatate as needed and as prescribed for cough.    4. Mild intermittent asthma with acute exacerbation Use rescue inhaler as needed and as prescribed. Added prednisone dose pack. Take as directed for 12 days.   5. Essential hypertension Stable. Continue bp medication as prescribed.   6. Paroxysmal atrial fibrillation ([place]) Continue regular visits with cardiology as scheduled.   General Counseling: ladale sherburn understanding of the findings of todays visit and agrees with plan of treatment. I have discussed any further diagnostic evaluation that may be needed or ordered today. We also reviewed his medications today. he has been encouraged to call the office with any questions or concerns that should arise related to todays visit.   This patient was seen by [place] with Dr Lavera Guise as a part of collaborative care agreement  Meds ordered this encounter  Medications  . predniSONE (STERAPRED UNI-PAK 48 TAB) 10 MG (48) TBPK tablet    Sig: 12 day taper - take by mouth as directed for 12 days    Dispense:  48 tablet    Refill:  0    Order Specific Question:   Supervising Provider    Answer:   Lavera Guise [[id number]]    Total time spent: 30 Minutes   Time spent includes review of chart, medications, test results, and follow up plan with the patient.      Dr Lavera Guise Internal medicine

## 2020-07-19 DIAGNOSIS — I480 Paroxysmal atrial fibrillation: Secondary | ICD-10-CM | POA: Insufficient documentation

## 2020-07-22 ENCOUNTER — Telehealth: Payer: Self-pay

## 2020-07-22 ENCOUNTER — Other Ambulatory Visit: Payer: Self-pay

## 2020-07-22 DIAGNOSIS — J3089 Other allergic rhinitis: Secondary | ICD-10-CM

## 2020-07-22 MED ORDER — DESLORATADINE 5 MG PO TABS: 5.0000 mg | ORAL_TABLET | Freq: Every day | ORAL | 3 refills | Status: DC

## 2020-07-22 NOTE — Telephone Encounter (Signed)
Printed requested medical records and handed to patient. Release of records on file in scan.

## 2020-07-27 DIAGNOSIS — I77811 Abdominal aortic ectasia: Secondary | ICD-10-CM | POA: Diagnosis not present

## 2020-07-27 DIAGNOSIS — I728 Aneurysm of other specified arteries: Secondary | ICD-10-CM | POA: Diagnosis not present

## 2020-07-27 DIAGNOSIS — I712 Thoracic aortic aneurysm, without rupture: Secondary | ICD-10-CM | POA: Diagnosis not present

## 2020-07-27 DIAGNOSIS — Z87891 Personal history of nicotine dependence: Secondary | ICD-10-CM | POA: Diagnosis not present

## 2020-07-27 DIAGNOSIS — Z79899 Other long term (current) drug therapy: Secondary | ICD-10-CM | POA: Diagnosis not present

## 2020-07-27 DIAGNOSIS — I714 Abdominal aortic aneurysm, without rupture: Secondary | ICD-10-CM | POA: Diagnosis not present

## 2020-07-27 DIAGNOSIS — I77810 Thoracic aortic ectasia: Secondary | ICD-10-CM | POA: Diagnosis not present

## 2020-07-27 DIAGNOSIS — I2510 Atherosclerotic heart disease of native coronary artery without angina pectoris: Secondary | ICD-10-CM | POA: Diagnosis not present

## 2020-07-27 DIAGNOSIS — I513 Intracardiac thrombosis, not elsewhere classified: Secondary | ICD-10-CM | POA: Diagnosis not present

## 2020-07-31 ENCOUNTER — Telehealth: Payer: Self-pay

## 2020-07-31 ENCOUNTER — Ambulatory Visit (INDEPENDENT_AMBULATORY_CARE_PROVIDER_SITE_OTHER): Payer: Medicare Other | Admitting: Nurse Practitioner

## 2020-07-31 ENCOUNTER — Encounter: Payer: Self-pay | Admitting: Nurse Practitioner

## 2020-07-31 ENCOUNTER — Other Ambulatory Visit: Payer: Self-pay

## 2020-07-31 VITALS — BP 115/75 | HR 65 | Temp 97.2°F | Resp 16 | Ht 68.0 in | Wt 199.0 lb

## 2020-07-31 DIAGNOSIS — R1319 Other dysphagia: Secondary | ICD-10-CM | POA: Diagnosis not present

## 2020-07-31 DIAGNOSIS — J3089 Other allergic rhinitis: Secondary | ICD-10-CM

## 2020-07-31 MED ORDER — METOCLOPRAMIDE HCL 5 MG PO TABS: 5.0000 mg | ORAL_TABLET | Freq: Three times a day (TID) | ORAL | 0 refills | Status: DC

## 2020-07-31 MED ORDER — MONTELUKAST SODIUM 10 MG PO TABS: ORAL_TABLET | ORAL | 3 refills | Status: DC

## 2020-07-31 NOTE — Progress Notes (Signed)
[facility] [place], [place] [address]  Internal MEDICINE  Office Visit Note  Patient Name: James Mathews  [id number]  [id number]  Date of Service: 07/31/2020   Pt is here for a sick visit.   Chief Complaint  Patient presents with  . Acute Visit  . Vomiting  . Cough    every now and then     The patient is here for acute visit.  -having trouble swallowing, especially bad with taking pills. Feels like things are getting "hung up" in the esophagus. States that sometimes, ven water can't go down. Will cause him to feel nauseated and will throw up because it won't go down.  -has been going on for a few months and gradually getting worse.  -will hurt if he takes larger bites or more than one pill at a time  -mild constipation -has tried eating small bites of food and taking one pill at a time. This does help some.  -contacted GI provider and is unable to be seen until February. Did schedule appointment.       Current Medication:  Outpatient Encounter Medications as of 07/31/2020  Medication Sig  . acetaminophen (TYLENOL) 325 MG tablet Take 650 mg by mouth every 6 (six) hours as needed.  Marland Kitchen albuterol (VENTOLIN HFA) 108 (90 Base) MCG/ACT inhaler Inhale 2 puffs into the lungs every 6 (six) hours as needed for wheezing or shortness of breath.  Marland Kitchen amLODipine (NORVASC) 10 MG tablet Take 10 mg by mouth daily.  Marland Kitchen aspirin 325 MG EC tablet Take 325 mg by mouth daily.  Marland Kitchen atorvastatin (LIPITOR) 20 MG tablet Take 20 mg by mouth daily.  . benzonatate (TESSALON) 100 MG capsule Take 1 capsule (100 mg total) by mouth 2 (two) times daily as needed for cough.  . Calcium-Magnesium-Vitamin D 400-166.7-133.3 MG-MG-UNIT TABS Take by mouth.  . cholecalciferol (VITAMIN D3) 10 MCG (400 UNIT) TABS tablet Take 400 Units by mouth.  . cyanocobalamin 1000 MCG tablet Take 1,000 mcg by mouth daily.  Marland Kitchen desloratadine (CLARINEX) 5 MG tablet Take 1 tablet (5 mg total) by mouth  daily.  . fluticasone (FLONASE) 50 MCG/ACT nasal spray Place 2 sprays into both nostrils daily.  . montelukast (SINGULAIR) 10 MG tablet TAKE 1 TABLET BY MOUTH DAILY FOR CHRONIC ALLERGIC RHINITIS/ASTHMA  . Multiple Vitamins-Minerals (MULTIVITAMIN ADULT PO) Take by mouth daily.  . pantoprazole (PROTONIX) 40 MG tablet Take 1 tablet (40 mg total) by mouth daily.  . potassium chloride (KLOR-CON) 10 MEQ tablet TAKE 1 TABLET(10 MEQ) BY MOUTH DAILY  . predniSONE (STERAPRED UNI-PAK 48 TAB) 10 MG (48) TBPK tablet 12 day taper - take by mouth as directed for 12 days  . vitamin C (ASCORBIC ACID) 500 MG tablet Take 500 mg by mouth daily.  . [DISCONTINUED] montelukast (SINGULAIR) 10 MG tablet TAKE 1 TABLET BY MOUTH DAILY FOR CHRONIC ALLERGIC RHINITIS/ASTHMA  . metoCLOPramide (REGLAN) 5 MG tablet Take 1 tablet (5 mg total) by mouth 3 (three) times daily before meals.   No facility-administered encounter medications on file as of 07/31/2020.      Medical History: Past Medical History:  Diagnosis Date  . Asthma   . Atrial fibrillation ([place])   . Coronary artery disease   . Hyperlipidemia   . Hypertension     Today's Vitals   07/31/20 1006  BP: 115/75  Pulse: 65  Resp: 16  Temp: (!) 97.2 F (36.2 C)  SpO2: 97%  Weight: 199 lb (90.3 kg)  Height: 5\' 8"  (1.727 m)   Body mass index is 30.26 kg/m.   Review of Systems  Constitutional: Positive for appetite change. Negative for activity change, chills, fatigue and unexpected weight change.  HENT: Negative for congestion, postnasal drip, rhinorrhea, sneezing and sore throat.   Respiratory: Negative for cough, chest tightness, shortness of breath and wheezing.   Cardiovascular: Negative for chest pain and palpitations.  Gastrointestinal: Positive for constipation. Negative for abdominal pain, diarrhea, nausea and vomiting.       Difficulty swallowing. Feels like food and medication is getting stuck in esophagus. Unable to get it down. Causes him  to cough, feel nauseated, and often elicits vomiting.   Musculoskeletal: Negative for arthralgias, back pain, joint swelling and neck pain.  Skin: Negative for rash.  Allergic/Immunologic: Positive for environmental allergies.  Neurological: Negative for dizziness, tremors, numbness and headaches.  Hematological: Negative for adenopathy. Does not bruise/bleed easily.  Psychiatric/Behavioral: Negative for behavioral problems (Depression), sleep disturbance and suicidal ideas. The patient is not nervous/anxious.     Physical Exam Vitals and nursing note reviewed.  Constitutional:      General: He is not in acute distress.    Appearance: Normal appearance. He is well-developed. He is not diaphoretic.  HENT:     Head: Normocephalic and atraumatic.     Mouth/Throat:     Pharynx: No oropharyngeal exudate.  Eyes:     Pupils: Pupils are equal, round, and reactive to light.  Neck:     Thyroid: No thyromegaly.     Vascular: No JVD.     Trachea: No tracheal deviation.  Cardiovascular:     Rate and Rhythm: Normal rate and regular rhythm.     Heart sounds: Normal heart sounds. No murmur heard.  No friction rub. No gallop.   Pulmonary:     Effort: Pulmonary effort is normal. No respiratory distress.     Breath sounds: Normal breath sounds. No wheezing or rales.  Chest:     Chest wall: No tenderness.  Abdominal:     General: Bowel sounds are normal.     Palpations: Abdomen is soft.     Tenderness: There is abdominal tenderness.     Comments: Tenderness with guarding and grimacing with palpation of the epigastric area of abdomen.   Musculoskeletal:        General: Normal range of motion.     Cervical back: Normal range of motion and neck supple.  Lymphadenopathy:     Cervical: No cervical adenopathy.  Skin:    General: Skin is warm and dry.  Neurological:     Mental Status: He is alert and oriented to person, place, and time.     Cranial Nerves: No cranial nerve deficit.  Psychiatric:         Mood and Affect: Mood normal.        Behavior: Behavior normal.        Thought Content: Thought content normal.        Judgment: Judgment normal.   Assessment/Plan: 1. Esophageal dysphagia Short term prescription for reglan 5mg . Take up to three times daily as needed, prior to meals, to help with gastric motility. Will get upper GI with barium for further evaluation.  - DG UGI W SMALL BOWEL; Future - metoCLOPramide (REGLAN) 5 MG tablet; Take 1 tablet (5 mg total) by mouth 3 (three) times daily before meals.  Dispense: 90 tablet; Refill: 0  2. Allergic rhinitis due to other allergic trigger, unspecified seasonality Refill singulair 10mg  daily.  -  montelukast (SINGULAIR) 10 MG tablet; TAKE 1 TABLET BY MOUTH DAILY FOR CHRONIC ALLERGIC RHINITIS/ASTHMA  Dispense: 90 tablet; Refill: 3  General Counseling: tyreque finken understanding of the findings of todays visit and agrees with plan of treatment. I have discussed any further diagnostic evaluation that may be needed or ordered today. We also reviewed his medications today. he has been encouraged to call the office with any questions or concerns that should arise related to todays visit.    Counseling:  This patient was seen by Leretha Pol FNP Collaboration with Dr Lavera Guise as a part of collaborative care agreement  Orders Placed This Encounter  Procedures  . DG UGI W SMALL BOWEL    Meds ordered this encounter  Medications  . metoCLOPramide (REGLAN) 5 MG tablet    Sig: Take 1 tablet (5 mg total) by mouth 3 (three) times daily before meals.    Dispense:  90 tablet    Refill:  0    Order Specific Question:   Supervising Provider    Answer:   Lavera Guise [[id number]]  . montelukast (SINGULAIR) 10 MG tablet    Sig: TAKE 1 TABLET BY MOUTH DAILY FOR CHRONIC ALLERGIC RHINITIS/ASTHMA    Dispense:  90 tablet    Refill:  3    Order Specific Question:   Supervising Provider    Answer:   Lavera Guise [[id number]]    Time spent:  30 Minutes

## 2020-07-31 NOTE — Telephone Encounter (Signed)
LM with son for UGI appt at [facility] on 08/10/20 at 8:30a, no food or drink after midnight  And F/U with Nova on 12/8.

## 2020-08-10 ENCOUNTER — Ambulatory Visit
Admission: RE | Admit: 2020-08-10 | Discharge: 2020-08-10 | Disposition: A | Discharge status: Home / Self Care | Payer: Medicare Other | Source: Ambulatory Visit | Attending: Nurse Practitioner | Admitting: Nurse Practitioner

## 2020-08-10 ENCOUNTER — Other Ambulatory Visit: Payer: Self-pay

## 2020-08-10 DIAGNOSIS — R1319 Other dysphagia: Secondary | ICD-10-CM | POA: Insufficient documentation

## 2020-08-10 DIAGNOSIS — K5710 Diverticulosis of small intestine without perforation or abscess without bleeding: Secondary | ICD-10-CM | POA: Diagnosis not present

## 2020-08-10 DIAGNOSIS — K224 Dyskinesia of esophagus: Secondary | ICD-10-CM | POA: Diagnosis not present

## 2020-08-10 NOTE — Progress Notes (Signed)
Will need referral to GI for esophageal dysmotility and moderate hiatal hernia. Discuss at visit 12/8

## 2020-08-19 ENCOUNTER — Ambulatory Visit (INDEPENDENT_AMBULATORY_CARE_PROVIDER_SITE_OTHER): Payer: Medicare Other | Admitting: Hospice and Palliative Medicine

## 2020-08-19 ENCOUNTER — Encounter: Payer: Self-pay | Admitting: Hospice and Palliative Medicine

## 2020-08-19 ENCOUNTER — Other Ambulatory Visit: Payer: Self-pay

## 2020-08-19 VITALS — BP 130/66 | HR 86 | Temp 98.1°F | Resp 16 | Ht 68.0 in | Wt 203.0 lb

## 2020-08-19 DIAGNOSIS — K224 Dyskinesia of esophagus: Secondary | ICD-10-CM | POA: Diagnosis not present

## 2020-08-19 DIAGNOSIS — I10 Essential (primary) hypertension: Secondary | ICD-10-CM | POA: Diagnosis not present

## 2020-08-19 DIAGNOSIS — K449 Diaphragmatic hernia without obstruction or gangrene: Secondary | ICD-10-CM

## 2020-08-19 NOTE — Progress Notes (Signed)
[facility] [place], [place] [address]  Internal MEDICINE  Office Visit Note  Patient Name: James Mathews  [id number]  [id number]  Date of Service: 08/19/2020  Chief Complaint  Patient presents with  . Follow-up    review UGI    HPI Patient is here for routine follow-up Reviewed results of UGI:  IMPRESSION: 1. Moderate size hiatal hernia. No gastric mass or polypoid filling defect. Normal gastric emptying. No evidence of gastroesophageal reflux during the exam. 2. Moderate esophageal dysmotility with tertiary contractions and stasis of the barium column to the level above the aortic arch. 3.  Small duodenal diverticulum, otherwise normal small bowel.  Has been having issues with swallowing and feeling as though food and his pills get stuck at the bottom of his throat--has been ongoing for several weeks, seems to be getting worse Schedule for upper endoscopy with GI in January    Current Medication: Outpatient Encounter Medications as of 08/19/2020  Medication Sig  . acetaminophen (TYLENOL) 325 MG tablet Take 650 mg by mouth every 6 (six) hours as needed.  Marland Kitchen albuterol (VENTOLIN HFA) 108 (90 Base) MCG/ACT inhaler Inhale 2 puffs into the lungs every 6 (six) hours as needed for wheezing or shortness of breath.  Marland Kitchen amLODipine (NORVASC) 10 MG tablet Take 10 mg by mouth daily.  Marland Kitchen aspirin 325 MG EC tablet Take 325 mg by mouth daily.  Marland Kitchen atorvastatin (LIPITOR) 20 MG tablet Take 20 mg by mouth daily.  . benzonatate (TESSALON) 100 MG capsule Take 1 capsule (100 mg total) by mouth 2 (two) times daily as needed for cough.  . Calcium-Magnesium-Vitamin D 400-166.7-133.3 MG-MG-UNIT TABS Take by mouth.  . cholecalciferol (VITAMIN D3) 10 MCG (400 UNIT) TABS tablet Take 400 Units by mouth.  . cyanocobalamin 1000 MCG tablet Take 1,000 mcg by mouth daily.  Marland Kitchen desloratadine (CLARINEX) 5 MG tablet Take 1 tablet (5 mg total) by mouth daily.  . fluticasone (FLONASE) 50  MCG/ACT nasal spray Place 2 sprays into both nostrils daily.  . metoCLOPramide (REGLAN) 5 MG tablet Take 1 tablet (5 mg total) by mouth 3 (three) times daily before meals.  . montelukast (SINGULAIR) 10 MG tablet TAKE 1 TABLET BY MOUTH DAILY FOR CHRONIC ALLERGIC RHINITIS/ASTHMA  . Multiple Vitamins-Minerals (MULTIVITAMIN ADULT PO) Take by mouth daily.  . pantoprazole (PROTONIX) 40 MG tablet Take 1 tablet (40 mg total) by mouth daily.  . potassium chloride (KLOR-CON) 10 MEQ tablet TAKE 1 TABLET(10 MEQ) BY MOUTH DAILY  . vitamin C (ASCORBIC ACID) 500 MG tablet Take 500 mg by mouth daily.  . [DISCONTINUED] predniSONE (STERAPRED UNI-PAK 48 TAB) 10 MG (48) TBPK tablet 12 day taper - take by mouth as directed for 12 days (Patient not taking: Reported on 08/19/2020)   No facility-administered encounter medications on file as of 08/19/2020.    Surgical History: Past Surgical History:  Procedure Laterality Date  . cartaract surgery both eye Bilateral   . CORONARY ARTERY BYPASS GRAFT Bilateral   . CYSTOSCOPY    . HERNIA REPAIR    . knee replacment Bilateral   . retina surgery on right eye  Right 05/16/2017  . TONSILLECTOMY Bilateral     Medical History: Past Medical History:  Diagnosis Date  . Asthma   . Atrial fibrillation ([place])   . Coronary artery disease   . Hyperlipidemia   . Hypertension     Family History: Family History  Problem Relation Age of Onset  . Diabetes Mother   .  Bowel Disease Father   . Alcohol abuse Father     Social History   Socioeconomic History  . Marital status: Married    Spouse name: Not on file  . Number of children: Not on file  . Years of education: Not on file  . Highest education level: Not on file  Occupational History  . Not on file  Tobacco Use  . Smoking status: Former Research scientist (life sciences)  . Smokeless tobacco: Never Used  Substance and Sexual Activity  . Alcohol use: No  . Drug use: No  . Sexual activity: Not on file  Other Topics Concern  . Not on  file  Social History Narrative  . Not on file   Social Determinants of Health   Financial Resource Strain:   . Difficulty of Paying Living Expenses: Not on file  Food Insecurity:   . Worried About Charity fundraiser in the Last Year: Not on file  . Ran Out of Food in the Last Year: Not on file  Transportation Needs:   . Lack of Transportation (Medical): Not on file  . Lack of Transportation (Non-Medical): Not on file  Physical Activity:   . Days of Exercise per Week: Not on file  . Minutes of Exercise per Session: Not on file  Stress:   . Feeling of Stress : Not on file  Social Connections:   . Frequency of Communication with Friends and Family: Not on file  . Frequency of Social Gatherings with Friends and Family: Not on file  . Attends Religious Services: Not on file  . Active Member of Clubs or Organizations: Not on file  . Attends Archivist Meetings: Not on file  . Marital Status: Not on file  Intimate Partner Violence:   . Fear of Current or Ex-Partner: Not on file  . Emotionally Abused: Not on file  . Physically Abused: Not on file  . Sexually Abused: Not on file      Review of Systems  Constitutional: Negative for chills, fatigue and unexpected weight change.  HENT: Positive for trouble swallowing. Negative for congestion, postnasal drip, rhinorrhea, sneezing and sore throat.   Eyes: Negative for redness.  Respiratory: Negative for cough, chest tightness and shortness of breath.   Cardiovascular: Negative for chest pain, palpitations and leg swelling.  Gastrointestinal: Negative for abdominal pain, constipation, diarrhea, nausea and vomiting.       Feeling as though food/medication gets stuck in his throat, difficulty swallowing  Genitourinary: Negative for dysuria and frequency.  Musculoskeletal: Negative for arthralgias, back pain, joint swelling and neck pain.  Skin: Negative for rash.  Neurological: Negative for tremors and numbness.   Hematological: Negative for adenopathy. Does not bruise/bleed easily.  Psychiatric/Behavioral: Negative for behavioral problems (Depression), sleep disturbance and suicidal ideas. The patient is not nervous/anxious.     Vital Signs: BP 130/66   Pulse 86   Temp 98.1 F (36.7 C)   Resp 16   Ht 5\' 8"  (1.727 m)   Wt 203 lb (92.1 kg)   SpO2 96%   BMI 30.87 kg/m    Physical Exam Vitals reviewed.  Constitutional:      Appearance: Normal appearance. He is normal weight.  Cardiovascular:     Rate and Rhythm: Normal rate and regular rhythm.     Pulses: Normal pulses.     Heart sounds: Normal heart sounds.  Pulmonary:     Effort: Pulmonary effort is normal.     Breath sounds: Normal breath sounds.  Abdominal:  General: Abdomen is flat.     Palpations: Abdomen is soft.  Musculoskeletal:        General: Normal range of motion.  Skin:    General: Skin is warm.  Neurological:     General: No focal deficit present.     Mental Status: He is alert and oriented to person, place, and time. Mental status is at baseline.    Assessment/Plan: 1. Esophageal dysmotility Scheduled for upper endoscopy next month and follow-up with GI Educated safe eating techniques such as eating small bites, chewing food thoroughly, avoiding foods hard to chew, drink liquid between every 2-3 bites, eating slowly  2. Hiatal hernia May need further evaluation after upper endoscopy Continue to monitor  3. Essential hypertension BP and HR well controlled today on current therapy, continue with routine monitoring  General Counseling: garvey westcott understanding of the findings of todays visit and agrees with plan of treatment. I have discussed any further diagnostic evaluation that may be needed or ordered today. We also reviewed his medications today. he has been encouraged to call the office with any questions or concerns that should arise related to todays visit.    Time spent: 30 Minutes Time  spent includes review of chart, medications, test results and follow-up plan with the patient.  This patient was seen by Theodoro Grist AGNP-C in Collaboration with Dr Lavera Guise as a part of collaborative care agreement     Tanna Furry. Stana Bayon AGNP-C Internal medicine

## 2020-08-23 ENCOUNTER — Encounter: Payer: Self-pay | Admitting: Hospice and Palliative Medicine

## 2020-10-12 ENCOUNTER — Other Ambulatory Visit: Payer: Self-pay | Admitting: Internal Medicine

## 2020-10-12 DIAGNOSIS — J3089 Other allergic rhinitis: Secondary | ICD-10-CM

## 2020-10-13 ENCOUNTER — Ambulatory Visit: Payer: Medicare Other | Admitting: Hospice and Palliative Medicine

## 2020-10-13 ENCOUNTER — Other Ambulatory Visit: Payer: Self-pay | Admitting: Internal Medicine

## 2020-10-13 DIAGNOSIS — E876 Hypokalemia: Secondary | ICD-10-CM

## 2020-10-15 ENCOUNTER — Other Ambulatory Visit: Payer: Self-pay

## 2020-10-15 DIAGNOSIS — K219 Gastro-esophageal reflux disease without esophagitis: Secondary | ICD-10-CM

## 2020-10-15 MED ORDER — PANTOPRAZOLE SODIUM 40 MG PO TBEC: 40.0000 mg | DELAYED_RELEASE_TABLET | Freq: Every day | ORAL | 1 refills | Status: DC

## 2020-10-29 DIAGNOSIS — I480 Paroxysmal atrial fibrillation: Secondary | ICD-10-CM | POA: Diagnosis not present

## 2020-10-29 DIAGNOSIS — K219 Gastro-esophageal reflux disease without esophagitis: Secondary | ICD-10-CM | POA: Diagnosis not present

## 2020-10-29 DIAGNOSIS — I739 Peripheral vascular disease, unspecified: Secondary | ICD-10-CM | POA: Diagnosis not present

## 2020-10-29 DIAGNOSIS — K224 Dyskinesia of esophagus: Secondary | ICD-10-CM | POA: Diagnosis not present

## 2020-10-29 DIAGNOSIS — I712 Thoracic aortic aneurysm, without rupture: Secondary | ICD-10-CM | POA: Diagnosis not present

## 2020-10-29 DIAGNOSIS — I714 Abdominal aortic aneurysm, without rupture: Secondary | ICD-10-CM | POA: Diagnosis not present

## 2020-10-29 DIAGNOSIS — R1319 Other dysphagia: Secondary | ICD-10-CM | POA: Diagnosis not present

## 2020-11-05 ENCOUNTER — Other Ambulatory Visit: Payer: Self-pay | Admitting: Nurse Practitioner

## 2020-11-05 DIAGNOSIS — J4521 Mild intermittent asthma with (acute) exacerbation: Secondary | ICD-10-CM

## 2020-11-10 ENCOUNTER — Ambulatory Visit (INDEPENDENT_AMBULATORY_CARE_PROVIDER_SITE_OTHER): Payer: Medicare Other | Admitting: Hospice and Palliative Medicine

## 2020-11-10 ENCOUNTER — Other Ambulatory Visit: Payer: Self-pay

## 2020-11-10 ENCOUNTER — Encounter: Payer: Self-pay | Admitting: Hospice and Palliative Medicine

## 2020-11-10 VITALS — BP 122/82 | HR 67 | Temp 97.6°F | Resp 16 | Ht 68.0 in | Wt 199.2 lb

## 2020-11-10 DIAGNOSIS — K224 Dyskinesia of esophagus: Secondary | ICD-10-CM

## 2020-11-10 DIAGNOSIS — R0609 Other forms of dyspnea: Secondary | ICD-10-CM

## 2020-11-10 DIAGNOSIS — I10 Essential (primary) hypertension: Secondary | ICD-10-CM | POA: Diagnosis not present

## 2020-11-10 DIAGNOSIS — I499 Cardiac arrhythmia, unspecified: Secondary | ICD-10-CM

## 2020-11-10 DIAGNOSIS — R0600 Dyspnea, unspecified: Secondary | ICD-10-CM | POA: Diagnosis not present

## 2020-11-10 DIAGNOSIS — I480 Paroxysmal atrial fibrillation: Secondary | ICD-10-CM

## 2020-11-10 NOTE — Progress Notes (Signed)
[facility] [place], [place] [address]  Internal MEDICINE  Office Visit Note  Patient Name: James Mathews  [id number]  [id number]  Date of Service: 11/11/2020  Chief Complaint  Patient presents with  . Follow-up  . Hyperlipidemia  . Hypertension  . Sinusitis  . coughing    Has to use rescue inhaler more frequent    HPI Patient is here for routine follow-up Has seen GI since our last visit for esophageal dysmotility--swallowing difficulty had since resolved prior to GI evaluation, has been working on eating habits, taking smaller bites, taking one pill (medication) at a time and increasing fluid intake during meals Consider high risk for sedation--EGD on hold at this time GERD felt to be well controlled with pantoprazole   Since our last visit c/o increased shortness of breath Scheduled to see Dr. Nehemiah Massed (cardiology later this month) known history of paroxysmal atrial fibrillation-rate controlled without beta blocker or calcium channel blocker Has noticed some swelling in his ankles  Due to increased shortness of breath he has been using his albuterol inhaler frequently--multiple times per day Has had PFT in the past (2018)-reviewed from [facility]--will be scanned into current EMR, normal PFT at that time  Last echocardiogram in 2021 Normal LVEF, unable to determine diastolic function, atrial enlargement, RVE, moderate PAH, moderate MR, TR, AR Not currently on diuretic therapy  Current Medication: Outpatient Encounter Medications as of 11/10/2020  Medication Sig  . acetaminophen (TYLENOL) 325 MG tablet Take 650 mg by mouth every 6 (six) hours as needed.  Marland Kitchen albuterol (VENTOLIN HFA) 108 (90 Base) MCG/ACT inhaler INHALE 2 PUFFS INTO THE LUNGS EVERY 6 HOURS AS NEEDED FOR WHEEZING OR SHORTNESS OF BREATH  . amLODipine (NORVASC) 10 MG tablet Take 10 mg by mouth daily.  Marland Kitchen aspirin 325 MG EC tablet Take 325 mg by mouth daily.  Marland Kitchen atorvastatin (LIPITOR) 20 MG  tablet Take 20 mg by mouth daily.  . benzonatate (TESSALON) 100 MG capsule Take 1 capsule (100 mg total) by mouth 2 (two) times daily as needed for cough.  . Calcium-Magnesium-Vitamin D 400-166.7-133.3 MG-MG-UNIT TABS Take by mouth.  . cholecalciferol (VITAMIN D3) 10 MCG (400 UNIT) TABS tablet Take 400 Units by mouth.  . cyanocobalamin 1000 MCG tablet Take 1,000 mcg by mouth daily.  Marland Kitchen desloratadine (CLARINEX) 5 MG tablet Take 1 tablet (5 mg total) by mouth daily.  . fluticasone (FLONASE) 50 MCG/ACT nasal spray SHAKE LIQUID AND USE 2 SPRAYS IN EACH NOSTRIL DAILY  . metoCLOPramide (REGLAN) 5 MG tablet Take 1 tablet (5 mg total) by mouth 3 (three) times daily before meals.  . montelukast (SINGULAIR) 10 MG tablet TAKE 1 TABLET BY MOUTH DAILY FOR CHRONIC ALLERGIC RHINITIS/ASTHMA  . Multiple Vitamins-Minerals (MULTIVITAMIN ADULT PO) Take by mouth daily.  . pantoprazole (PROTONIX) 40 MG tablet Take 1 tablet (40 mg total) by mouth daily.  . potassium chloride (KLOR-CON) 10 MEQ tablet TAKE 1 TABLET(10 MEQ) BY MOUTH DAILY  . vitamin C (ASCORBIC ACID) 500 MG tablet Take 500 mg by mouth daily.   No facility-administered encounter medications on file as of 11/10/2020.    Surgical History: Past Surgical History:  Procedure Laterality Date  . cartaract surgery both eye Bilateral   . CORONARY ARTERY BYPASS GRAFT Bilateral   . CYSTOSCOPY    . HERNIA REPAIR    . knee replacment Bilateral   . retina surgery on right eye  Right 05/16/2017  . TONSILLECTOMY Bilateral     Medical History: Past  Medical History:  Diagnosis Date  . Asthma   . Atrial fibrillation ([place])   . Coronary artery disease   . Hyperlipidemia   . Hypertension     Family History: Family History  Problem Relation Age of Onset  . Diabetes Mother   . Bowel Disease Father   . Alcohol abuse Father     Social History   Socioeconomic History  . Marital status: Married    Spouse name: Not on file  . Number of children: Not on  file  . Years of education: Not on file  . Highest education level: Not on file  Occupational History  . Not on file  Tobacco Use  . Smoking status: Former Research scientist (life sciences)  . Smokeless tobacco: Never Used  Substance and Sexual Activity  . Alcohol use: No  . Drug use: No  . Sexual activity: Not on file  Other Topics Concern  . Not on file  Social History Narrative  . Not on file   Social Determinants of Health   Financial Resource Strain: Not on file  Food Insecurity: Not on file  Transportation Needs: Not on file  Physical Activity: Not on file  Stress: Not on file  Social Connections: Not on file  Intimate Partner Violence: Not on file      Review of Systems  Constitutional: Negative for chills, fatigue and unexpected weight change.  HENT: Negative for congestion, postnasal drip, rhinorrhea, sneezing and sore throat.   Eyes: Negative for redness.  Respiratory: Positive for shortness of breath. Negative for cough and chest tightness.   Cardiovascular: Negative for chest pain and palpitations.  Gastrointestinal: Negative for abdominal pain, constipation, diarrhea, nausea and vomiting.  Genitourinary: Negative for dysuria and frequency.  Musculoskeletal: Negative for arthralgias, back pain, joint swelling and neck pain.  Skin: Negative for rash.  Neurological: Negative for tremors and numbness.  Hematological: Negative for adenopathy. Does not bruise/bleed easily.  Psychiatric/Behavioral: Negative for behavioral problems (Depression), sleep disturbance and suicidal ideas. The patient is not nervous/anxious.     Vital Signs: BP 122/82   Pulse 67   Temp 97.6 F (36.4 C)   Resp 16   Ht 5\' 8"  (1.727 m)   Wt 199 lb 3.2 oz (90.4 kg)   SpO2 97%   BMI 30.29 kg/m    Physical Exam Vitals reviewed.  Constitutional:      Appearance: Normal appearance. He is normal weight.  Cardiovascular:     Rate and Rhythm: Normal rate and regular rhythm.     Pulses: Normal pulses.      Heart sounds: Normal heart sounds.  Pulmonary:     Effort: Pulmonary effort is normal.     Breath sounds: Normal breath sounds.  Abdominal:     General: Abdomen is flat.     Palpations: Abdomen is soft.  Musculoskeletal:        General: Normal range of motion.     Cervical back: Normal range of motion.     Right lower leg: Edema present.     Left lower leg: Edema present.  Skin:    General: Skin is warm.  Neurological:     General: No focal deficit present.     Mental Status: He is alert and oriented to person, place, and time. Mental status is at baseline.  Psychiatric:        Mood and Affect: Mood normal.        Behavior: Behavior normal.        Thought Content: Thought  content normal.        Judgment: Judgment normal.    Assessment/Plan: 1. Paroxysmal atrial fibrillation (HCC) A. Fib, rate 54, ST depression Denies chest pain or sensations of palpitations Will defer to cardiology, currently scheduled for in office follow-up in 2 weeks with cardiologist May need right heart cath or diuretic therapy - EKG 12-Lead  2. Essential hypertension BP well controlled, continue to monitor  3. Dyspnea on exertion Consider repeating PFT after follow-up with cardiology Encouraged to minimize use of albuterol inhaler as this can exacerbate A. Fib   4. Esophageal dysmotility Continue with safe swallowing practices and continue with pantoprazole  General Counseling: James Mathews understanding of the findings of todays visit and agrees with plan of treatment. I have discussed any further diagnostic evaluation that may be needed or ordered today. We also reviewed his medications today. he has been encouraged to call the office with any questions or concerns that should arise related to todays visit.    Orders Placed This Encounter  Procedures  . EKG 12-Lead   Time spent: 30 Minutes Time spent includes review of chart, medications, test results and follow-up plan with the  patient.  This patient was seen by Theodoro Grist AGNP-C in Collaboration with Dr Lavera Guise as a part of collaborative care agreement     Tanna Furry. Kelton Bultman AGNP-C Internal medicine

## 2020-11-11 ENCOUNTER — Encounter: Payer: Self-pay | Admitting: Hospice and Palliative Medicine

## 2020-11-21 ENCOUNTER — Other Ambulatory Visit: Payer: Self-pay | Admitting: Adult Health

## 2020-11-21 DIAGNOSIS — J3089 Other allergic rhinitis: Secondary | ICD-10-CM

## 2020-11-23 DIAGNOSIS — I10 Essential (primary) hypertension: Secondary | ICD-10-CM | POA: Diagnosis not present

## 2020-11-23 DIAGNOSIS — I25708 Atherosclerosis of coronary artery bypass graft(s), unspecified, with other forms of angina pectoris: Secondary | ICD-10-CM | POA: Diagnosis not present

## 2020-11-23 DIAGNOSIS — E782 Mixed hyperlipidemia: Secondary | ICD-10-CM | POA: Diagnosis not present

## 2020-11-23 DIAGNOSIS — I480 Paroxysmal atrial fibrillation: Secondary | ICD-10-CM | POA: Diagnosis not present

## 2020-11-23 DIAGNOSIS — I6523 Occlusion and stenosis of bilateral carotid arteries: Secondary | ICD-10-CM | POA: Diagnosis not present

## 2020-11-23 DIAGNOSIS — I714 Abdominal aortic aneurysm, without rupture: Secondary | ICD-10-CM | POA: Diagnosis not present

## 2020-12-07 DIAGNOSIS — I480 Paroxysmal atrial fibrillation: Secondary | ICD-10-CM | POA: Diagnosis not present

## 2020-12-14 DIAGNOSIS — I25708 Atherosclerosis of coronary artery bypass graft(s), unspecified, with other forms of angina pectoris: Secondary | ICD-10-CM | POA: Diagnosis not present

## 2020-12-22 ENCOUNTER — Ambulatory Visit: Payer: Medicare Other | Admitting: Hospice and Palliative Medicine

## 2020-12-22 DIAGNOSIS — D225 Melanocytic nevi of trunk: Secondary | ICD-10-CM | POA: Diagnosis not present

## 2020-12-22 DIAGNOSIS — D2271 Melanocytic nevi of right lower limb, including hip: Secondary | ICD-10-CM | POA: Diagnosis not present

## 2020-12-22 DIAGNOSIS — D2261 Melanocytic nevi of right upper limb, including shoulder: Secondary | ICD-10-CM | POA: Diagnosis not present

## 2020-12-22 DIAGNOSIS — L570 Actinic keratosis: Secondary | ICD-10-CM | POA: Diagnosis not present

## 2020-12-22 DIAGNOSIS — D2262 Melanocytic nevi of left upper limb, including shoulder: Secondary | ICD-10-CM | POA: Diagnosis not present

## 2020-12-22 DIAGNOSIS — X32XXXA Exposure to sunlight, initial encounter: Secondary | ICD-10-CM | POA: Diagnosis not present

## 2020-12-22 DIAGNOSIS — Z85828 Personal history of other malignant neoplasm of skin: Secondary | ICD-10-CM | POA: Diagnosis not present

## 2020-12-23 ENCOUNTER — Other Ambulatory Visit: Payer: Self-pay

## 2020-12-23 ENCOUNTER — Encounter: Payer: Self-pay | Admitting: Hospice and Palliative Medicine

## 2020-12-23 ENCOUNTER — Ambulatory Visit (INDEPENDENT_AMBULATORY_CARE_PROVIDER_SITE_OTHER): Payer: Medicare Other | Admitting: Hospice and Palliative Medicine

## 2020-12-23 VITALS — BP 113/73 | HR 89 | Temp 97.8°F | Resp 16 | Ht 68.0 in | Wt 199.0 lb

## 2020-12-23 DIAGNOSIS — R0609 Other forms of dyspnea: Secondary | ICD-10-CM

## 2020-12-23 DIAGNOSIS — I10 Essential (primary) hypertension: Secondary | ICD-10-CM | POA: Diagnosis not present

## 2020-12-23 DIAGNOSIS — R0600 Dyspnea, unspecified: Secondary | ICD-10-CM | POA: Diagnosis not present

## 2020-12-23 DIAGNOSIS — K224 Dyskinesia of esophagus: Secondary | ICD-10-CM | POA: Diagnosis not present

## 2020-12-23 NOTE — Progress Notes (Signed)
[facility] [place], [place] [address]  Internal MEDICINE  Office Visit Note  Patient Name: James Mathews  [id number]  [id number]  Date of Service: 12/23/2020  Chief Complaint  Patient presents with  . Hypertension  . Hyperlipidemia  . Asthma    HPI Patient is here for routine follow-up Currently undergoing work up with cardiology due to increased shortness of breath with exertion--has worn Zio monitoring device and has had a stress test--scheduled to follow-up with cardiologist in office on Monday to review test results and discuss possible catheterization if needed  Discussed repeating his PFT as shortness of breath could also be from underlying pulmonary disease--at this time he would like to hold off until all testing and work-up has been completed by cardiology  BP remains well controlled Continues to follow safe swallowing practices--has not had any further episodes of choking   Current Medication: Outpatient Encounter Medications as of 12/23/2020  Medication Sig  . acetaminophen (TYLENOL) 325 MG tablet Take 650 mg by mouth every 6 (six) hours as needed.  Marland Kitchen albuterol (VENTOLIN HFA) 108 (90 Base) MCG/ACT inhaler INHALE 2 PUFFS INTO THE LUNGS EVERY 6 HOURS AS NEEDED FOR WHEEZING OR SHORTNESS OF BREATH  . amLODipine (NORVASC) 10 MG tablet Take 10 mg by mouth daily.  Marland Kitchen aspirin 325 MG EC tablet Take 325 mg by mouth daily.  Marland Kitchen atorvastatin (LIPITOR) 20 MG tablet Take 20 mg by mouth daily.  . benzonatate (TESSALON) 100 MG capsule Take 1 capsule (100 mg total) by mouth 2 (two) times daily as needed for cough.  . Calcium-Magnesium-Vitamin D 400-166.7-133.3 MG-MG-UNIT TABS Take by mouth.  . cholecalciferol (VITAMIN D3) 10 MCG (400 UNIT) TABS tablet Take 400 Units by mouth.  . cyanocobalamin 1000 MCG tablet Take 1,000 mcg by mouth daily.  Marland Kitchen desloratadine (CLARINEX) 5 MG tablet Take 1 tablet (5 mg total) by mouth daily.  . fluticasone (FLONASE) 50 MCG/ACT  nasal spray SHAKE LIQUID AND USE 2 SPRAYS IN EACH NOSTRIL DAILY  . metoCLOPramide (REGLAN) 5 MG tablet Take 1 tablet (5 mg total) by mouth 3 (three) times daily before meals.  . montelukast (SINGULAIR) 10 MG tablet TAKE 1 TABLET BY MOUTH DAILY FOR CHRONIC ALLERGIC RHINITIS/ASTHMA  . Multiple Vitamins-Minerals (MULTIVITAMIN ADULT PO) Take by mouth daily.  . pantoprazole (PROTONIX) 40 MG tablet Take 1 tablet (40 mg total) by mouth daily.  . potassium chloride (KLOR-CON) 10 MEQ tablet TAKE 1 TABLET(10 MEQ) BY MOUTH DAILY  . vitamin C (ASCORBIC ACID) 500 MG tablet Take 500 mg by mouth daily.   No facility-administered encounter medications on file as of 12/23/2020.    Surgical History: Past Surgical History:  Procedure Laterality Date  . cartaract surgery both eye Bilateral   . CORONARY ARTERY BYPASS GRAFT Bilateral   . CYSTOSCOPY    . HERNIA REPAIR    . knee replacment Bilateral   . retina surgery on right eye  Right 05/16/2017  . TONSILLECTOMY Bilateral     Medical History: Past Medical History:  Diagnosis Date  . Asthma   . Atrial fibrillation ([place])   . Coronary artery disease   . Hyperlipidemia   . Hypertension     Family History: Family History  Problem Relation Age of Onset  . Diabetes Mother   . Bowel Disease Father   . Alcohol abuse Father     Social History   Socioeconomic History  . Marital status: Married    Spouse name: Not on file  .  Number of children: Not on file  . Years of education: Not on file  . Highest education level: Not on file  Occupational History  . Not on file  Tobacco Use  . Smoking status: Former Research scientist (life sciences)  . Smokeless tobacco: Never Used  Substance and Sexual Activity  . Alcohol use: No  . Drug use: No  . Sexual activity: Not on file  Other Topics Concern  . Not on file  Social History Narrative  . Not on file   Social Determinants of Health   Financial Resource Strain: Not on file  Food Insecurity: Not on file  Transportation  Needs: Not on file  Physical Activity: Not on file  Stress: Not on file  Social Connections: Not on file  Intimate Partner Violence: Not on file      Review of Systems  Constitutional: Negative for chills, fatigue and unexpected weight change.  HENT: Negative for congestion, postnasal drip, rhinorrhea, sneezing and sore throat.   Eyes: Negative for redness.  Respiratory: Positive for shortness of breath. Negative for cough and chest tightness.   Cardiovascular: Negative for chest pain and palpitations.  Gastrointestinal: Negative for abdominal pain, constipation, diarrhea, nausea and vomiting.  Genitourinary: Negative for dysuria and frequency.  Musculoskeletal: Negative for arthralgias, back pain, joint swelling and neck pain.  Skin: Negative for rash.  Neurological: Negative for tremors and numbness.  Hematological: Negative for adenopathy. Does not bruise/bleed easily.  Psychiatric/Behavioral: Negative for behavioral problems (Depression), sleep disturbance and suicidal ideas. The patient is not nervous/anxious.     Vital Signs: BP 113/73   Pulse 89   Temp 97.8 F (36.6 C)   Resp 16   Ht 5\' 8"  (1.727 m)   Wt 199 lb (90.3 kg)   SpO2 98%   BMI 30.26 kg/m    Physical Exam Vitals reviewed.  Constitutional:      Appearance: Normal appearance. He is normal weight.  Cardiovascular:     Rate and Rhythm: Normal rate and regular rhythm.     Pulses: Normal pulses.     Heart sounds: Normal heart sounds.  Pulmonary:     Effort: Pulmonary effort is normal.     Breath sounds: Normal breath sounds.  Abdominal:     General: Abdomen is flat.     Palpations: Abdomen is soft.  Musculoskeletal:        General: Normal range of motion.     Cervical back: Normal range of motion.  Skin:    General: Skin is warm.  Neurological:     General: No focal deficit present.     Mental Status: He is alert and oriented to person, place, and time. Mental status is at baseline.  Psychiatric:         Mood and Affect: Mood normal.        Behavior: Behavior normal.        Thought Content: Thought content normal.        Judgment: Judgment normal.    Assessment/Plan: 1. Essential hypertension BP and HR remain well controlled at this time on current management, continue to monitor  2. Dyspnea on exertion Undergoing extensive cardiac work-up due to shortness of breath, discussed updating his PFT but he declined at this time until all work-up was done and has had a chance to discuss findings with cardiologist  3. Esophageal dysmotility Continue with safe swallowing practices and pantoprazole  General Counseling: tymon nemetz understanding of the findings of todays visit and agrees with plan of treatment. I  have discussed any further diagnostic evaluation that may be needed or ordered today. We also reviewed his medications today. he has been encouraged to call the office with any questions or concerns that should arise related to todays visit.   Time spent: 30 Minutes Time spent includes review of chart, medications, test results and follow-up plan with the patient.  This patient was seen by Theodoro Grist AGNP-C in Collaboration with Dr Lavera Guise as a part of collaborative care agreement     Tanna Furry. Ramona Ruark AGNP-C Internal medicine

## 2020-12-28 ENCOUNTER — Encounter: Payer: Self-pay | Admitting: Hospice and Palliative Medicine

## 2020-12-28 DIAGNOSIS — I4819 Other persistent atrial fibrillation: Secondary | ICD-10-CM | POA: Diagnosis not present

## 2020-12-28 DIAGNOSIS — I10 Essential (primary) hypertension: Secondary | ICD-10-CM | POA: Diagnosis not present

## 2020-12-28 DIAGNOSIS — I25708 Atherosclerosis of coronary artery bypass graft(s), unspecified, with other forms of angina pectoris: Secondary | ICD-10-CM | POA: Diagnosis not present

## 2020-12-28 DIAGNOSIS — I351 Nonrheumatic aortic (valve) insufficiency: Secondary | ICD-10-CM | POA: Diagnosis not present

## 2020-12-28 DIAGNOSIS — E782 Mixed hyperlipidemia: Secondary | ICD-10-CM | POA: Diagnosis not present

## 2021-01-27 ENCOUNTER — Telehealth: Payer: Self-pay | Admitting: Internal Medicine

## 2021-01-27 NOTE — Progress Notes (Signed)
  Chronic Care Management   Note  01/27/2021 Name: Savon E San Marino MRN: [id number] DOB: 27-Oct-1940  Amar E San Marino is a 80 y.o. year old male who is a primary care patient of Lavera Guise, MD. I reached out to Hyman E San Marino by phone today in response to a referral sent by Mr. SHARMARKE CICIO PCP, Lavera Guise, MD.   Mr. San Marino was given information about Chronic Care Management services today including:  1. CCM service includes personalized support from designated clinical staff supervised by his physician, including individualized plan of care and coordination with other care providers 2. 24/7 contact phone numbers for assistance for urgent and routine care needs. 3. Service will only be billed when office clinical staff spend 20 minutes or more in a month to coordinate care. 4. Only one practitioner may furnish and bill the service in a calendar month. 5. The patient may stop CCM services at any time (effective at the end of the month) by phone call to the office staff.   Patient agreed to services and verbal consent obtained.   Follow up plan:  [place]

## 2021-02-06 ENCOUNTER — Other Ambulatory Visit: Payer: Self-pay

## 2021-02-10 ENCOUNTER — Other Ambulatory Visit: Payer: Self-pay | Admitting: Internal Medicine

## 2021-02-10 DIAGNOSIS — E876 Hypokalemia: Secondary | ICD-10-CM

## 2021-02-22 ENCOUNTER — Ambulatory Visit: Payer: Medicare Other | Admitting: Hospice and Palliative Medicine

## 2021-03-01 ENCOUNTER — Ambulatory Visit (INDEPENDENT_AMBULATORY_CARE_PROVIDER_SITE_OTHER): Payer: Medicare Other | Admitting: Nurse Practitioner

## 2021-03-01 ENCOUNTER — Other Ambulatory Visit: Payer: Self-pay

## 2021-03-01 VITALS — BP 122/68 | HR 77 | Temp 97.2°F | Resp 16 | Ht 68.0 in | Wt 197.0 lb

## 2021-03-01 DIAGNOSIS — J3089 Other allergic rhinitis: Secondary | ICD-10-CM

## 2021-03-01 DIAGNOSIS — Z0189 Encounter for other specified special examinations: Secondary | ICD-10-CM | POA: Diagnosis not present

## 2021-03-01 DIAGNOSIS — Z0001 Encounter for general adult medical examination with abnormal findings: Secondary | ICD-10-CM

## 2021-03-01 DIAGNOSIS — M25551 Pain in right hip: Secondary | ICD-10-CM

## 2021-03-01 DIAGNOSIS — R300 Dysuria: Secondary | ICD-10-CM

## 2021-03-01 DIAGNOSIS — J4520 Mild intermittent asthma, uncomplicated: Secondary | ICD-10-CM | POA: Diagnosis not present

## 2021-03-01 DIAGNOSIS — I10 Essential (primary) hypertension: Secondary | ICD-10-CM | POA: Diagnosis not present

## 2021-03-01 DIAGNOSIS — G8929 Other chronic pain: Secondary | ICD-10-CM

## 2021-03-01 NOTE — Progress Notes (Signed)
[facility] [place], [place] [address]  Internal MEDICINE  Office Visit Note  Patient Name: James Mathews  [id number]  [id number]  Date of Service: 03/09/2021  Chief Complaint  Patient presents with   Medicare Wellness    Review results   Hyperlipidemia   Hypertension   Asthma   Quality Metric Gaps    Shingrix, covid booster    HPI James Mathews presents for an annual well visit and physical exam. he has a history of asthma, hyperlipidemia, hypertension, coronary artery disease and atrial fibrillation. He lives at home with his wife. He is retired and had worked for the [employer] for 46 years. He has 2 adult sons and 2 young grandchildren. He has received 2 doses of the COVID vaccin and 1 booster dose. He denies any pain at this time.  - blood pressure is wnl, and he is currently taking amlodipine 10 mg daily.  - for his asthma, it is controlled, he has a rescue inhaler but is not on any maintenance inhaler. He reports having tried many of the different sample inhalers the clinic is able to provide but nothing ever worked well enough to continue. May need new PFT to determine progression of asthma.  -he has right hip pain. He reports that he knows it is arthritis and he will not have surgery unless it is an absolute last resort. Currently tylenol and motrin as needed manage the pain well for him.    .   Current Medication: Outpatient Encounter Medications as of 03/01/2021  Medication Sig   albuterol (VENTOLIN HFA) 108 (90 Base) MCG/ACT inhaler INHALE 2 PUFFS INTO THE LUNGS EVERY 6 HOURS AS NEEDED FOR WHEEZING OR SHORTNESS OF BREATH   amLODipine (NORVASC) 10 MG tablet Take 10 mg by mouth daily.   aspirin 325 MG EC tablet Take 325 mg by mouth daily.   atorvastatin (LIPITOR) 20 MG tablet Take 20 mg by mouth daily.   benzonatate (TESSALON) 100 MG capsule Take 1 capsule (100 mg total) by mouth 2 (two) times daily as needed for cough.    Calcium-Magnesium-Vitamin D 400-166.7-133.3 MG-MG-UNIT TABS Take by mouth.   cholecalciferol (VITAMIN D3) 10 MCG (400 UNIT) TABS tablet Take 400 Units by mouth.   cyanocobalamin 1000 MCG tablet Take 1,000 mcg by mouth daily.   desloratadine (CLARINEX) 5 MG tablet Take 1 tablet (5 mg total) by mouth daily.   fluticasone (FLONASE) 50 MCG/ACT nasal spray SHAKE LIQUID AND USE 2 SPRAYS IN EACH NOSTRIL DAILY   montelukast (SINGULAIR) 10 MG tablet TAKE 1 TABLET BY MOUTH DAILY FOR CHRONIC ALLERGIC RHINITIS/ASTHMA   Multiple Vitamins-Minerals (MULTIVITAMIN ADULT PO) Take by mouth daily.   pantoprazole (PROTONIX) 40 MG tablet Take 1 tablet (40 mg total) by mouth daily.   potassium chloride (KLOR-CON) 10 MEQ tablet TAKE 1 TABLET BY MOUTH DAILY   vitamin C (ASCORBIC ACID) 500 MG tablet Take 500 mg by mouth daily.   [DISCONTINUED] acetaminophen (TYLENOL) 325 MG tablet Take 650 mg by mouth every 6 (six) hours as needed. (Patient not taking: Reported on 03/01/2021)   [DISCONTINUED] metoCLOPramide (REGLAN) 5 MG tablet Take 1 tablet (5 mg total) by mouth 3 (three) times daily before meals. (Patient not taking: Reported on 03/01/2021)   No facility-administered encounter medications on file as of 03/01/2021.    Surgical History: Past Surgical History:  Procedure Laterality Date   cartaract surgery both eye Bilateral    CORONARY ARTERY BYPASS GRAFT Bilateral    CYSTOSCOPY  HERNIA REPAIR     knee replacment Bilateral    retina surgery on right eye  Right 05/16/2017   TONSILLECTOMY Bilateral     Medical History: Past Medical History:  Diagnosis Date   Asthma    Atrial fibrillation ([place])    Coronary artery disease    Hyperlipidemia    Hypertension     Family History: Family History  Problem Relation Age of Onset   Diabetes Mother    Bowel Disease Father    Alcohol abuse Father     Social History   Socioeconomic History   Marital status: Married    Spouse name: Not on file   Number of  children: Not on file   Years of education: Not on file   Highest education level: Not on file  Occupational History   Not on file  Tobacco Use   Smoking status: Former    Pack years: 0.00   Smokeless tobacco: Never  Substance and Sexual Activity   Alcohol use: No   Drug use: No   Sexual activity: Not on file  Other Topics Concern   Not on file  Social History Narrative   Not on file   Social Determinants of Health   Financial Resource Strain: Not on file  Food Insecurity: Not on file  Transportation Needs: Not on file  Physical Activity: Not on file  Stress: Not on file  Social Connections: Not on file  Intimate Partner Violence: Not on file      Review of Systems  Constitutional:  Negative for activity change, appetite change, chills, fatigue, fever and unexpected weight change.  HENT: Negative.  Negative for congestion, ear pain, rhinorrhea, sore throat and trouble swallowing.   Eyes: Negative.   Respiratory: Negative.  Negative for cough, chest tightness, shortness of breath and wheezing.   Cardiovascular: Negative.  Negative for chest pain.  Gastrointestinal: Negative.  Negative for abdominal pain, blood in stool, constipation, diarrhea, nausea and vomiting.  Endocrine: Negative.   Genitourinary: Negative.  Negative for difficulty urinating, dysuria, frequency, hematuria and urgency.  Musculoskeletal: Negative.  Negative for arthralgias, back pain, joint swelling, myalgias and neck pain.  Skin: Negative.  Negative for rash and wound.  Allergic/Immunologic: Negative.  Negative for immunocompromised state.  Neurological: Negative.  Negative for dizziness, seizures, numbness and headaches.  Hematological: Negative.   Psychiatric/Behavioral: Negative.  Negative for behavioral problems, self-injury and suicidal ideas. The patient is not nervous/anxious.    Vital Signs: BP 122/68   Pulse 77   Temp (!) 97.2 F (36.2 C)   Resp 16   Ht 5' 8"  (1.727 m)   Wt 197 lb  (89.4 kg)   SpO2 98%   BMI 29.95 kg/m    Physical Exam Vitals reviewed.  Constitutional:      General: He is not in acute distress.    Appearance: Normal appearance. He is well-developed. He is not ill-appearing or diaphoretic.  HENT:     Head: Normocephalic and atraumatic.     Right Ear: Tympanic membrane, ear canal and external ear normal.     Left Ear: Tympanic membrane, ear canal and external ear normal.     Nose: Nose normal. No congestion or rhinorrhea.     Mouth/Throat:     Mouth: Mucous membranes are moist.     Pharynx: Oropharynx is clear. No oropharyngeal exudate or posterior oropharyngeal erythema.  Eyes:     Extraocular Movements: Extraocular movements intact.     Conjunctiva/sclera: Conjunctivae normal.  Pupils: Pupils are equal, round, and reactive to light.  Neck:     Thyroid: No thyromegaly.     Vascular: No carotid bruit or JVD.     Trachea: No tracheal deviation.  Cardiovascular:     Rate and Rhythm: Normal rate and regular rhythm.     Pulses: Normal pulses.     Heart sounds: Normal heart sounds. No murmur heard.   No friction rub. No gallop.  Pulmonary:     Effort: Pulmonary effort is normal. No respiratory distress.     Breath sounds: Normal breath sounds. No wheezing or rales.  Chest:     Chest wall: No tenderness.  Abdominal:     General: Bowel sounds are normal. There is no distension.     Palpations: Abdomen is soft. There is no mass.     Tenderness: There is no abdominal tenderness. There is no guarding or rebound.     Hernia: No hernia is present.  Musculoskeletal:        General: Normal range of motion.     Cervical back: Normal range of motion and neck supple.  Lymphadenopathy:     Cervical: No cervical adenopathy.  Skin:    General: Skin is warm and dry.     Capillary Refill: Capillary refill takes less than 2 seconds.  Neurological:     Mental Status: He is alert and oriented to person, place, and time.     Cranial Nerves: No  cranial nerve deficit.  Psychiatric:        Mood and Affect: Mood normal.        Behavior: Behavior normal.        Thought Content: Thought content normal.        Judgment: Judgment normal.       Assessment/Plan: 1. Encounter for general adult medical examination with abnormal findings Age-appropriate preventive screenings discussed, annual physical exam completed. His most recent echocardiogram and carotid US was in July 2021. The carotid US showed less than 50% stenosis bilaterally. 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 Consider repeat at 1 year.   2. Encounter for routine laboratory testing Routine labs ordered. Some labs were already done recently. - CMP14+EGFR - Lipid Panel With LDL/HDL Ratio  3. Chronic right hip pain Reports chronic hip pain, manageable with OTC medications, does not bother him enough to see ortho. He reports that he knows it is arthritis and that he will eventually need a hip replacement but that is an absolute last resort for him.   4. Essential hypertension Blood pressure well controlled with amlodipine 10 mg daily.   5. Mild intermittent asthma without complication Has history of trying several different inhalers, and only has his rescue inhaler now. He reports that his breathing is good, denies SOB, and uses the inhaler occasionally if he needs it.   6. Allergic rhinitis due to other allergic trigger, unspecified seasonality Takes desloratadine and fluticasone nasal spray. This combination works well per patient.   7. Dysuria Routine urinalysis done.  - UA/M w/rflx Culture, Routine - Microscopic Examination      General Counseling: daxon kyne understanding of the findings of todays visit and agrees with plan of treatment. I have discussed any further diagnostic evaluation that may be needed or ordered today. We also  reviewed his medications today. he has been encouraged to call the office with any questions or concerns that should  arise related to todays visit.    Orders Placed This Encounter  Procedures   Microscopic Examination   UA/M w/rflx Culture, Routine   CMP14+EGFR   Lipid Panel With LDL/HDL Ratio    No orders of the defined types were placed in this encounter.   Return in about 6 months (around 08/31/2021) for F/U, med refill, Danna Casella PCP.   Total time spent:30 Minutes Time spent includes review of chart, medications, test results, and follow up plan with the patient.   [place] Controlled Substance Database was reviewed by me.  This patient was seen by Jonetta Osgood, FNP-C in collaboration with Dr. Clayborn Bigness as a part of collaborative care agreement.  Esaiah Wanless R. Valetta Fuller, MSN, FNP-C Internal medicine

## 2021-03-02 DIAGNOSIS — Z0189 Encounter for other specified special examinations: Secondary | ICD-10-CM | POA: Diagnosis not present

## 2021-03-02 LAB — UA/M W/RFLX CULTURE, ROUTINE
Bilirubin, UA: NEGATIVE
Glucose, UA: NEGATIVE
Ketones, UA: NEGATIVE
Leukocytes,UA: NEGATIVE
Nitrite, UA: NEGATIVE
RBC, UA: NEGATIVE
Specific Gravity, UA: 1.019 (ref 1.005–1.030)
Urobilinogen, Ur: 0.2 mg/dL (ref 0.2–1.0)
pH, UA: 6.5 (ref 5.0–7.5)

## 2021-03-02 LAB — MICROSCOPIC EXAMINATION
Bacteria, UA: NONE SEEN
Casts: NONE SEEN /lpf
Epithelial Cells (non renal): NONE SEEN /hpf (ref 0–10)
RBC, Urine: NONE SEEN /hpf (ref 0–2)
WBC, UA: NONE SEEN /hpf (ref 0–5)

## 2021-03-03 LAB — LIPID PANEL WITH LDL/HDL RATIO
Cholesterol, Total: 107 mg/dL (ref 100–199)
HDL: 37 mg/dL — ABNORMAL LOW (ref >39)
LDL Chol Calc (NIH): 50 mg/dL (ref 0–99)
LDL/HDL Ratio: 1.4 ratio (ref 0.0–3.6)
Triglycerides: 104 mg/dL (ref 0–149)
VLDL Cholesterol Cal: 20 mg/dL (ref 5–40)

## 2021-03-03 LAB — CMP14+EGFR
ALT: 19 IU/L (ref 0–44)
AST: 25 IU/L (ref 0–40)
Albumin/Globulin Ratio: 2.4 — ABNORMAL HIGH (ref 1.2–2.2)
Albumin: 4.6 g/dL (ref 3.6–4.6)
Alkaline Phosphatase: 72 IU/L (ref 44–121)
BUN/Creatinine Ratio: 13 (ref 10–24)
BUN: 12 mg/dL (ref 8–27)
Bilirubin Total: 0.6 mg/dL (ref 0.0–1.2)
CO2: 23 mmol/L (ref 20–29)
Calcium: 9.1 mg/dL (ref 8.6–10.2)
Chloride: 106 mmol/L (ref 96–106)
Creatinine, Ser: 0.96 mg/dL (ref 0.76–1.27)
Globulin, Total: 1.9 g/dL (ref 1.5–4.5)
Glucose: 154 mg/dL — ABNORMAL HIGH (ref 65–99)
Potassium: 4 mmol/L (ref 3.5–5.2)
Sodium: 142 mmol/L (ref 134–144)
Total Protein: 6.5 g/dL (ref 6.0–8.5)
eGFR: 79 mL/min/{1.73_m2} (ref >59)

## 2021-03-18 ENCOUNTER — Telehealth: Payer: Self-pay

## 2021-03-18 NOTE — Telephone Encounter (Signed)
Spoke with patient about labs that they were normal besides glucose level was elevated and HDL levels were high, told patient he can get fish oil supplement to and he agreed.LNB

## 2021-03-19 ENCOUNTER — Other Ambulatory Visit: Payer: Self-pay | Admitting: Nurse Practitioner

## 2021-03-19 DIAGNOSIS — J3089 Other allergic rhinitis: Secondary | ICD-10-CM

## 2021-03-22 ENCOUNTER — Telehealth: Payer: Self-pay | Admitting: Pharmacist

## 2021-03-22 NOTE — Progress Notes (Addendum)
    Chronic Care Management Pharmacy Assistant   Name: James Mathews  MRN: [id number] DOB: [birth year]  James Mathews is an 80 y.o. year old male who presents for his initial CCM visit with the clinical pharmacist.  Reason for Encounter: Chart Prep   Conditions to be addressed/monitored: HTN, CAD, GERD, HLD.  Primary concerns for visit include: HTN.  Recent office visits:  12/23/20 Luiz Ochoa, NP. For follow-up. No medication changes. 11/10/20 Luiz Ochoa, NP. For follow-up. No medication changes.  Recent consult visits:  12/28/20 Cardiology Flossie Dibble. For follow-up. No information given. 11/23/20 Cardiology Flossie Dibble. For CAD. No information given. 10/29/20 [facility] [place]. For discuss having an EGD.   Hospital visits:  None in previous 6 months  Medication History: Atorvastatin 20 mg 90 DS 03/19/21  Medications: Outpatient Encounter Medications as of 03/22/2021  Medication Sig   albuterol (VENTOLIN HFA) 108 (90 Base) MCG/ACT inhaler INHALE 2 PUFFS INTO THE LUNGS EVERY 6 HOURS AS NEEDED FOR WHEEZING OR SHORTNESS OF BREATH   amLODipine (NORVASC) 10 MG tablet Take 10 mg by mouth daily.   aspirin 325 MG EC tablet Take 325 mg by mouth daily.   atorvastatin (LIPITOR) 20 MG tablet Take 20 mg by mouth daily.   benzonatate (TESSALON) 100 MG capsule Take 1 capsule (100 mg total) by mouth 2 (two) times daily as needed for cough.   Calcium-Magnesium-Vitamin D 400-166.7-133.3 MG-MG-UNIT TABS Take by mouth.   cholecalciferol (VITAMIN D3) 10 MCG (400 UNIT) TABS tablet Take 400 Units by mouth.   cyanocobalamin 1000 MCG tablet Take 1,000 mcg by mouth daily.   desloratadine (CLARINEX) 5 MG tablet Take 1 tablet (5 mg total) by mouth daily.   fluticasone (FLONASE) 50 MCG/ACT nasal spray SHAKE LIQUID AND USE 2 SPRAYS IN EACH NOSTRIL DAILY   montelukast (SINGULAIR) 10 MG tablet TAKE 1 TABLET BY MOUTH DAILY FOR CHRONIC ALLERGIC  RHINITIS/ASTHMA   Multiple Vitamins-Minerals (MULTIVITAMIN ADULT PO) Take by mouth daily.   pantoprazole (PROTONIX) 40 MG tablet Take 1 tablet (40 mg total) by mouth daily.   potassium chloride (KLOR-CON) 10 MEQ tablet TAKE 1 TABLET BY MOUTH DAILY   vitamin C (ASCORBIC ACID) 500 MG tablet Take 500 mg by mouth daily.   No facility-administered encounter medications on file as of 03/22/2021.   Have you seen any other providers since your last visit? Patient stated no.  Any changes in your medications or health? Patient stated no.  Any side effects from any medications? Patient stated no.  Do you have an symptoms or problems not managed by your medications? Patient stated no.  Any concerns about your health right now? Patient stated no.  Has your provider asked that you check blood pressure, blood sugar, or follow special diet at home? Patient stated he monitors his blood pressure every so often.  Do you get any type of exercise on a regular basis? Patient stated no.  Can you think of a goal you would like to reach for your health? Patient stated no.  Do you have any problems getting your medications? Patient stated no.  Is there anything that you would like to discuss during the appointment? Patient stated no.  Please bring medications and supplements to appointment  [place], [place] Pharmacist Assistant [phone]

## 2021-03-23 ENCOUNTER — Ambulatory Visit: Payer: Medicare Other | Admitting: Pharmacist

## 2021-03-23 DIAGNOSIS — I480 Paroxysmal atrial fibrillation: Secondary | ICD-10-CM

## 2021-03-23 DIAGNOSIS — J309 Allergic rhinitis, unspecified: Secondary | ICD-10-CM

## 2021-03-23 DIAGNOSIS — E782 Mixed hyperlipidemia: Secondary | ICD-10-CM

## 2021-03-23 DIAGNOSIS — I10 Essential (primary) hypertension: Secondary | ICD-10-CM

## 2021-03-23 NOTE — Progress Notes (Signed)
Chronic Care Management Pharmacy Note  03/23/2021 Name:  James Mathews MRN:  [id number] DOB:  [birth year]  Summary: Initial visit with PharmD for med review and CCM.  All medications updated in chart.  BP has been controlled lately and denies swelling.  Discussed increasing physical activity once he helps wife recover from surgery.  Recommendations/Changes made from today's visit: No changes recommended at this time  Plan: FU via telephone in 4 months   Subjective: James Mathews is an 80 y.o. year old male who is a primary patient of Humphrey Rolls, Timoteo Gaul, MD.  The CCM team was consulted for assistance with disease management and care coordination needs.    Engaged with patient by telephone for initial visit in response to provider referral for pharmacy case management and/or care coordination services.   Consent to Services:  The patient was given the following information about Chronic Care Management services today, agreed to services, and gave verbal consent: 1. CCM service includes personalized support from designated clinical staff supervised by the primary care provider, including individualized plan of care and coordination with other care providers 2. 24/7 contact phone numbers for assistance for urgent and routine care needs. 3. Service will only be billed when office clinical staff spend 20 minutes or more in a month to coordinate care. 4. Only one practitioner may furnish and bill the service in a calendar month. 5.The patient may stop CCM services at any time (effective at the end of the month) by phone call to the office staff. 6. The patient will be responsible for cost sharing (co-pay) of up to 20% of the service fee (after annual deductible is met). Patient agreed to services and consent obtained.  Patient Care Team: Lavera Guise, MD as PCP - General (Internal Medicine) Edythe Clarity, [facility] (Pharmacist) Edythe Clarity, [facility] as Pharmacist (Pharmacist)  Recent  office visits:  12/23/20 Luiz Ochoa, NP. For follow-up. No medication changes. 11/10/20 Luiz Ochoa, NP. For follow-up. No medication changes.   Recent consult visits:  12/28/20 Cardiology Flossie Dibble. For follow-up. No information given. 11/23/20 Cardiology Flossie Dibble. For CAD. No information given. 10/29/20 [facility], [place]. For discuss having an EGD.    Hospital visits:  None in previous 6 months   Medication History: Atorvastatin 20 mg 90 DS 03/19/21   Objective:  Lab Results  Component Value Date   CREATININE 0.96 03/02/2021   BUN 12 03/02/2021   GFRNONAA 69 02/18/2020   GFRAA 80 02/18/2020   NA 142 03/02/2021   K 4.0 03/02/2021   CALCIUM 9.1 03/02/2021   CO2 23 03/02/2021   GLUCOSE 154 (H) 03/02/2021    No results found for: HGBA1C, FRUCTOSAMINE, GFR, MICROALBUR  Last diabetic Eye exam: No results found for: HMDIABEYEEXA  Last diabetic Foot exam: No results found for: HMDIABFOOTEX   Lab Results  Component Value Date   CHOL 107 03/02/2021   HDL 37 (L) 03/02/2021   LDLCALC 50 03/02/2021   TRIG 104 03/02/2021    Hepatic Function Latest Ref Rng & Units 03/02/2021 02/18/2020 02/15/2019  Total Protein 6.0 - 8.5 g/dL 6.5 6.7 6.5  Albumin 3.6 - 4.6 g/dL 4.6 4.7 4.5  AST 0 - 40 IU/L 25 23 17   ALT 0 - 44 IU/L 19 16 13   Alk Phosphatase 44 - 121 IU/L 72 79 59  Total Bilirubin 0.0 - 1.2 mg/dL 0.6 0.5 0.5    Lab Results  Component Value Date/Time  TSH 3.030 02/18/2020 11:41 AM   TSH 3.030 07/01/2019 01:58 PM   FREET4 1.09 02/18/2020 11:41 AM   FREET4 1.14 07/01/2019 01:58 PM    CBC Latest Ref Rng & Units 02/18/2020 02/15/2019 07/03/2018  WBC 3.4 - 10.8 x10E3/uL 8.1 6.6 7.0  Hemoglobin 13.0 - 17.7 g/dL 14.2 13.7 12.5(L)  Hematocrit 37.5 - 51.0 % 43.4 39.8 37.9(L)  Platelets 150 - 450 x10E3/uL 226 236 156    Lab Results  Component Value Date/Time   VD25OH 44.6 02/18/2020 11:41 AM    Clinical ASCVD: Yes  The ASCVD  Risk score Mikey Bussing DC Jr., et al., 2013) failed to calculate for the following reasons:   The 2013 ASCVD risk score is only valid for ages 39 to 14    Depression screen PHQ 2/9 03/01/2021 12/23/2020 11/10/2020  Decreased Interest 0 0 0  Down, Depressed, Hopeless 0 0 0  PHQ - 2 Score 0 0 0      Social History   Tobacco Use  Smoking Status Former   Pack years: 0.00  Smokeless Tobacco Never   BP Readings from Last 3 Encounters:  03/01/21 122/68  12/23/20 113/73  11/10/20 122/82   Pulse Readings from Last 3 Encounters:  03/01/21 77  12/23/20 89  11/10/20 67   Wt Readings from Last 3 Encounters:  03/01/21 197 lb (89.4 kg)  12/23/20 199 lb (90.3 kg)  11/10/20 199 lb 3.2 oz (90.4 kg)   BMI Readings from Last 3 Encounters:  03/01/21 29.95 kg/m  12/23/20 30.26 kg/m  11/10/20 30.29 kg/m    Assessment/Interventions: Review of patient past medical history, allergies, medications, health status, including review of consultants reports, laboratory and other test data, was performed as part of comprehensive evaluation and provision of chronic care management services.   SDOH:  (Social Determinants of Health) assessments and interventions performed: Yes  Financial Resource Strain: Low Risk    Difficulty of Paying Living Expenses: Not very hard    SDOH Screenings   Alcohol Screen: Not on file  Depression (PHQ2-9): Low Risk    PHQ-2 Score: 0  Financial Resource Strain: Low Risk    Difficulty of Paying Living Expenses: Not very hard  Food Insecurity: Not on file  Housing: Not on file  Physical Activity: Not on file  Social Connections: Not on file  Stress: Not on file  Tobacco Use: Medium Risk   Smoking Tobacco Use: Former   Smokeless Tobacco Use: Never  Transportation Needs: Not on file    [place]  No Known Allergies  Medications Reviewed Today     Reviewed by Edythe Clarity, [place] (Pharmacist) on 03/23/21 at 1453  Med List Status: <None>   Medication Order  Taking? Sig Documenting Provider Last Dose Status Informant  albuterol (VENTOLIN HFA) 108 (90 Base) MCG/ACT inhaler [id number] Yes INHALE 2 PUFFS INTO THE LUNGS EVERY 6 HOURS AS NEEDED FOR WHEEZING OR SHORTNESS OF BREATH Lavera Guise, MD Taking Active   amLODipine (NORVASC) 10 MG tablet [id number] Yes Take 10 mg by mouth daily. [provider] Taking Active   aspirin 325 MG EC tablet [id number] Yes Take 325 mg by mouth daily. [provider] Taking Active   atorvastatin (LIPITOR) 20 MG tablet [id number] Yes Take 20 mg by mouth daily. [provider] Taking Active   benzonatate (TESSALON) 100 MG capsule [id number] No Take 1 capsule (100 mg total) by mouth 2 (two) times daily as needed for cough.  Patient not taking: Reported on 03/23/2021  Kendell Bane, NP Not Taking Active   Calcium-Magnesium-Vitamin D 400-166.7-133.3 MG-MG-UNIT TABS [id number] Yes Take by mouth. [provider] Taking Active   cholecalciferol (VITAMIN D3) 10 MCG (400 UNIT) TABS tablet [id number] Yes Take 400 Units by mouth. [provider] Taking Active   cyanocobalamin 1000 MCG tablet [id number] No Take 1,000 mcg by mouth daily.  Patient not taking: Reported on 03/23/2021   [provider] Not Taking Active   desloratadine (CLARINEX) 5 MG tablet [id number] Yes Take 1 tablet (5 mg total) by mouth daily. Ronnell Freshwater, NP Taking Active   fluticasone (FLONASE) 50 MCG/ACT nasal spray [id number] Yes SHAKE LIQUID AND USE 2 SPRAYS IN [facility] NOSTRIL DAILY Lavera Guise, MD Taking Active   montelukast (SINGULAIR) 10 MG tablet [id number] Yes TAKE 1 TABLET BY MOUTH DAILY FOR CHRONIC ALLERGIC RHINITIS/ASTHMA Ronnell Freshwater, NP Taking Active   Multiple Vitamins-Minerals (MULTIVITAMIN ADULT PO) [id number] Yes Take by mouth daily. [provider] Taking Active   pantoprazole (PROTONIX) 40 MG tablet [id number] Yes Take 1 tablet (40 mg total) by mouth daily. Lavera Guise, MD Taking Active    potassium chloride (KLOR-CON) 10 MEQ tablet [id number] Yes TAKE 1 TABLET BY MOUTH DAILY Lavera Guise, MD Taking Active   vitamin C (ASCORBIC ACID) 500 MG tablet [id number] Yes Take 500 mg by mouth daily. [provider] Taking Active             Patient Active Problem List   Diagnosis Date Noted   Esophageal dysphagia 07/31/2020   Paroxysmal atrial fibrillation (HCC) 07/19/2020   Hypokalemia 07/24/2019   Acute non-recurrent frontal sinusitis 12/10/2018   Leg swelling 12/10/2018   Recurrent sinus infections 04/22/2018   Vitamin B12 deficiency 03/18/2018   Fatigue 02/26/2018   Dizziness 02/26/2018   Cough 01/02/2018   Shortness of breath 01/02/2018   Acute recurrent pansinusitis 11/23/2017   Gastroesophageal reflux disease without esophagitis 11/23/2017   Allergic rhinitis 11/23/2017   Mild intermittent asthma with acute exacerbation 11/23/2017   CAD (coronary artery disease) 09/28/2017   Hyperlipidemia, mixed 09/28/2017   Bilateral carotid artery stenosis 06/25/2015   Essential hypertension 06/17/2015   Peripheral vascular disease ([place]) 05/27/2014    Immunization History  Administered Date(s) Administered   Influenza Inj Mdck Quad Pf 06/09/2020   Influenza, High Dose Seasonal PF 07/02/2018   Influenza-Unspecified 06/19/2017   PFIZER(Purple Top)SARS-COV-2 Vaccination 09/26/2019, 10/17/2019, 06/11/2020   Pneumococcal Conjugate-13 08/17/2017   Pneumococcal Polysaccharide-23 05/18/2015, 03/26/2020   Tetanus 06/19/2015   Zoster, Live 06/12/2015    Conditions to be addressed/monitored:  HTN, CAD, Afib, Allergic rhinitis, GERD, HLD,   Care Plan : General Pharmacy (Adult)  Updates made by Edythe Clarity, RPH since 03/23/2021 12:00 AM     Problem: HTN, CAD, Afib, Allergic rhinitis, GERD, HLD,   Priority: High  Onset Date: 03/23/2021     Long-Range Goal: Patient-Specific Goal   Start Date: 03/23/2021  Expected End Date: 09/23/2021  This Visit's Progress: On  track  Priority: High  Note:   Current Barriers:  None noted at this time  Pharmacist Clinical Goal(s):  Patient will maintain control of BP and lipids as evidenced by monitoring/labs  adhere to prescribed medication regimen as evidenced by fill dates contact provider office for questions/concerns as evidenced notation of same in electronic health record through collaboration with PharmD and provider.   Interventions: 1:1 collaboration with Lavera Guise, MD regarding development and update of comprehensive plan of care as evidenced by provider attestation  and co-signature Inter-disciplinary care team collaboration (see longitudinal plan of care) Comprehensive medication review performed; medication list updated in electronic medical record  Hypertension (BP goal <140/90) -Controlled -Current treatment: Amlodipine 64m daily -Medications previously tried: lisinopril  -Current home readings: 118/80s  -Current exercise habits: minimal right now, taking care of wife post surgery.  Is a member of a gym ([employer] and plans to get back to exercising once she has recovered. -Denies hypotensive/hypertensive symptoms -Educated on BP goals and benefits of medications for prevention of heart attack, stroke and kidney damage; Exercise goal of 150 minutes per week; Importance of home blood pressure monitoring; Symptoms of hypotension and importance of maintaining adequate hydration; -Counseled to monitor BP at home a few times weekly, document, and provide log at future appointments -Has hx of lower Bps for unknown reason, will go away and then he will not experience it for a while.  -Counseled on diet and exercise extensively Recommended to continue current medication  Hyperlipidemia/CAD: (LDL goal < 70) -Controlled -Current treatment: Atorvastatin 277mdaily -Medications previously tried: none noted  LDL well controlled at recent lipid panel -Educated on Cholesterol goals;   Benefits of statin for ASCVD risk reduction; -Recommended to continue current medication  Atrial Fibrillation (Goal: prevent stroke and major bleeding) -Controlled  -Current treatment: Rate control: None Anticoagulation: ASA 32536mMedications previously tried: none noted -Home BP and HR readings: see HTN  -Counseled on increased risk of stroke due to Afib and benefits of anticoagulation for stroke prevention; importance of regular laboratory monitoring; Afib action plan reviewed -Managed by cardiology at the moment without beta blocker.  And cardiology is following this per past notes. -Counseled on diet and exercise extensively Recommended to continue current medication  Allergic rhinitis/SOB (Goal: Minimize symptoms) -Controlled -Current treatment  Desloratadine 5mg56mily Fluticasone 50mc7mily prn Montelukast 10mg 18my -Medications previously tried: none noted  -Symptoms recently have not been bad, however a few weeks ago he was SOB and symptoms increased having to use rescue inhaler multiple times per day.  Not on maintenance inhaler regularly.  Will continue to follow - could assess for eosinophilic asthma.  -Recommended to continue current medication  GERD (Goal: Control symptoms) -Controlled -Current treatment  Pantoprazole 40mg d38m -Medications previously tried: none noted -Has had periods without this medication due to cost.  Symptoms worsened tremendously.  Have since resolved since he resumed this medication.  -Recommended to continue current medication Recommended working on trigger foods.   Patient Goals/Self-Care Activities Patient will:  - take medications as prescribed focus on medication adherence by pill box target a minimum of 150 minutes of moderate intensity exercise weekly  Follow Up Plan: The care management team will reach out to the patient again over the next 120 days.        Medication Assistance: None required.  Patient affirms  current coverage meets needs.  Compliance/Adherence/Medication fill history: Care Gaps: Zoster   Star-Rating Drugs: Atorvastatin 20 mg 90 DS 03/19/21  [id number] [id number] [id number] [phone] box? Yes Pt endorses 100% compliance  We discussed: Benefits of medication synchronization, packaging and delivery as well as enhanced pharmacist oversight with [employer]. Patient decided to: Continue current medication management strategy  Care Plan and Follow Up Patient Decision:  Patient agrees to Care Plan and Follow-up.  Plan: The care  management team will reach out to the patient again over the next 120 days.  Beverly Milch, PharmD Clinical Pharmacist [facility] [phone]

## 2021-03-23 NOTE — Patient Instructions (Addendum)
Visit Information   Goals Addressed             This Visit's Progress    Track and Manage My Blood Pressure-Hypertension       Timeframe:  Long-Range Goal Priority:  High Start Date:    03/23/2021                         Expected End Date: 09/23/20                      Follow Up Date 06/11/21    - check blood pressure 3 times per week - choose a place to take my blood pressure (home, clinic or office, retail store) - write blood pressure results in a log or diary    Why is this important?   You won't feel high blood pressure, but it can still hurt your blood vessels.  High blood pressure can cause heart or kidney problems. It can also cause a stroke.  Making lifestyle changes like losing a little weight or eating less salt will help.  Checking your blood pressure at home and at different times of the day can help to control blood pressure.  If the doctor prescribes medicine remember to take it the way the doctor ordered.  Call the office if you cannot afford the medicine or if there are questions about it.     Notes:         Patient Care Plan: General Pharmacy (Adult)     Problem Identified: HTN, CAD, Afib, Allergic rhinitis, GERD, HLD,   Priority: High  Onset Date: 03/23/2021     Long-Range Goal: Patient-Specific Goal   Start Date: 03/23/2021  Expected End Date: 09/23/2021  This Visit's Progress: On track  Priority: High  Note:   Current Barriers:  None noted at this time  Pharmacist Clinical Goal(s):  Patient will maintain control of BP and lipids as evidenced by monitoring/labs  adhere to prescribed medication regimen as evidenced by fill dates contact provider office for questions/concerns as evidenced notation of same in electronic health record through collaboration with PharmD and provider.   Interventions: 1:1 collaboration with Lavera Guise, MD regarding development and update of comprehensive plan of care as evidenced by provider attestation and  co-signature Inter-disciplinary care team collaboration (see longitudinal plan of care) Comprehensive medication review performed; medication list updated in electronic medical record  Hypertension (BP goal <140/90) -Controlled -Current treatment: Amlodipine 10mg  daily -Medications previously tried: lisinopril  -Current home readings: 118/80s  -Current exercise habits: minimal right now, taking care of wife post surgery.  Is a member of a gym [employer]) and plans to get back to exercising once she has recovered. -Denies hypotensive/hypertensive symptoms -Educated on BP goals and benefits of medications for prevention of heart attack, stroke and kidney damage; Exercise goal of 150 minutes per week; Importance of home blood pressure monitoring; Symptoms of hypotension and importance of maintaining adequate hydration; -Counseled to monitor BP at home a few times weekly, document, and provide log at future appointments -Has hx of lower Bps for unknown reason, will go away and then he will not experience it for a while.  -Counseled on diet and exercise extensively Recommended to continue current medication  Hyperlipidemia/CAD: (LDL goal < 70) -Controlled -Current treatment: Atorvastatin 20mg  daily -Medications previously tried: none noted  LDL well controlled at recent lipid panel -Educated on Cholesterol goals;  Benefits of statin for ASCVD risk reduction; -  Recommended to continue current medication  Atrial Fibrillation (Goal: prevent stroke and major bleeding) -Controlled  -Current treatment: Rate control: None Anticoagulation: ASA 325mg  -Medications previously tried: none noted -Home BP and HR readings: see HTN  -Counseled on increased risk of stroke due to Afib and benefits of anticoagulation for stroke prevention; importance of regular laboratory monitoring; Afib action plan reviewed -Managed by cardiology at the moment without beta blocker.  And cardiology is  following this per past notes. -Counseled on diet and exercise extensively Recommended to continue current medication  Allergic rhinitis/SOB (Goal: Minimize symptoms) -Controlled -Current treatment  Desloratadine 5mg  daily Fluticasone 51mcg daily prn Montelukast 10mg  daily -Medications previously tried: none noted  -Symptoms recently have not been bad, however a few weeks ago he was SOB and symptoms increased having to use rescue inhaler multiple times per day.  Not on maintenance inhaler regularly.  Will continue to follow - could assess for eosinophilic asthma.  -Recommended to continue current medication  GERD (Goal: Control symptoms) -Controlled -Current treatment  Pantoprazole 40mg  daily -Medications previously tried: none noted -Has had periods without this medication due to cost.  Symptoms worsened tremendously.  Have since resolved since he resumed this medication.  -Recommended to continue current medication Recommended working on trigger foods.   Patient Goals/Self-Care Activities Patient will:  - take medications as prescribed focus on medication adherence by pill box target a minimum of 150 minutes of moderate intensity exercise weekly  Follow Up Plan: The care management team will reach out to the patient again over the next 120 days.       James Mathews was given information about Chronic Care Management services today including:  CCM service includes personalized support from designated clinical staff supervised by his physician, including individualized plan of care and coordination with other care providers 24/7 contact phone numbers for assistance for urgent and routine care needs. Standard insurance, coinsurance, copays and deductibles apply for chronic care management only during months in which we provide at least 20 minutes of these services. Most insurances cover these services at 100%, however patients may be responsible for any copay, coinsurance and/or  deductible if applicable. This service may help you avoid the need for more expensive face-to-face services. Only one practitioner may furnish and bill the service in a calendar month. The patient may stop CCM services at any time (effective at the end of the month) by phone call to the office staff.  Patient agreed to services and verbal consent obtained.   Patient verbalizes understanding of instructions provided today and agrees to view in [place].  Telephone follow up appointment with pharmacy team member scheduled for: 4 months  Edythe Clarity, [place]

## 2021-03-24 ENCOUNTER — Ambulatory Visit: Payer: Medicare Other | Admitting: Nurse Practitioner

## 2021-03-31 ENCOUNTER — Other Ambulatory Visit: Payer: Self-pay | Admitting: Nurse Practitioner

## 2021-03-31 DIAGNOSIS — J3089 Other allergic rhinitis: Secondary | ICD-10-CM

## 2021-04-02 ENCOUNTER — Other Ambulatory Visit: Payer: Self-pay | Admitting: Internal Medicine

## 2021-04-02 ENCOUNTER — Encounter: Payer: Self-pay | Admitting: Nurse Practitioner

## 2021-04-02 DIAGNOSIS — J3089 Other allergic rhinitis: Secondary | ICD-10-CM

## 2021-04-02 MED ORDER — DESLORATADINE 5 MG PO TABS
5.0000 mg | ORAL_TABLET | Freq: Every day | ORAL | 3 refills | Status: DC
Start: 2021-04-02 — End: 2021-11-24

## 2021-04-11 DIAGNOSIS — E782 Mixed hyperlipidemia: Secondary | ICD-10-CM | POA: Diagnosis not present

## 2021-04-11 DIAGNOSIS — K219 Gastro-esophageal reflux disease without esophagitis: Secondary | ICD-10-CM | POA: Diagnosis not present

## 2021-04-11 DIAGNOSIS — I10 Essential (primary) hypertension: Secondary | ICD-10-CM | POA: Diagnosis not present

## 2021-04-22 ENCOUNTER — Encounter: Payer: Self-pay | Admitting: Nurse Practitioner

## 2021-04-22 ENCOUNTER — Encounter: Payer: Self-pay | Admitting: Internal Medicine

## 2021-04-22 ENCOUNTER — Other Ambulatory Visit: Payer: Self-pay

## 2021-04-22 ENCOUNTER — Telehealth (INDEPENDENT_AMBULATORY_CARE_PROVIDER_SITE_OTHER): Payer: Medicare Other | Admitting: Nurse Practitioner

## 2021-04-22 VITALS — BP 123/74 | HR 65 | Temp 96.2°F | Ht 68.0 in | Wt 197.0 lb

## 2021-04-22 DIAGNOSIS — J4521 Mild intermittent asthma with (acute) exacerbation: Secondary | ICD-10-CM | POA: Diagnosis not present

## 2021-04-22 DIAGNOSIS — J0110 Acute frontal sinusitis, unspecified: Secondary | ICD-10-CM

## 2021-04-22 DIAGNOSIS — J0141 Acute recurrent pansinusitis: Secondary | ICD-10-CM | POA: Diagnosis not present

## 2021-04-22 MED ORDER — LEVOFLOXACIN 500 MG PO TABS: 500.0000 mg | ORAL_TABLET | Freq: Every day | ORAL | 0 refills | Status: DC

## 2021-04-22 MED ORDER — PREDNISONE 10 MG PO TABS: ORAL_TABLET | ORAL | 0 refills | Status: DC

## 2021-04-22 NOTE — Progress Notes (Signed)
[facility] [place], [place] [address]  Internal MEDICINE  Telephone Visit  Patient Name: James Mathews  [id number]  [id number]  Date of Service: 04/22/2021  I connected with the patient at 3:50 PM by telephone and verified the patients identity using two identifiers.   I discussed the limitations, risks, security and privacy concerns of performing an evaluation and management service by telephone and the availability of in person appointments. I also discussed with the patient that there may be a patient responsible charge related to the service.  The patient expressed understanding and agrees to proceed.    Chief Complaint  Patient presents with   Telephone Assessment    [id number] virtual    Telephone Screen    Upper resp infection, pt did not have access to a home covid test,  its been a week or better a little cough, less active then normal, headache.    HPI James Mathews presents for a telehealth virtual visit for upper respiratory infection. He has a history of recurrent sinus infections and asthma. He was not able to test at home for COVID but reports no known exposure. He reports nasal congestion, cough, runny nose, sinus pain/pressure, fatigue, headache. He denies fever, chills, body aches, sore throat, trouble swallowing, chest tightness, SOB, or wheezing. He reports that he usually gets a course of levofloxacin and prednisone. The last time he received a prescription for levofloxacin was in September 2021.    Current Medication: Outpatient Encounter Medications as of 04/22/2021  Medication Sig   levofloxacin (LEVAQUIN) 500 MG tablet Take 1 tablet (500 mg total) by mouth daily.   predniSONE (DELTASONE) 10 MG tablet Take one tab 3 x day for 3 days, then take one tab 2 x a day for 3 days and then take one tab a day for 3 days for copd   albuterol (VENTOLIN HFA) 108 (90 Base) MCG/ACT inhaler INHALE 2 PUFFS INTO THE LUNGS EVERY 6 HOURS AS NEEDED FOR  WHEEZING OR SHORTNESS OF BREATH   amLODipine (NORVASC) 10 MG tablet Take 10 mg by mouth daily.   aspirin 325 MG EC tablet Take 325 mg by mouth daily.   atorvastatin (LIPITOR) 20 MG tablet Take 20 mg by mouth daily.   benzonatate (TESSALON) 100 MG capsule Take 1 capsule (100 mg total) by mouth 2 (two) times daily as needed for cough. (Patient not taking: Reported on 03/23/2021)   Calcium-Magnesium-Vitamin D 400-166.7-133.3 MG-MG-UNIT TABS Take by mouth.   cholecalciferol (VITAMIN D3) 10 MCG (400 UNIT) TABS tablet Take 400 Units by mouth.   cyanocobalamin 1000 MCG tablet Take 1,000 mcg by mouth daily. (Patient not taking: Reported on 03/23/2021)   desloratadine (CLARINEX) 5 MG tablet Take 1 tablet (5 mg total) by mouth daily.   fluticasone (FLONASE) 50 MCG/ACT nasal spray SHAKE LIQUID AND USE 2 SPRAYS IN EACH NOSTRIL DAILY   montelukast (SINGULAIR) 10 MG tablet TAKE 1 TABLET BY MOUTH DAILY FOR CHRONIC ALLERGIC RHINITIS/ASTHMA   Multiple Vitamins-Minerals (MULTIVITAMIN ADULT PO) Take by mouth daily.   pantoprazole (PROTONIX) 40 MG tablet Take 1 tablet (40 mg total) by mouth daily.   potassium chloride (KLOR-CON) 10 MEQ tablet TAKE 1 TABLET BY MOUTH DAILY   vitamin C (ASCORBIC ACID) 500 MG tablet Take 500 mg by mouth daily.   No facility-administered encounter medications on file as of 04/22/2021.    Surgical History: Past Surgical History:  Procedure Laterality Date   cartaract surgery both eye Bilateral    CORONARY ARTERY  BYPASS GRAFT Bilateral    CYSTOSCOPY     HERNIA REPAIR     knee replacment Bilateral    retina surgery on right eye  Right 05/16/2017   TONSILLECTOMY Bilateral     Medical History: Past Medical History:  Diagnosis Date   Asthma    Atrial fibrillation ([place])    Coronary artery disease    Hyperlipidemia    Hypertension     Family History: Family History  Problem Relation Age of Onset   Diabetes Mother    Bowel Disease Father    Alcohol abuse Father      Social History   Socioeconomic History   Marital status: Married    Spouse name: Not on file   Number of children: Not on file   Years of education: Not on file   Highest education level: Not on file  Occupational History   Not on file  Tobacco Use   Smoking status: Former   Smokeless tobacco: Never  Substance and Sexual Activity   Alcohol use: No   Drug use: No   Sexual activity: Not on file  Other Topics Concern   Not on file  Social History Narrative   Not on file   Social Determinants of Health   Financial Resource Strain: Low Risk    Difficulty of Paying Living Expenses: Not very hard  Food Insecurity: Not on file  Transportation Needs: Not on file  Physical Activity: Not on file  Stress: Not on file  Social Connections: Not on file  Intimate Partner Violence: Not on file      Review of Systems  Constitutional:  Positive for fatigue. Negative for chills, diaphoresis and fever.  HENT:  Positive for congestion, postnasal drip, rhinorrhea, sinus pressure, sinus pain and sneezing. Negative for ear discharge, ear pain, hearing loss, sore throat and trouble swallowing.   Eyes: Negative.   Respiratory:  Positive for cough. Negative for chest tightness, shortness of breath and wheezing.   Cardiovascular: Negative.  Negative for chest pain and palpitations.  Gastrointestinal:  Negative for abdominal pain, constipation, diarrhea, nausea and vomiting.  Musculoskeletal: Negative.  Negative for myalgias.  Skin: Negative.  Negative for rash.  Neurological:  Positive for headaches. Negative for dizziness and light-headedness.  Psychiatric/Behavioral: Negative.     Vital Signs: BP 123/74   Pulse 65   Temp (!) 96.2 F (35.7 C)   Ht '5\' 8"'$  (1.727 m)   Wt 197 lb (89.4 kg)   BMI 29.95 kg/m    Observation/Objective: He is alert and oriented and engages in conversation appropriately. He does not sound like he is in any acute distress.    Assessment/Plan:   1.  Acute recurrent pansinusitis Antibiotic prescribed as requested. Follow up as needed if symptoms worsen or do not improve.  - levofloxacin (LEVAQUIN) 500 MG tablet; Take 1 tablet (500 mg total) by mouth daily.  Dispense: 7 tablet; Refill: 0  2. Mild intermittent asthma without complication Patient reports that upper respiratory infections including sinus infections are sometimes accompanied by symptoms of mild asthma exacerbation. Short course of prednisone prescribed to decrease inflammation and prevent any developing exacerbation. Patient understands that he should call 911 if he is having trouble breathing.  - predniSONE (DELTASONE) 10 MG tablet; Take one tab 3 x day for 3 days, then take one tab 2 x a day for 3 days and then take one tab a day for 3 days for copd  Dispense: 18 tablet; Refill: 0   General Counseling:  Ashely verbalizes understanding of the findings of today's phone visit and agrees with plan of treatment. I have discussed any further diagnostic evaluation that may be needed or ordered today. We also reviewed his medications today. he has been encouraged to call the office with any questions or concerns that should arise related to todays visit.  Return if symptoms worsen or fail to improve.   No orders of the defined types were placed in this encounter.   Meds ordered this encounter  Medications   levofloxacin (LEVAQUIN) 500 MG tablet    Sig: Take 1 tablet (500 mg total) by mouth daily.    Dispense:  7 tablet    Refill:  0   predniSONE (DELTASONE) 10 MG tablet    Sig: Take one tab 3 x day for 3 days, then take one tab 2 x a day for 3 days and then take one tab a day for 3 days for copd    Dispense:  18 tablet    Refill:  0    Time spent:15 Minutes Time spent with patient included reviewing progress notes, labs, imaging studies, and discussing plan for follow up.  [place] Controlled Substance Database was reviewed by me for overdose risk score (ORS) if  appropriate.  This patient was seen by Jonetta Osgood, FNP-C in collaboration with Dr. Clayborn Bigness as a part of collaborative care agreement.  Taliya Mcclard R. Valetta Fuller, MSN, FNP-C Internal medicine

## 2021-05-14 ENCOUNTER — Telehealth: Payer: Self-pay | Admitting: Pharmacist

## 2021-05-14 NOTE — Progress Notes (Addendum)
Chronic Care Management Pharmacy Assistant   Name: James Mathews  MRN: [id number] DOB: 1941-01-25  Reason for Encounter: Disease State For HTN.    Conditions to be addressed/monitored: HTN, CAD, Afib, Allergic rhinitis, GERD, HLD  Recent office visits:  04/22/21 Jonetta Osgood, NP. For sinusitis. STARTED Levofloxacin 500 mg daily, Prednisone 10 mg pack.   Recent consult visits:  None since 03/23/21  Hospital visits:  None since 03/23/21  Medications: Outpatient Encounter Medications as of 05/14/2021  Medication Sig   albuterol (VENTOLIN HFA) 108 (90 Base) MCG/ACT inhaler INHALE 2 PUFFS INTO THE LUNGS EVERY 6 HOURS AS NEEDED FOR WHEEZING OR SHORTNESS OF BREATH   amLODipine (NORVASC) 10 MG tablet Take 10 mg by mouth daily.   aspirin 325 MG EC tablet Take 325 mg by mouth daily.   atorvastatin (LIPITOR) 20 MG tablet Take 20 mg by mouth daily.   benzonatate (TESSALON) 100 MG capsule Take 1 capsule (100 mg total) by mouth 2 (two) times daily as needed for cough. (Patient not taking: Reported on 03/23/2021)   Calcium-Magnesium-Vitamin D 400-166.7-133.3 MG-MG-UNIT TABS Take by mouth.   cholecalciferol (VITAMIN D3) 10 MCG (400 UNIT) TABS tablet Take 400 Units by mouth.   cyanocobalamin 1000 MCG tablet Take 1,000 mcg by mouth daily. (Patient not taking: Reported on 03/23/2021)   desloratadine (CLARINEX) 5 MG tablet Take 1 tablet (5 mg total) by mouth daily.   fluticasone (FLONASE) 50 MCG/ACT nasal spray SHAKE LIQUID AND USE 2 SPRAYS IN EACH NOSTRIL DAILY   levofloxacin (LEVAQUIN) 500 MG tablet Take 1 tablet (500 mg total) by mouth daily.   montelukast (SINGULAIR) 10 MG tablet TAKE 1 TABLET BY MOUTH DAILY FOR CHRONIC ALLERGIC RHINITIS/ASTHMA   Multiple Vitamins-Minerals (MULTIVITAMIN ADULT PO) Take by mouth daily.   pantoprazole (PROTONIX) 40 MG tablet Take 1 tablet (40 mg total) by mouth daily.   potassium chloride (KLOR-CON) 10 MEQ tablet TAKE 1 TABLET BY MOUTH DAILY   predniSONE  (DELTASONE) 10 MG tablet Take one tab 3 x day for 3 days, then take one tab 2 x a day for 3 days and then take one tab a day for 3 days for copd   vitamin C (ASCORBIC ACID) 500 MG tablet Take 500 mg by mouth daily.   No facility-administered encounter medications on file as of 05/14/2021.   Reviewed chart prior to disease state call. Spoke with patient regarding BP  Recent Office Vitals: BP Readings from Last 3 Encounters:  04/22/21 123/74  03/01/21 122/68  12/23/20 113/73   Pulse Readings from Last 3 Encounters:  04/22/21 65  03/01/21 77  12/23/20 89    Wt Readings from Last 3 Encounters:  04/22/21 197 lb (89.4 kg)  03/01/21 197 lb (89.4 kg)  12/23/20 199 lb (90.3 kg)     Kidney Function Lab Results  Component Value Date/Time   CREATININE 0.96 03/02/2021 01:15 PM   CREATININE 1.02 02/18/2020 11:41 AM   GFRNONAA 69 02/18/2020 11:41 AM   GFRAA 80 02/18/2020 11:41 AM    BMP Latest Ref Rng & Units 03/02/2021 02/18/2020 10/15/2019  Glucose 65 - 99 mg/dL 154(H) 115(H) -  BUN 8 - 27 mg/dL 12 17 -  Creatinine 0.76 - 1.27 mg/dL 0.96 1.02 -  BUN/Creat Ratio 10 - '24 13 17 '$ -  Sodium 134 - 144 mmol/L 142 142 -  Potassium 3.5 - 5.2 mmol/L 4.0 4.0 3.2(L)  Chloride 96 - 106 mmol/L 106 105 -  CO2 20 - 29 mmol/L 23 24 -  Calcium 8.6 - 10.2 mg/dL 9.1 9.8 -    Current antihypertensive regimen:   How often are you checking your Blood Pressure?   Current home BP readings:   What recent interventions/DTPs have been made by any provider to improve Blood Pressure control since last CPP Visit: None.   Any recent hospitalizations or ED visits since last visit with CPP?  Chart does not show any ER visits.  What diet changes have been made to improve Blood Pressure Control?    What exercise is being done to improve your Blood Pressure Control?    Adherence Review: Is the patient currently on ACE/ARB medication? N/A.  Does the patient have >5 day gap between last estimated fill dates? Per  misc rpts, no.   Star Rating Drugs: Atorvastatin 20 mg 03/19/21 90 DS .   Third unsuccessful telephone outreach was attempted today. The patient was referred to the pharmacist for assistance with care management and care coordination.   Follow-Up:Pharmacist Review  Charlann Lange, [place] Pharmacist Assistant [phone]

## 2021-06-02 ENCOUNTER — Other Ambulatory Visit: Payer: Self-pay | Admitting: Internal Medicine

## 2021-06-02 DIAGNOSIS — K219 Gastro-esophageal reflux disease without esophagitis: Secondary | ICD-10-CM

## 2021-06-14 ENCOUNTER — Other Ambulatory Visit: Payer: Self-pay | Admitting: Nurse Practitioner

## 2021-06-14 DIAGNOSIS — J3089 Other allergic rhinitis: Secondary | ICD-10-CM

## 2021-06-17 DIAGNOSIS — Z23 Encounter for immunization: Secondary | ICD-10-CM | POA: Diagnosis not present

## 2021-06-23 ENCOUNTER — Encounter: Payer: Self-pay | Admitting: Internal Medicine

## 2021-06-24 ENCOUNTER — Telehealth (INDEPENDENT_AMBULATORY_CARE_PROVIDER_SITE_OTHER): Payer: Medicare Other | Admitting: Nurse Practitioner

## 2021-06-24 ENCOUNTER — Encounter: Payer: Self-pay | Admitting: Nurse Practitioner

## 2021-06-24 ENCOUNTER — Other Ambulatory Visit: Payer: Self-pay

## 2021-06-24 VITALS — BP 130/80 | Temp 97.1°F | Ht 68.0 in | Wt 190.0 lb

## 2021-06-24 DIAGNOSIS — R0602 Shortness of breath: Secondary | ICD-10-CM | POA: Diagnosis not present

## 2021-06-24 DIAGNOSIS — R051 Acute cough: Secondary | ICD-10-CM

## 2021-06-24 DIAGNOSIS — J0141 Acute recurrent pansinusitis: Secondary | ICD-10-CM

## 2021-06-24 MED ORDER — PREDNISONE 10 MG PO TABS: ORAL_TABLET | ORAL | 0 refills | Status: DC

## 2021-06-24 MED ORDER — DOXYCYCLINE HYCLATE 100 MG PO TABS: 100.0000 mg | ORAL_TABLET | Freq: Two times a day (BID) | ORAL | 0 refills | Status: DC

## 2021-06-24 MED ORDER — BENZONATATE 100 MG PO CAPS: 100.0000 mg | ORAL_CAPSULE | Freq: Two times a day (BID) | ORAL | 0 refills | Status: DC | PRN

## 2021-06-24 NOTE — Progress Notes (Signed)
[facility] [place], [place] [address]  Internal MEDICINE  Telephone Visit  Patient Name: James Mathews  [id number]  [id number]  Date of Service: 06/26/2021  I connected with the patient at 1:20 PM by telephone and verified the patients identity using two identifiers.   I discussed the limitations, risks, security and privacy concerns of performing an evaluation and management service by telephone and the availability of in person appointments. I also discussed with the patient that there may be a patient responsible charge related to the service.  The patient expressed understanding and agrees to proceed.    Chief Complaint  Patient presents with   Telephone Assessment    [phone]    Telephone Screen    Home covid test is negative   Sinusitis   Cough    HPI James Mathews presents for a telehealth virtual visit for symptoms of sinusitis. He also reports a cough. His home COVID test was negative. He was just treated for sinusitis in august 2022 with levofloxacin and prednisone taper.    Current Medication: Outpatient Encounter Medications as of 06/24/2021  Medication Sig   benzonatate (TESSALON) 100 MG capsule Take 1 capsule (100 mg total) by mouth 2 (two) times daily as needed for cough.   doxycycline (VIBRA-TABS) 100 MG tablet Take 1 tablet (100 mg total) by mouth 2 (two) times daily. With food   predniSONE (DELTASONE) 10 MG tablet Take one tab 3 x day for 3 days, then take one tab 2 x a day for 3 days and then take one tab a day for 3 days for copd   albuterol (VENTOLIN HFA) 108 (90 Base) MCG/ACT inhaler INHALE 2 PUFFS INTO THE LUNGS EVERY 6 HOURS AS NEEDED FOR WHEEZING OR SHORTNESS OF BREATH   amLODipine (NORVASC) 10 MG tablet Take 10 mg by mouth daily.   aspirin 325 MG EC tablet Take 325 mg by mouth daily.   atorvastatin (LIPITOR) 20 MG tablet Take 20 mg by mouth daily.   Calcium-Magnesium-Vitamin D 400-166.7-133.3 MG-MG-UNIT TABS Take by mouth.    cholecalciferol (VITAMIN D3) 10 MCG (400 UNIT) TABS tablet Take 400 Units by mouth.   cyanocobalamin 1000 MCG tablet Take 1,000 mcg by mouth daily. (Patient not taking: Reported on 03/23/2021)   desloratadine (CLARINEX) 5 MG tablet Take 1 tablet (5 mg total) by mouth daily.   fluticasone (FLONASE) 50 MCG/ACT nasal spray SHAKE LIQUID AND USE 2 SPRAYS IN EACH NOSTRIL DAILY   montelukast (SINGULAIR) 10 MG tablet TAKE 1 TABLET BY MOUTH DAILY FOR CHRONIC ALLERGIC RHINITIS/ASTHMA   Multiple Vitamins-Minerals (MULTIVITAMIN ADULT PO) Take by mouth daily.   pantoprazole (PROTONIX) 40 MG tablet TAKE 1 TABLET BY MOUTH DAILY   potassium chloride (KLOR-CON) 10 MEQ tablet TAKE 1 TABLET BY MOUTH DAILY   vitamin C (ASCORBIC ACID) 500 MG tablet Take 500 mg by mouth daily.   [DISCONTINUED] benzonatate (TESSALON) 100 MG capsule Take 1 capsule (100 mg total) by mouth 2 (two) times daily as needed for cough. (Patient not taking: Reported on 03/23/2021)   [DISCONTINUED] levofloxacin (LEVAQUIN) 500 MG tablet Take 1 tablet (500 mg total) by mouth daily.   [DISCONTINUED] predniSONE (DELTASONE) 10 MG tablet Take one tab 3 x day for 3 days, then take one tab 2 x a day for 3 days and then take one tab a day for 3 days for copd   No facility-administered encounter medications on file as of 06/24/2021.    Surgical History: Past Surgical History:  Procedure  Laterality Date   cartaract surgery both eye Bilateral    CORONARY ARTERY BYPASS GRAFT Bilateral    CYSTOSCOPY     HERNIA REPAIR     knee replacment Bilateral    retina surgery on right eye  Right 05/16/2017   TONSILLECTOMY Bilateral     Medical History: Past Medical History:  Diagnosis Date   Asthma    Atrial fibrillation ([place])    Coronary artery disease    Hyperlipidemia    Hypertension     Family History: Family History  Problem Relation Age of Onset   Diabetes Mother    Bowel Disease Father    Alcohol abuse Father     Social History    Socioeconomic History   Marital status: Married    Spouse name: Not on file   Number of children: Not on file   Years of education: Not on file   Highest education level: Not on file  Occupational History   Not on file  Tobacco Use   Smoking status: Former   Smokeless tobacco: Never  Substance and Sexual Activity   Alcohol use: No   Drug use: No   Sexual activity: Not on file  Other Topics Concern   Not on file  Social History Narrative   Not on file   Social Determinants of Health   Financial Resource Strain: Low Risk    Difficulty of Paying Living Expenses: Not very hard  Food Insecurity: Not on file  Transportation Needs: Not on file  Physical Activity: Not on file  Stress: Not on file  Social Connections: Not on file  Intimate Partner Violence: Not on file      Review of Systems  Constitutional:  Positive for fatigue. Negative for chills, diaphoresis and fever.  HENT:  Positive for congestion, postnasal drip, rhinorrhea, sinus pressure, sinus pain and sneezing. Negative for ear discharge, ear pain, hearing loss, sore throat and trouble swallowing.   Eyes: Negative.   Respiratory:  Positive for cough and shortness of breath. Negative for chest tightness and wheezing.   Cardiovascular: Negative.  Negative for chest pain and palpitations.  Gastrointestinal:  Negative for abdominal pain, constipation, diarrhea, nausea and vomiting.  Musculoskeletal: Negative.  Negative for myalgias.  Skin: Negative.  Negative for rash.  Neurological:  Positive for headaches. Negative for dizziness and light-headedness.  Psychiatric/Behavioral: Negative.     Vital Signs: BP 130/80   Temp (!) 97.1 F (36.2 C)   Ht 5\' 8"  (1.727 m)   Wt 190 lb (86.2 kg)   BMI 28.89 kg/m    Observation/Objective: He is alert and oriented and engages in conversation appropriately. He does not seem to be in any acute distress over telephone call.     Assessment/Plan: 1. Acute recurrent  pansinusitis Empiric antibiotic treatment with doxycycline, has had levofloxacin within the past 3 months. Cannot tolerate augmentin. - doxycycline (VIBRA-TABS) 100 MG tablet; Take 1 tablet (100 mg total) by mouth 2 (two) times daily. With food  Dispense: 20 tablet; Refill: 0  2. Acute cough Symptomatic treatment for cough prescribed - benzonatate (TESSALON) 100 MG capsule; Take 1 capsule (100 mg total) by mouth 2 (two) times daily as needed for cough.  Dispense: 30 capsule; Refill: 0  3. SOB (shortness of breath) Prednisone taper prescribed.  - predniSONE (DELTASONE) 10 MG tablet; Take one tab 3 x day for 3 days, then take one tab 2 x a day for 3 days and then take one tab a day for 3 days  for copd  Dispense: 18 tablet; Refill: 0   General Counseling: rhyatt muska understanding of the findings of today's phone visit and agrees with plan of treatment. I have discussed any further diagnostic evaluation that may be needed or ordered today. We also reviewed his medications today. he has been encouraged to call the office with any questions or concerns that should arise related to todays visit.  Return if symptoms worsen or fail to improve.   No orders of the defined types were placed in this encounter.   Meds ordered this encounter  Medications   predniSONE (DELTASONE) 10 MG tablet    Sig: Take one tab 3 x day for 3 days, then take one tab 2 x a day for 3 days and then take one tab a day for 3 days for copd    Dispense:  18 tablet    Refill:  0   doxycycline (VIBRA-TABS) 100 MG tablet    Sig: Take 1 tablet (100 mg total) by mouth 2 (two) times daily. With food    Dispense:  20 tablet    Refill:  0   benzonatate (TESSALON) 100 MG capsule    Sig: Take 1 capsule (100 mg total) by mouth 2 (two) times daily as needed for cough.    Dispense:  30 capsule    Refill:  0    Time spent:10 Minutes Time spent with patient included reviewing progress notes, labs, imaging studies, and  discussing plan for follow up.  [place] Controlled Substance Database was reviewed by me for overdose risk score (ORS) if appropriate.  This patient was seen by Jonetta Osgood, FNP-C in collaboration with Dr. Clayborn Bigness as a part of collaborative care agreement.  Jedrick Hutcherson R. Valetta Fuller, MSN, FNP-C Internal medicine

## 2021-07-07 DIAGNOSIS — I351 Nonrheumatic aortic (valve) insufficiency: Secondary | ICD-10-CM | POA: Diagnosis not present

## 2021-07-07 DIAGNOSIS — I4819 Other persistent atrial fibrillation: Secondary | ICD-10-CM | POA: Diagnosis not present

## 2021-07-07 DIAGNOSIS — I25708 Atherosclerosis of coronary artery bypass graft(s), unspecified, with other forms of angina pectoris: Secondary | ICD-10-CM | POA: Diagnosis not present

## 2021-07-07 DIAGNOSIS — E782 Mixed hyperlipidemia: Secondary | ICD-10-CM | POA: Diagnosis not present

## 2021-07-07 DIAGNOSIS — I6523 Occlusion and stenosis of bilateral carotid arteries: Secondary | ICD-10-CM | POA: Diagnosis not present

## 2021-07-07 DIAGNOSIS — I10 Essential (primary) hypertension: Secondary | ICD-10-CM | POA: Diagnosis not present

## 2021-07-07 DIAGNOSIS — I739 Peripheral vascular disease, unspecified: Secondary | ICD-10-CM | POA: Diagnosis not present

## 2021-07-07 DIAGNOSIS — I712 Thoracic aortic aneurysm, without rupture, unspecified: Secondary | ICD-10-CM | POA: Diagnosis not present

## 2021-07-26 DIAGNOSIS — I7143 Infrarenal abdominal aortic aneurysm, without rupture: Secondary | ICD-10-CM | POA: Diagnosis not present

## 2021-07-26 DIAGNOSIS — Z951 Presence of aortocoronary bypass graft: Secondary | ICD-10-CM | POA: Diagnosis not present

## 2021-07-26 DIAGNOSIS — I728 Aneurysm of other specified arteries: Secondary | ICD-10-CM | POA: Diagnosis not present

## 2021-07-26 DIAGNOSIS — Z87891 Personal history of nicotine dependence: Secondary | ICD-10-CM | POA: Diagnosis not present

## 2021-07-26 DIAGNOSIS — I7100 Dissection of unspecified site of aorta: Secondary | ICD-10-CM | POA: Diagnosis not present

## 2021-07-26 DIAGNOSIS — I712 Thoracic aortic aneurysm, without rupture, unspecified: Secondary | ICD-10-CM | POA: Diagnosis not present

## 2021-07-26 DIAGNOSIS — I351 Nonrheumatic aortic (valve) insufficiency: Secondary | ICD-10-CM | POA: Diagnosis not present

## 2021-07-26 DIAGNOSIS — I2510 Atherosclerotic heart disease of native coronary artery without angina pectoris: Secondary | ICD-10-CM | POA: Diagnosis not present

## 2021-07-26 DIAGNOSIS — I513 Intracardiac thrombosis, not elsewhere classified: Secondary | ICD-10-CM | POA: Diagnosis not present

## 2021-07-26 DIAGNOSIS — I77810 Thoracic aortic ectasia: Secondary | ICD-10-CM | POA: Diagnosis not present

## 2021-07-26 DIAGNOSIS — I77811 Abdominal aortic ectasia: Secondary | ICD-10-CM | POA: Diagnosis not present

## 2021-07-26 NOTE — Progress Notes (Signed)
Chronic Care Management Pharmacy Note  07/28/2021 Name:  James Mathews MRN:  [id number] DOB:  April 20, 1941  Summary: Patient reports having to use his albuterol several times a day while being active. Would like to be started on a maintenance inhaler if possible and affordable. BP stable and running well at home.  Recommendations/Changes made from today's visit: Continue all medications and continue to limit salt intake. Pharmacist will look into maintenance inhalers and consult PCP  Plan: F/U with pharmacist in 4 months   Subjective: James Mathews is an 80 y.o. year old male who is a primary patient of Humphrey Rolls, Timoteo Gaul, MD.  The CCM team was consulted for assistance with disease management and care coordination needs.    Engaged with patient by telephone for follow up visit in response to provider referral for pharmacy case management and/or care coordination services.   Consent to Services:  The patient was given the following information about Chronic Care Management services today, agreed to services, and gave verbal consent: 1. CCM service includes personalized support from designated clinical staff supervised by the primary care provider, including individualized plan of care and coordination with other care providers 2. 24/7 contact phone numbers for assistance for urgent and routine care needs. 3. Service will only be billed when office clinical staff spend 20 minutes or more in a month to coordinate care. 4. Only one practitioner may furnish and bill the service in a calendar month. 5.The patient may stop CCM services at any time (effective at the end of the month) by phone call to the office staff. 6. The patient will be responsible for cost sharing (co-pay) of up to 20% of the service fee (after annual deductible is met). Patient agreed to services and consent obtained.  Patient Care Team: Lavera Guise, MD as PCP - General (Internal Medicine) Alena Bills, [facility] as  Pharmacist (Pharmacist)  Recent office visits:  04/22/21 Jonetta Osgood, NP. For sinusitis. STARTED Levofloxacin 500 mg daily, Prednisone 10 mg pack.   Recent consult visits:  07/07/21: Cardiology Flossie Dibble. For follow-up. No changes made.    Hospital visits:  None in previous 6 months   Medication History: Atorvastatin 20 mg 90 DS 06/14/21   Objective:  Lab Results  Component Value Date   CREATININE 0.96 03/02/2021   BUN 12 03/02/2021   GFRNONAA 69 02/18/2020   GFRAA 80 02/18/2020   NA 142 03/02/2021   K 4.0 03/02/2021   CALCIUM 9.1 03/02/2021   CO2 23 03/02/2021   GLUCOSE 154 (H) 03/02/2021    No results found for: HGBA1C, FRUCTOSAMINE, GFR, MICROALBUR  Last diabetic Eye exam: No results found for: HMDIABEYEEXA  Last diabetic Foot exam: No results found for: HMDIABFOOTEX   Lab Results  Component Value Date   CHOL 107 03/02/2021   HDL 37 (L) 03/02/2021   LDLCALC 50 03/02/2021   TRIG 104 03/02/2021    Hepatic Function Latest Ref Rng & Units 03/02/2021 02/18/2020 02/15/2019  Total Protein 6.0 - 8.5 g/dL 6.5 6.7 6.5  Albumin 3.6 - 4.6 g/dL 4.6 4.7 4.5  AST 0 - 40 IU/L 25 23 17   ALT 0 - 44 IU/L 19 16 13   Alk Phosphatase 44 - 121 IU/L 72 79 59  Total Bilirubin 0.0 - 1.2 mg/dL 0.6 0.5 0.5    Lab Results  Component Value Date/Time   TSH 3.030 02/18/2020 11:41 AM   TSH 3.030 07/01/2019 01:58 PM   FREET4 1.09 02/18/2020 11:41 AM  FREET4 1.14 07/01/2019 01:58 PM    CBC Latest Ref Rng & Units 02/18/2020 02/15/2019 07/03/2018  WBC 3.4 - 10.8 x10E3/uL 8.1 6.6 7.0  Hemoglobin 13.0 - 17.7 g/dL 14.2 13.7 12.5(L)  Hematocrit 37.5 - 51.0 % 43.4 39.8 37.9(L)  Platelets 150 - 450 x10E3/uL 226 236 156    Lab Results  Component Value Date/Time   VD25OH 44.6 02/18/2020 11:41 AM    Clinical ASCVD: Yes  The ASCVD Risk score (Arnett DK, et al., 2019) failed to calculate for the following reasons:   The 2019 ASCVD risk score is only valid for ages 58 to 66     Depression screen PHQ 2/9 03/01/2021 12/23/2020 11/10/2020  Decreased Interest 0 0 0  Down, Depressed, Hopeless 0 0 0  PHQ - 2 Score 0 0 0      Social History   Tobacco Use  Smoking Status Former  Smokeless Tobacco Never   BP Readings from Last 3 Encounters:  06/24/21 130/80  04/22/21 123/74  03/01/21 122/68   Pulse Readings from Last 3 Encounters:  04/22/21 65  03/01/21 77  12/23/20 89   Wt Readings from Last 3 Encounters:  06/24/21 190 lb (86.2 kg)  04/22/21 197 lb (89.4 kg)  03/01/21 197 lb (89.4 kg)   BMI Readings from Last 3 Encounters:  06/24/21 28.89 kg/m  04/22/21 29.95 kg/m  03/01/21 29.95 kg/m    Assessment/Interventions: Review of patient past medical history, allergies, medications, health status, including review of consultants reports, laboratory and other test data, was performed as part of comprehensive evaluation and provision of chronic care management services.   SDOH:  (Social Determinants of Health) assessments and interventions performed: Yes  Financial Resource Strain: Low Risk    Difficulty of Paying Living Expenses: Not very hard    SDOH Screenings   Alcohol Screen: Not on file  Depression (PHQ2-9): Low Risk    PHQ-2 Score: 0  Financial Resource Strain: Low Risk    Difficulty of Paying Living Expenses: Not very hard  Food Insecurity: Not on file  Housing: Not on file  Physical Activity: Not on file  Social Connections: Not on file  Stress: Not on file  Tobacco Use: Medium Risk   Smoking Tobacco Use: Former   Smokeless Tobacco Use: Never   Passive Exposure: Not on file  Transportation Needs: Not on file    [place]  No Known Allergies  Medications Reviewed Today     Reviewed by Alena Bills, [facility] (Pharmacist) on 07/28/21 at 1246  Med List Status: <None>   Medication Order Taking? Sig Documenting Provider Last Dose Status Informant  albuterol (VENTOLIN HFA) 108 (90 Base) MCG/ACT inhaler [id number] No INHALE 2  PUFFS INTO THE LUNGS EVERY 6 HOURS AS NEEDED FOR WHEEZING OR SHORTNESS OF BREATH Lavera Guise, MD Taking Active   amLODipine (NORVASC) 10 MG tablet [id number] No Take 10 mg by mouth daily. [provider] Taking Active   aspirin 325 MG EC tablet [id number] No Take 325 mg by mouth daily. [provider] Taking Active   atorvastatin (LIPITOR) 20 MG tablet [id number] No Take 20 mg by mouth daily. [provider] Taking Active   benzonatate (TESSALON) 100 MG capsule [id number]  Take 1 capsule (100 mg total) by mouth 2 (two) times daily as needed for cough. Jonetta Osgood, NP  Active   Calcium-Magnesium-Vitamin D 400-166.7-133.3 MG-MG-UNIT TABS [id number] No Take by mouth. [provider] Taking Active   cholecalciferol (VITAMIN D3) 10 MCG (400 UNIT)  TABS tablet [id number] No Take 400 Units by mouth. [provider] Taking Active   cyanocobalamin 1000 MCG tablet [id number] No Take 1,000 mcg by mouth daily.  Patient not taking: Reported on 03/23/2021   [provider] Not Taking Active   desloratadine (CLARINEX) 5 MG tablet [id number]  Take 1 tablet (5 mg total) by mouth daily. Lavera Guise, MD  Active   doxycycline (VIBRA-TABS) 100 MG tablet [id number]  Take 1 tablet (100 mg total) by mouth 2 (two) times daily. With food Jonetta Osgood, NP  Active   fluticasone (FLONASE) 50 MCG/ACT nasal spray [id number] No SHAKE LIQUID AND USE 2 SPRAYS IN [facility] NOSTRIL DAILY Lavera Guise, MD Taking Active   montelukast (SINGULAIR) 10 MG tablet [id number] No TAKE 1 TABLET BY MOUTH DAILY FOR CHRONIC ALLERGIC RHINITIS/ASTHMA Boscia, Heather E, NP Taking Active   Multiple Vitamins-Minerals (MULTIVITAMIN ADULT PO) [id number] No Take by mouth daily. [provider] Taking Active   pantoprazole (PROTONIX) 40 MG tablet [id number]  TAKE 1 TABLET BY MOUTH DAILY Lavera Guise, MD  Active   potassium chloride (KLOR-CON) 10 MEQ tablet [id number] No TAKE 1 TABLET BY MOUTH DAILY  Lavera Guise, MD Taking Active   predniSONE (DELTASONE) 10 MG tablet [id number]  Take one tab 3 x day for 3 days, then take one tab 2 x a day for 3 days and then take one tab a day for 3 days for copd Jonetta Osgood, NP  Active   vitamin C (ASCORBIC ACID) 500 MG tablet [id number] No Take 500 mg by mouth daily. [provider] Taking Active             Patient Active Problem List   Diagnosis Date Noted   Esophageal dysphagia 07/31/2020   Paroxysmal atrial fibrillation (HCC) 07/19/2020   Hypokalemia 07/24/2019   Acute non-recurrent frontal sinusitis 12/10/2018   Leg swelling 12/10/2018   Recurrent sinus infections 04/22/2018   Vitamin B12 deficiency 03/18/2018   Fatigue 02/26/2018   Dizziness 02/26/2018   Cough 01/02/2018   Shortness of breath 01/02/2018   Acute recurrent pansinusitis 11/23/2017   Gastroesophageal reflux disease without esophagitis 11/23/2017   Allergic rhinitis 11/23/2017   Mild intermittent asthma with acute exacerbation 11/23/2017   CAD (coronary artery disease) 09/28/2017   Hyperlipidemia, mixed 09/28/2017   Bilateral carotid artery stenosis 06/25/2015   Essential hypertension 06/17/2015   Peripheral vascular disease ([place]) 05/27/2014    Immunization History  Administered Date(s) Administered   Influenza Inj Mdck Quad Pf 06/09/2020   Influenza, High Dose Seasonal PF 07/02/2018   Influenza-Unspecified 06/19/2017   PFIZER(Purple Top)SARS-COV-2 Vaccination 09/26/2019, 10/17/2019, 06/11/2020   Pneumococcal Conjugate-13 08/17/2017   Pneumococcal Polysaccharide-23 05/18/2015, 03/26/2020   Tetanus 06/19/2015   Zoster, Live 06/12/2015    Conditions to be addressed/monitored:  HTN, CAD, Afib, Allergic rhinitis, GERD, HLD,   Care Plan : [place]  Updates made by Alena Bills, [place] since 07/28/2021 12:00 AM     Problem: HTN, CAD, Afib, Allergic rhinitis, GERD, HLD,   Priority: High  Onset Date: 03/23/2021     Long-Range  Goal: Disease Management   Start Date: 03/23/2021  Expected End Date: 07/28/2022  Recent Progress: On track  Priority: High  Note:   Current Barriers:  None noted at this time.  Pharmacist Clinical Goal(s):  Patient will verbalize ability to afford treatment regimen through collaboration with PharmD and provider.   Interventions: 1:1 collaboration with Lavera Guise, MD regarding development and update of comprehensive  plan of care as evidenced by provider attestation and co-signature Inter-disciplinary care team collaboration (see longitudinal plan of care) Comprehensive medication review performed; medication list updated in electronic medical record  Hypertension (BP goal <130/80) -Controlled -Current treatment: Amlodipine 57m daily -Medications previously tried: None noted  -Current home readings: 130/80 -Current dietary habits: limits salt intake -Current exercise habits: yardwork or walks on track for about a mile -Denies hypotensive/hypertensive symptoms -Educated on Daily salt intake goal < 2300 mg; Exercise goal of 150 minutes per week; Importance of home blood pressure monitoring; -Counseled to monitor BP at home 1-2 times per week, document, and provide log at future appointments -Recommended to continue current medication  COPD (Goal: control symptoms and prevent exacerbations) -Not ideally controlled -Current treatment  Albuterol HFA as needed -Medications previously tried: spiriva per patient report  -Exacerbations requiring treatment in last 6 months: 0 -Patient requests that he be considered for consistent use of maintenance inhaler -Frequency of rescue inhaler use: several times a day while being active -Counseled on Benefits of consistent maintenance inhaler use -Discussed looking into most affordable maintenance inhaler and sending that referal info to PCP -Recommended to continue current medication Assessed patient finances. Will see if patient qualifies  for PAP for any considered maintenance inhaler  Patient Goals/Self-Care Activities Patient will:  - take medications as prescribed as evidenced by patient report and record review check blood pressure 1-2 times per week, document, and provide at future appointments target a minimum of 150 minutes of moderate intensity exercise weekly  Follow Up Plan: Telephone follow up appointment with care management team member scheduled for: 4 months   APutneyPharmacist [phone]      Medication Assistance: Patient requests possible addition of maintenance inhaler for breathing. May need PAP if this is done.  Compliance/Adherence/Medication fill history: Care Gaps: Zoster   Star-Rating Drugs: Atorvastatin 20 mg 90 DS 03/19/21  [facility] [id number]: [phone]: [phone] Uses pill box? Yes Pt endorses 100% compliance  We discussed: Benefits of medication synchronization, packaging and delivery as well as enhanced pharmacist oversight with [employer]. Patient decided to: Continue current medication management strategy  Care Plan and Follow Up Patient Decision:  Patient agrees to Care Plan and Follow-up.  Plan: The care management team will reach out to the patient again over the next 120 days.  AAlena BillsClinical Pharmacist [phone]

## 2021-07-28 ENCOUNTER — Other Ambulatory Visit: Payer: Self-pay

## 2021-07-28 ENCOUNTER — Ambulatory Visit: Payer: Medicare Other | Admitting: Student-PharmD

## 2021-07-28 DIAGNOSIS — I10 Essential (primary) hypertension: Secondary | ICD-10-CM

## 2021-07-28 DIAGNOSIS — R0602 Shortness of breath: Secondary | ICD-10-CM

## 2021-07-28 NOTE — Patient Instructions (Addendum)
Visit Information   Goals Addressed             This Visit's Progress    Track and Manage My Blood Pressure-Hypertension   On track    Timeframe:  Long-Range Goal Priority:  High Start Date:    03/23/2021                         Expected End Date: 09/23/20                      Follow Up Date 06/11/21    - check blood pressure 3 times per week - choose a place to take my blood pressure (home, clinic or office, retail store) - write blood pressure results in a log or diary    Why is this important?   You won't feel high blood pressure, but it can still hurt your blood vessels.  High blood pressure can cause heart or kidney problems. It can also cause a stroke.  Making lifestyle changes like losing a little weight or eating less salt will help.  Checking your blood pressure at home and at different times of the day can help to control blood pressure.  If the doctor prescribes medicine remember to take it the way the doctor ordered.  Call the office if you cannot afford the medicine or if there are questions about it.     Notes:      Track and Manage My Symptoms-Asthma   On track    Timeframe:  Long-Range Goal Priority:  High Start Date:      07/28/2021                       Expected End Date:       07/28/2022                Follow Up Date 11/2021    - begin a symptom diary - develop an asthma action plan - keep follow-up appointments    Why is this important?   Keeping track of asthma symptoms can tell you a lot about your asthma control.  Based on symptoms and peak flow results you can see how well you are doing.  Your asthma action plan has a green, yellow and red zone. Green means all is good; it is your goal. Yellow means your symptoms are a little worse. You will need to adjust your medications. Being in the red zone means that your   asthma is out of control. You will need to use your rescue medicines. You may need emergency care.     Notes:        Patient Care  Plan: CCM Pharmacy Care [place]     Problem Identified: HTN, CAD, Afib, Allergic rhinitis, GERD, HLD,   Priority: High  Onset Date: 03/23/2021     Long-Range Goal: Disease Management   Start Date: 03/23/2021  Expected End Date: 07/28/2022  Recent Progress: On track  Priority: High  Note:   Current Barriers:  None noted at this time.  Pharmacist Clinical Goal(s):  Patient will verbalize ability to afford treatment regimen through collaboration with PharmD and provider.   Interventions: 1:1 collaboration with Lavera Guise, MD regarding development and update of comprehensive plan of care as evidenced by provider attestation and co-signature Inter-disciplinary care team collaboration (see longitudinal plan of care) Comprehensive medication review performed; medication list updated in electronic medical record  Hypertension (  BP goal <130/80) -Controlled -Current treatment: Amlodipine 10mg  daily -Medications previously tried: None noted  -Current home readings: 130/80 -Current dietary habits: limits salt intake -Current exercise habits: yardwork or walks on track for about a mile -Denies hypotensive/hypertensive symptoms -Educated on Daily salt intake goal < 2300 mg; Exercise goal of 150 minutes per week; Importance of home blood pressure monitoring; -Counseled to monitor BP at home 1-2 times per week, document, and provide log at future appointments -Recommended to continue current medication  COPD (Goal: control symptoms and prevent exacerbations) -Not ideally controlled -Current treatment  Albuterol HFA as needed -Medications previously tried: spiriva per patient report  -Exacerbations requiring treatment in last 6 months: 0 -Patient requests that he be considered for consistent use of maintenance inhaler -Frequency of rescue inhaler use: several times a day while being active -Counseled on Benefits of consistent maintenance inhaler use -Discussed looking into most  affordable maintenance inhaler and sending that referal info to PCP -Recommended to continue current medication Assessed patient finances. Will see if patient qualifies for PAP for any considered maintenance inhaler  Patient Goals/Self-Care Activities Patient will:  - take medications as prescribed as evidenced by patient report and record review check blood pressure 1-2 times per week, document, and provide at future appointments target a minimum of 150 minutes of moderate intensity exercise weekly  Follow Up Plan: Telephone follow up appointment with care management team member scheduled for: 4 months   [place] Pharmacist [phone]      Patient verbalizes understanding of instructions provided today and agrees to view in [place].  Telephone follow up appointment with pharmacy team member scheduled for: 4 months  Alena Bills, [facility]  Clinical Pharmacist [phone]

## 2021-07-29 ENCOUNTER — Other Ambulatory Visit: Payer: Self-pay | Admitting: Nurse Practitioner

## 2021-07-29 DIAGNOSIS — J3089 Other allergic rhinitis: Secondary | ICD-10-CM

## 2021-08-04 ENCOUNTER — Other Ambulatory Visit: Payer: Self-pay

## 2021-08-04 DIAGNOSIS — J3089 Other allergic rhinitis: Secondary | ICD-10-CM

## 2021-08-04 MED ORDER — MONTELUKAST SODIUM 10 MG PO TABS: ORAL_TABLET | ORAL | 3 refills | Status: DC

## 2021-08-10 ENCOUNTER — Other Ambulatory Visit: Payer: Self-pay

## 2021-08-10 ENCOUNTER — Telehealth: Payer: Self-pay

## 2021-08-10 ENCOUNTER — Encounter: Payer: Self-pay | Admitting: Nurse Practitioner

## 2021-08-10 MED ORDER — AMOXICILLIN-POT CLAVULANATE 875-125 MG PO TABS: 1.0000 | ORAL_TABLET | Freq: Two times a day (BID) | ORAL | 0 refills | Status: DC

## 2021-08-10 NOTE — Telephone Encounter (Signed)
Pt send my chart message that he is having sinus infection Covid test is negative no body aches offer him appt he like [place] to call in antibiotic as per dr Humphrey Rolls send Augmentin [phone] for 10 days and also advised pt that if he is not feeling better need appt in person

## 2021-08-11 DIAGNOSIS — I10 Essential (primary) hypertension: Secondary | ICD-10-CM

## 2021-08-11 DIAGNOSIS — E782 Mixed hyperlipidemia: Secondary | ICD-10-CM

## 2021-08-11 DIAGNOSIS — K219 Gastro-esophageal reflux disease without esophagitis: Secondary | ICD-10-CM

## 2021-08-12 ENCOUNTER — Other Ambulatory Visit: Payer: Self-pay | Admitting: Internal Medicine

## 2021-08-12 DIAGNOSIS — E876 Hypokalemia: Secondary | ICD-10-CM

## 2021-08-20 DIAGNOSIS — I4811 Longstanding persistent atrial fibrillation: Secondary | ICD-10-CM | POA: Diagnosis not present

## 2021-08-20 DIAGNOSIS — I4819 Other persistent atrial fibrillation: Secondary | ICD-10-CM | POA: Diagnosis not present

## 2021-08-25 ENCOUNTER — Encounter: Payer: Self-pay | Admitting: Nurse Practitioner

## 2021-08-31 ENCOUNTER — Other Ambulatory Visit: Payer: Self-pay

## 2021-08-31 ENCOUNTER — Encounter: Payer: Self-pay | Admitting: Nurse Practitioner

## 2021-08-31 ENCOUNTER — Ambulatory Visit (INDEPENDENT_AMBULATORY_CARE_PROVIDER_SITE_OTHER): Payer: Medicare Other | Admitting: Nurse Practitioner

## 2021-08-31 VITALS — BP 112/71 | HR 62 | Temp 98.5°F | Resp 16 | Ht 68.0 in | Wt 200.0 lb

## 2021-08-31 DIAGNOSIS — J0141 Acute recurrent pansinusitis: Secondary | ICD-10-CM

## 2021-08-31 DIAGNOSIS — R051 Acute cough: Secondary | ICD-10-CM | POA: Diagnosis not present

## 2021-08-31 MED ORDER — PREDNISONE 10 MG PO TABS: ORAL_TABLET | ORAL | 0 refills | Status: DC

## 2021-08-31 MED ORDER — LEVOFLOXACIN 750 MG PO TABS: 750.0000 mg | ORAL_TABLET | Freq: Every day | ORAL | 0 refills | Status: AC

## 2021-08-31 NOTE — Progress Notes (Signed)
[facility] [place], [place] [address]  Internal MEDICINE  Office Visit Note  Patient Name: James Mathews  [id number]  [id number]  Date of Service: 08/31/2021  Chief Complaint  Patient presents with   Follow-up   Hyperlipidemia   Hypertension   Asthma   Medication Refill   Cough   Sinusitis   Fatigue   HPI James Mathews presents for a follow up visit for recurrent sinusitis and persistent cough. He has a history of asthma. He does not need any medication refills at this time. He was treated for sinusitis in October and august this year. He reports having cough, chest tightness, SOB, wheezing, runny nose, sinus pressure, fatigue, and sore throat. He denies and fever, chills or body aches.     Current Medication: Outpatient Encounter Medications as of 08/31/2021  Medication Sig   albuterol (VENTOLIN HFA) 108 (90 Base) MCG/ACT inhaler INHALE 2 PUFFS INTO THE LUNGS EVERY 6 HOURS AS NEEDED FOR WHEEZING OR SHORTNESS OF BREATH   amLODipine (NORVASC) 10 MG tablet Take 10 mg by mouth daily.   aspirin 325 MG EC tablet Take 325 mg by mouth daily.   atorvastatin (LIPITOR) 20 MG tablet Take 20 mg by mouth daily.   Calcium-Magnesium-Vitamin D 400-166.7-133.3 MG-MG-UNIT TABS Take by mouth.   cholecalciferol (VITAMIN D3) 10 MCG (400 UNIT) TABS tablet Take 400 Units by mouth.   cyanocobalamin 1000 MCG tablet Take 1,000 mcg by mouth daily.   desloratadine (CLARINEX) 5 MG tablet Take 1 tablet (5 mg total) by mouth daily.   fluticasone (FLONASE) 50 MCG/ACT nasal spray SHAKE LIQUID AND USE 2 SPRAYS IN EACH NOSTRIL DAILY   levofloxacin (LEVAQUIN) 750 MG tablet Take 1 tablet (750 mg total) by mouth daily for 7 days.   montelukast (SINGULAIR) 10 MG tablet TAKE 1 TABLET BY MOUTH DAILY FOR CHRONIC ALLERGIC RHINITIS/ASTHMA   Multiple Vitamins-Minerals (MULTIVITAMIN ADULT PO) Take by mouth daily.   pantoprazole (PROTONIX) 40 MG tablet TAKE 1 TABLET BY MOUTH DAILY   potassium  chloride (KLOR-CON) 10 MEQ tablet TAKE 1 TABLET BY MOUTH DAILY   predniSONE (DELTASONE) 10 MG tablet Take one tab 3 x day for 3 days, then take one tab 2 x a day for 3 days and then take one tab a day for 3 days for copd   vitamin C (ASCORBIC ACID) 500 MG tablet Take 500 mg by mouth daily.   [DISCONTINUED] benzonatate (TESSALON) 100 MG capsule Take 1 capsule (100 mg total) by mouth 2 (two) times daily as needed for cough.   [DISCONTINUED] doxycycline (VIBRA-TABS) 100 MG tablet Take 1 tablet (100 mg total) by mouth 2 (two) times daily. With food   [DISCONTINUED] amoxicillin-clavulanate (AUGMENTIN) 875-125 MG tablet Take 1 tablet by mouth 2 (two) times daily. (Patient not taking: Reported on 08/31/2021)   [DISCONTINUED] predniSONE (DELTASONE) 10 MG tablet Take one tab 3 x day for 3 days, then take one tab 2 x a day for 3 days and then take one tab a day for 3 days for copd (Patient not taking: Reported on 08/31/2021)   No facility-administered encounter medications on file as of 08/31/2021.    Surgical History: Past Surgical History:  Procedure Laterality Date   cartaract surgery both eye Bilateral    CORONARY ARTERY BYPASS GRAFT Bilateral    CYSTOSCOPY     HERNIA REPAIR     knee replacment Bilateral    retina surgery on right eye  Right 05/16/2017   TONSILLECTOMY Bilateral  Medical History: Past Medical History:  Diagnosis Date   Asthma    Atrial fibrillation ([place])    Coronary artery disease    Hyperlipidemia    Hypertension     Family History: Family History  Problem Relation Age of Onset   Diabetes Mother    Bowel Disease Father    Alcohol abuse Father     Social History   Socioeconomic History   Marital status: Married    Spouse name: Not on file   Number of children: Not on file   Years of education: Not on file   Highest education level: Not on file  Occupational History   Not on file  Tobacco Use   Smoking status: Former   Smokeless tobacco: Never   Substance and Sexual Activity   Alcohol use: No   Drug use: No   Sexual activity: Not on file  Other Topics Concern   Not on file  Social History Narrative   Not on file   Social Determinants of Health   Financial Resource Strain: Low Risk    Difficulty of Paying Living Expenses: Not very hard  Food Insecurity: Not on file  Transportation Needs: Not on file  Physical Activity: Not on file  Stress: Not on file  Social Connections: Not on file  Intimate Partner Violence: Not on file      Review of Systems  Constitutional:  Positive for fatigue. Negative for chills, fever and unexpected weight change.  HENT:  Positive for congestion, postnasal drip, rhinorrhea, sinus pressure, sinus pain, sneezing and sore throat.   Eyes:  Negative for redness.  Respiratory:  Positive for cough, chest tightness, shortness of breath and wheezing.   Cardiovascular: Negative.  Negative for chest pain and palpitations.  Gastrointestinal:  Negative for abdominal pain, constipation, diarrhea, nausea and vomiting.  Genitourinary:  Negative for dysuria and frequency.  Musculoskeletal:  Negative for arthralgias, back pain, joint swelling, myalgias and neck pain.  Skin:  Negative for rash.  Neurological:  Positive for headaches. Negative for tremors and numbness.  Hematological:  Negative for adenopathy. Does not bruise/bleed easily.  Psychiatric/Behavioral:  Negative for behavioral problems (Depression), sleep disturbance and suicidal ideas. The patient is not nervous/anxious.    Vital Signs: BP 112/71    Pulse 62    Temp 98.5 F (36.9 C)    Resp 16    Ht 5\' 8"  (1.727 m)    Wt 200 lb (90.7 kg)    SpO2 97%    BMI 30.41 kg/m    Physical Exam Vitals reviewed.  Constitutional:      General: He is not in acute distress.    Appearance: Normal appearance. He is obese. He is not ill-appearing.  HENT:     Head: Normocephalic and atraumatic.  Eyes:     Pupils: Pupils are equal, round, and reactive to  light.  Cardiovascular:     Rate and Rhythm: Normal rate and regular rhythm.  Pulmonary:     Effort: Pulmonary effort is normal. No respiratory distress.  Neurological:     Mental Status: He is alert and oriented to person, place, and time.     Cranial Nerves: No cranial nerve deficit.     Coordination: Coordination normal.     Gait: Gait normal.  Psychiatric:        Mood and Affect: Mood normal.        Behavior: Behavior normal.       Assessment/Plan: 1. Acute recurrent pansinusitis Empiric antibiotic treatment prescribed.  Prenisone taper also sent to pharmacy to decrease inflammation. If the infection does not resolve, will consider ENT referral.  - levofloxacin (LEVAQUIN) 750 MG tablet; Take 1 tablet (750 mg total) by mouth daily for 7 days.  Dispense: 7 tablet; Refill: 0 - predniSONE (DELTASONE) 10 MG tablet; Take one tab 3 x day for 3 days, then take one tab 2 x a day for 3 days and then take one tab a day for 3 days for copd  Dispense: 18 tablet; Refill: 0  2. Acute cough May use OTC cough medicine of choice.    General Counseling: jamesen stahnke understanding of the findings of todays visit and agrees with plan of treatment. I have discussed any further diagnostic evaluation that may be needed or ordered today. We also reviewed his medications today. he has been encouraged to call the office with any questions or concerns that should arise related to todays visit.    No orders of the defined types were placed in this encounter.   Meds ordered this encounter  Medications   levofloxacin (LEVAQUIN) 750 MG tablet    Sig: Take 1 tablet (750 mg total) by mouth daily for 7 days.    Dispense:  7 tablet    Refill:  0   predniSONE (DELTASONE) 10 MG tablet    Sig: Take one tab 3 x day for 3 days, then take one tab 2 x a day for 3 days and then take one tab a day for 3 days for copd    Dispense:  18 tablet    Refill:  0    Return in about 3 months (around 11/29/2021) for  F/U, med refill, Shenia Alan PCP.   Total time spent:20 Minutes Time spent includes review of chart, medications, test results, and follow up plan with the patient.   [place] Controlled Substance Database was reviewed by me.  This patient was seen by Jonetta Osgood, FNP-C in collaboration with Dr. Clayborn Bigness as a part of collaborative care agreement.   Venida Tsukamoto R. Valetta Fuller, MSN, FNP-C Internal medicine

## 2021-09-23 ENCOUNTER — Telehealth: Payer: Self-pay | Admitting: Student-PharmD

## 2021-09-23 NOTE — Progress Notes (Addendum)
General Review Call   James Mathews, James Mathews [id number] 46 years, Male  DOB: 1941/08/06  M: [id number]  General Review [facility]) Completed by Charlann Lange on 09/23/2021  Chart Review What recent interventions/DTPs have been made by any provider to improve the patient's conditions in the last 3 months?:  Consults: 08/20/21 Cardiology Flossie Dibble and Wyona Almas For Longstanding persistent atrial fibrillation.  Office Visits: 08/31/21 Jonetta Osgood, NP. For Acute recurrent pansinusitis. STARTED Levofloxacin 750 mg daily. STOPPED Doxycylione, Benzonatate, and Augmentin.  Any recent hospitalizations or ED visits since last visit with CPP?: No  Adherence Review Adherence rates for STAR metric medications: Atorvastatin 20 mg 06/14/21 90 DS  Adherence rates for medications indicated for disease state being reviewed: Atorvastatin 20 mg 06/14/21 90 DS  Does the patient have >5 day gap between last estimated fill dates for any of the above medications?: Yes  Reasons for medication gaps: Atorvastatin 20 mg 06/14/21 90 DS-Patient needs to get a refill.  Disease State Questions Able to connect with the Patient?: Yes  Did patient have any problems with their health recently?: No  Did patient have any problems with their pharmacy?: No  Does patient have any issues or side effects with their medications?: No  Additional  information to pass to Patient's CPP?: No  Anything we can do to help take better care of Patient?: No  Charlann Lange, [place] Pharmacist Assistant [phone]  Pharmacist Review Adherence gaps identified?: No Drug Therapy Problems identified?: No Assessment: Controlled  5 minutes spent in review, coordination, and documentation.  Reviewed by: Alena Bills, PharmD Clinical Pharmacist [phone]

## 2021-09-28 DIAGNOSIS — Z01810 Encounter for preprocedural cardiovascular examination: Secondary | ICD-10-CM | POA: Diagnosis not present

## 2021-09-28 DIAGNOSIS — I4819 Other persistent atrial fibrillation: Secondary | ICD-10-CM | POA: Diagnosis not present

## 2021-09-28 DIAGNOSIS — E782 Mixed hyperlipidemia: Secondary | ICD-10-CM | POA: Diagnosis not present

## 2021-09-28 DIAGNOSIS — Z7901 Long term (current) use of anticoagulants: Secondary | ICD-10-CM | POA: Diagnosis not present

## 2021-09-28 DIAGNOSIS — I493 Ventricular premature depolarization: Secondary | ICD-10-CM | POA: Diagnosis not present

## 2021-10-10 DIAGNOSIS — I10 Essential (primary) hypertension: Secondary | ICD-10-CM | POA: Diagnosis not present

## 2021-10-10 DIAGNOSIS — K219 Gastro-esophageal reflux disease without esophagitis: Secondary | ICD-10-CM | POA: Diagnosis not present

## 2021-10-10 DIAGNOSIS — E782 Mixed hyperlipidemia: Secondary | ICD-10-CM | POA: Diagnosis not present

## 2021-11-09 DIAGNOSIS — I700 Atherosclerosis of aorta: Secondary | ICD-10-CM | POA: Diagnosis not present

## 2021-11-09 DIAGNOSIS — I493 Ventricular premature depolarization: Secondary | ICD-10-CM | POA: Diagnosis not present

## 2021-11-09 DIAGNOSIS — I351 Nonrheumatic aortic (valve) insufficiency: Secondary | ICD-10-CM | POA: Diagnosis not present

## 2021-11-09 DIAGNOSIS — Z951 Presence of aortocoronary bypass graft: Secondary | ICD-10-CM | POA: Diagnosis not present

## 2021-11-09 DIAGNOSIS — I2510 Atherosclerotic heart disease of native coronary artery without angina pectoris: Secondary | ICD-10-CM | POA: Diagnosis present

## 2021-11-09 DIAGNOSIS — K219 Gastro-esophageal reflux disease without esophagitis: Secondary | ICD-10-CM | POA: Diagnosis present

## 2021-11-09 DIAGNOSIS — Z7901 Long term (current) use of anticoagulants: Secondary | ICD-10-CM | POA: Diagnosis not present

## 2021-11-09 DIAGNOSIS — I959 Hypotension, unspecified: Secondary | ICD-10-CM | POA: Diagnosis not present

## 2021-11-09 DIAGNOSIS — I4891 Unspecified atrial fibrillation: Secondary | ICD-10-CM | POA: Diagnosis not present

## 2021-11-09 DIAGNOSIS — Z96653 Presence of artificial knee joint, bilateral: Secondary | ICD-10-CM | POA: Diagnosis present

## 2021-11-09 DIAGNOSIS — I739 Peripheral vascular disease, unspecified: Secondary | ICD-10-CM | POA: Diagnosis present

## 2021-11-09 DIAGNOSIS — I6523 Occlusion and stenosis of bilateral carotid arteries: Secondary | ICD-10-CM | POA: Diagnosis present

## 2021-11-09 DIAGNOSIS — Z01810 Encounter for preprocedural cardiovascular examination: Secondary | ICD-10-CM | POA: Diagnosis not present

## 2021-11-09 DIAGNOSIS — Z006 Encounter for examination for normal comparison and control in clinical research program: Secondary | ICD-10-CM | POA: Diagnosis not present

## 2021-11-09 DIAGNOSIS — E782 Mixed hyperlipidemia: Secondary | ICD-10-CM | POA: Diagnosis present

## 2021-11-09 DIAGNOSIS — I10 Essential (primary) hypertension: Secondary | ICD-10-CM | POA: Diagnosis present

## 2021-11-09 DIAGNOSIS — I361 Nonrheumatic tricuspid (valve) insufficiency: Secondary | ICD-10-CM | POA: Diagnosis not present

## 2021-11-09 DIAGNOSIS — Z95818 Presence of other cardiac implants and grafts: Secondary | ICD-10-CM | POA: Diagnosis not present

## 2021-11-09 DIAGNOSIS — I4819 Other persistent atrial fibrillation: Secondary | ICD-10-CM | POA: Diagnosis present

## 2021-11-09 DIAGNOSIS — Z87898 Personal history of other specified conditions: Secondary | ICD-10-CM | POA: Diagnosis not present

## 2021-11-09 DIAGNOSIS — Z20822 Contact with and (suspected) exposure to covid-19: Secondary | ICD-10-CM | POA: Diagnosis present

## 2021-11-09 DIAGNOSIS — Z7982 Long term (current) use of aspirin: Secondary | ICD-10-CM | POA: Diagnosis not present

## 2021-11-10 DIAGNOSIS — I2510 Atherosclerotic heart disease of native coronary artery without angina pectoris: Secondary | ICD-10-CM | POA: Diagnosis present

## 2021-11-10 DIAGNOSIS — Z87898 Personal history of other specified conditions: Secondary | ICD-10-CM | POA: Diagnosis not present

## 2021-11-10 DIAGNOSIS — I10 Essential (primary) hypertension: Secondary | ICD-10-CM | POA: Diagnosis not present

## 2021-11-10 DIAGNOSIS — Z951 Presence of aortocoronary bypass graft: Secondary | ICD-10-CM | POA: Diagnosis not present

## 2021-11-10 DIAGNOSIS — I6523 Occlusion and stenosis of bilateral carotid arteries: Secondary | ICD-10-CM | POA: Diagnosis present

## 2021-11-10 DIAGNOSIS — E782 Mixed hyperlipidemia: Secondary | ICD-10-CM | POA: Diagnosis present

## 2021-11-10 DIAGNOSIS — Z7982 Long term (current) use of aspirin: Secondary | ICD-10-CM | POA: Diagnosis not present

## 2021-11-10 DIAGNOSIS — I739 Peripheral vascular disease, unspecified: Secondary | ICD-10-CM | POA: Diagnosis present

## 2021-11-10 DIAGNOSIS — I361 Nonrheumatic tricuspid (valve) insufficiency: Secondary | ICD-10-CM | POA: Diagnosis not present

## 2021-11-10 DIAGNOSIS — I959 Hypotension, unspecified: Secondary | ICD-10-CM | POA: Diagnosis not present

## 2021-11-10 DIAGNOSIS — I700 Atherosclerosis of aorta: Secondary | ICD-10-CM | POA: Diagnosis not present

## 2021-11-10 DIAGNOSIS — Z96653 Presence of artificial knee joint, bilateral: Secondary | ICD-10-CM | POA: Diagnosis present

## 2021-11-10 DIAGNOSIS — I4819 Other persistent atrial fibrillation: Secondary | ICD-10-CM | POA: Diagnosis present

## 2021-11-10 DIAGNOSIS — I4891 Unspecified atrial fibrillation: Secondary | ICD-10-CM | POA: Diagnosis not present

## 2021-11-10 DIAGNOSIS — Z006 Encounter for examination for normal comparison and control in clinical research program: Secondary | ICD-10-CM | POA: Diagnosis not present

## 2021-11-10 DIAGNOSIS — I351 Nonrheumatic aortic (valve) insufficiency: Secondary | ICD-10-CM | POA: Diagnosis not present

## 2021-11-10 DIAGNOSIS — K219 Gastro-esophageal reflux disease without esophagitis: Secondary | ICD-10-CM | POA: Diagnosis present

## 2021-11-10 DIAGNOSIS — Z95818 Presence of other cardiac implants and grafts: Secondary | ICD-10-CM | POA: Diagnosis not present

## 2021-11-10 DIAGNOSIS — Z20822 Contact with and (suspected) exposure to covid-19: Secondary | ICD-10-CM | POA: Diagnosis present

## 2021-11-10 HISTORY — PX: LEFT ATRIAL APPENDAGE OCCLUSION: SHX173A

## 2021-11-11 DIAGNOSIS — I4819 Other persistent atrial fibrillation: Secondary | ICD-10-CM | POA: Diagnosis not present

## 2021-11-11 DIAGNOSIS — Z95818 Presence of other cardiac implants and grafts: Secondary | ICD-10-CM | POA: Diagnosis not present

## 2021-11-11 DIAGNOSIS — Z87898 Personal history of other specified conditions: Secondary | ICD-10-CM | POA: Diagnosis not present

## 2021-11-22 ENCOUNTER — Other Ambulatory Visit: Payer: Self-pay | Admitting: Internal Medicine

## 2021-11-22 DIAGNOSIS — K219 Gastro-esophageal reflux disease without esophagitis: Secondary | ICD-10-CM

## 2021-11-24 ENCOUNTER — Other Ambulatory Visit: Payer: Self-pay

## 2021-11-24 ENCOUNTER — Encounter: Payer: Self-pay | Admitting: Nurse Practitioner

## 2021-11-24 ENCOUNTER — Ambulatory Visit (INDEPENDENT_AMBULATORY_CARE_PROVIDER_SITE_OTHER): Payer: Medicare Other | Admitting: Nurse Practitioner

## 2021-11-24 VITALS — BP 114/74 | HR 85 | Temp 98.3°F | Resp 16 | Ht 68.0 in | Wt 194.2 lb

## 2021-11-24 DIAGNOSIS — K219 Gastro-esophageal reflux disease without esophagitis: Secondary | ICD-10-CM

## 2021-11-24 DIAGNOSIS — I480 Paroxysmal atrial fibrillation: Secondary | ICD-10-CM | POA: Diagnosis not present

## 2021-11-24 DIAGNOSIS — I10 Essential (primary) hypertension: Secondary | ICD-10-CM | POA: Diagnosis not present

## 2021-11-24 NOTE — Progress Notes (Signed)
[facility] ?[address] ?[place], [place] [address] ? ?Internal MEDICINE  ?Office Visit Note ? ?Patient Name: James Mathews [place] ? [id number]  ?[id number] ? ?Date of Service: 11/24/2021 ? ?Chief Complaint  ?Patient presents with  ? Follow-up  ? Hyperlipidemia  ? Hypertension  ? Asthma  ? Medication Refill  ? ? ?HPI ?Yeshua presents for follow-up visit for hypertension hyperlipidemia and asthma.  Patient has a history of persistent atrial fibrillation and reports that he had the Watchman device placed on March 1.  He is now taking aspirin 81 mg daily and Plavix 75 mg daily.  He is doing well since his surgery.  Vital signs are stable and within normal limits.  Patient has lost 6 pounds since his previous office visit.  He reports that he has cut back on sweets and soft drinks and he just has not been as hungry as he usually is. ?He does not need any refills at this time and is scheduled to return for his annual wellness visit and physical exam on June 22. ? ? ? ?Current Medication: ?Outpatient Encounter Medications as of 11/24/2021  ?Medication Sig  ? albuterol (VENTOLIN HFA) 108 (90 Base) MCG/ACT inhaler INHALE 2 PUFFS INTO THE LUNGS EVERY 6 HOURS AS NEEDED FOR WHEEZING OR SHORTNESS OF BREATH  ? amLODipine (NORVASC) 10 MG tablet Take 10 mg by mouth daily.  ? ASPIRIN 81 PO Take 81 mg by mouth. 1 daily  ? atorvastatin (LIPITOR) 20 MG tablet Take 20 mg by mouth daily.  ? Calcium-Magnesium-Vitamin D 400-166.7-133.3 MG-MG-UNIT TABS Take by mouth.  ? clopidogrel (PLAVIX) 75 MG tablet Take 75 mg by mouth daily.  ? fluticasone (FLONASE) 50 MCG/ACT nasal spray SHAKE LIQUID AND USE 2 SPRAYS IN EACH NOSTRIL DAILY  ? montelukast (SINGULAIR) 10 MG tablet TAKE 1 TABLET BY MOUTH DAILY FOR CHRONIC ALLERGIC RHINITIS/ASTHMA  ? pantoprazole (PROTONIX) 40 MG tablet TAKE 1 TABLET BY MOUTH DAILY  ? potassium chloride (KLOR-CON) 10 MEQ tablet TAKE 1 TABLET BY MOUTH DAILY  ? vitamin C (ASCORBIC ACID) 500 MG tablet Take 500 mg by  mouth daily.  ? vitamin E 1000 UNIT capsule Take 1,000 Units by mouth daily.  ? [DISCONTINUED] aspirin 325 MG EC tablet Take 325 mg by mouth daily.  ? [DISCONTINUED] cholecalciferol (VITAMIN D3) 10 MCG (400 UNIT) TABS tablet Take 400 Units by mouth. (Patient not taking: Reported on 11/24/2021)  ? [DISCONTINUED] cyanocobalamin 1000 MCG tablet Take 1,000 mcg by mouth daily. (Patient not taking: Reported on 11/24/2021)  ? [DISCONTINUED] desloratadine (CLARINEX) 5 MG tablet Take 1 tablet (5 mg total) by mouth daily. (Patient not taking: Reported on 11/24/2021)  ? [DISCONTINUED] Multiple Vitamins-Minerals (MULTIVITAMIN ADULT PO) Take by mouth daily. (Patient not taking: Reported on 11/24/2021)  ? [DISCONTINUED] predniSONE (DELTASONE) 10 MG tablet Take one tab 3 x day for 3 days, then take one tab 2 x a day for 3 days and then take one tab a day for 3 days for copd (Patient not taking: Reported on 11/24/2021)  ? ?No facility-administered encounter medications on file as of 11/24/2021.  ? ? ?Surgical History: ?Past Surgical History:  ?Procedure Laterality Date  ? cartaract surgery both eye Bilateral   ? CORONARY ARTERY BYPASS GRAFT Bilateral   ? CYSTOSCOPY    ? HERNIA REPAIR    ? knee replacment Bilateral   ? LEFT ATRIAL APPENDAGE OCCLUSION  11/10/2021  ? retina surgery on right eye  Right 05/16/2017  ? TONSILLECTOMY Bilateral   ? ? ?  Medical History: ?Past Medical History:  ?Diagnosis Date  ? Asthma   ? Atrial fibrillation ([place])   ? Coronary artery disease   ? Hyperlipidemia   ? Hypertension   ? ? ?Family History: ?Family History  ?Problem Relation Age of Onset  ? Diabetes Mother   ? Bowel Disease Father   ? Alcohol abuse Father   ? ? ?Social History  ? ?Socioeconomic History  ? Marital status: Married  ?  Spouse name: Not on file  ? Number of children: Not on file  ? Years of education: Not on file  ? Highest education level: Not on file  ?Occupational History  ? Not on file  ?Tobacco Use  ? Smoking status: Former  ? Smokeless  tobacco: Never  ?Substance and Sexual Activity  ? Alcohol use: No  ? Drug use: No  ? Sexual activity: Not on file  ?Other Topics Concern  ? Not on file  ?Social History Narrative  ? Not on file  ? ?Social Determinants of Health  ? ?Financial Resource Strain: Low Risk   ? Difficulty of Paying Living Expenses: Not very hard  ?Food Insecurity: Not on file  ?Transportation Needs: Not on file  ?Physical Activity: Not on file  ?Stress: Not on file  ?Social Connections: Not on file  ?Intimate Partner Violence: Not on file  ? ? ? ? ?Review of Systems  ?Constitutional:  Negative for chills, fatigue and unexpected weight change.  ?HENT:  Negative for congestion, rhinorrhea, sneezing and sore throat.   ?Eyes:  Negative for redness.  ?Respiratory:  Negative for cough, chest tightness and shortness of breath.   ?Cardiovascular:  Negative for chest pain and palpitations.  ?Gastrointestinal:  Negative for abdominal pain, constipation, diarrhea, nausea and vomiting.  ?Genitourinary:  Negative for dysuria and frequency.  ?Musculoskeletal:  Negative for arthralgias, back pain, joint swelling and neck pain.  ?Skin:  Negative for rash.  ?Neurological: Negative.  Negative for tremors and numbness.  ?Hematological:  Negative for adenopathy. Does not bruise/bleed easily.  ?Psychiatric/Behavioral:  Negative for behavioral problems (Depression), sleep disturbance and suicidal ideas. The patient is not nervous/anxious.   ? ?Vital Signs: ?BP 114/74   Pulse 85   Temp 98.3 ?F (36.8 ?C)   Resp 16   Ht '5\' 8"'$  (1.727 m)   Wt 194 lb 3.2 oz (88.1 kg)   SpO2 99%   BMI 29.53 kg/m?  ? ? ?Physical Exam ?Vitals reviewed.  ?Constitutional:   ?   General: He is not in acute distress. ?   Appearance: Normal appearance. He is not ill-appearing.  ?HENT:  ?   Head: Normocephalic and atraumatic.  ?Eyes:  ?   Pupils: Pupils are equal, round, and reactive to light.  ?Cardiovascular:  ?   Rate and Rhythm: Normal rate and regular rhythm.  ?Pulmonary:  ?    Effort: Pulmonary effort is normal. No respiratory distress.  ?Neurological:  ?   Mental Status: He is alert and oriented to person, place, and time.  ?Psychiatric:     ?   Mood and Affect: Mood normal.     ?   Behavior: Behavior normal.  ? ? ? ? ? ?Assessment/Plan: ?1. Essential hypertension ?Patient's blood pressure is well controlled with current medications.  No refills needed at this time ? ?2. Paroxysmal atrial fibrillation (HCC) ?Patient recently had Watchman device placed and is doing well after surgery.  He was started on aspirin 81 mg daily and Plavix 75 mg daily.  As PCP,  we will manage his refills of these medications and he will call when he needs his first refills ordered. ? ?3. Gastroesophageal reflux disease without esophagitis ?Acid reflux is well controlled with current medication.  No refills needed at this time ? ? ?General Counseling: abou sterkel understanding of the findings of todays visit and agrees with plan of treatment. I have discussed any further diagnostic evaluation that may be needed or ordered today. We also reviewed his medications today. he has been encouraged to call the office with any questions or concerns that should arise related to todays visit. ? ? ? ?No orders of the defined types were placed in this encounter. ? ? ?No orders of the defined types were placed in this encounter. ? ? ?Return in about 3 months (around 03/03/2022) for previously scheduled, CPE, Janmarie Smoot PCP. ? ? ?Total time spent: 30 minutes ?Time spent includes review of chart, medications, test results, and follow up plan with the patient.  ? ?[place] Controlled Substance Database was reviewed by me. ? ?This patient was seen by Jonetta Osgood, FNP-C in collaboration with Dr. Clayborn Bigness as a part of collaborative care agreement. ? ? ?Vesna Kable R. Valetta Fuller, MSN, FNP-C ?Internal medicine  ?

## 2021-12-08 ENCOUNTER — Telehealth: Payer: Medicare Other

## 2021-12-21 DIAGNOSIS — L821 Other seborrheic keratosis: Secondary | ICD-10-CM | POA: Diagnosis not present

## 2021-12-21 DIAGNOSIS — D2262 Melanocytic nevi of left upper limb, including shoulder: Secondary | ICD-10-CM | POA: Diagnosis not present

## 2021-12-21 DIAGNOSIS — L570 Actinic keratosis: Secondary | ICD-10-CM | POA: Diagnosis not present

## 2021-12-21 DIAGNOSIS — D225 Melanocytic nevi of trunk: Secondary | ICD-10-CM | POA: Diagnosis not present

## 2021-12-21 DIAGNOSIS — Z85828 Personal history of other malignant neoplasm of skin: Secondary | ICD-10-CM | POA: Diagnosis not present

## 2021-12-21 DIAGNOSIS — D485 Neoplasm of uncertain behavior of skin: Secondary | ICD-10-CM | POA: Diagnosis not present

## 2021-12-21 DIAGNOSIS — D2261 Melanocytic nevi of right upper limb, including shoulder: Secondary | ICD-10-CM | POA: Diagnosis not present

## 2022-01-05 DIAGNOSIS — I6523 Occlusion and stenosis of bilateral carotid arteries: Secondary | ICD-10-CM | POA: Diagnosis not present

## 2022-01-05 DIAGNOSIS — I25708 Atherosclerosis of coronary artery bypass graft(s), unspecified, with other forms of angina pectoris: Secondary | ICD-10-CM | POA: Diagnosis not present

## 2022-01-05 DIAGNOSIS — I351 Nonrheumatic aortic (valve) insufficiency: Secondary | ICD-10-CM | POA: Diagnosis not present

## 2022-01-05 DIAGNOSIS — Z95818 Presence of other cardiac implants and grafts: Secondary | ICD-10-CM | POA: Diagnosis not present

## 2022-01-05 DIAGNOSIS — I714 Abdominal aortic aneurysm, without rupture, unspecified: Secondary | ICD-10-CM | POA: Diagnosis not present

## 2022-01-05 DIAGNOSIS — I712 Thoracic aortic aneurysm, without rupture, unspecified: Secondary | ICD-10-CM | POA: Diagnosis not present

## 2022-01-05 DIAGNOSIS — E782 Mixed hyperlipidemia: Secondary | ICD-10-CM | POA: Diagnosis not present

## 2022-01-05 DIAGNOSIS — I4819 Other persistent atrial fibrillation: Secondary | ICD-10-CM | POA: Diagnosis not present

## 2022-01-05 DIAGNOSIS — I10 Essential (primary) hypertension: Secondary | ICD-10-CM | POA: Diagnosis not present

## 2022-02-14 ENCOUNTER — Other Ambulatory Visit: Payer: Self-pay | Admitting: Nurse Practitioner

## 2022-02-14 DIAGNOSIS — E876 Hypokalemia: Secondary | ICD-10-CM

## 2022-02-17 DIAGNOSIS — D235 Other benign neoplasm of skin of trunk: Secondary | ICD-10-CM | POA: Diagnosis not present

## 2022-02-17 DIAGNOSIS — D225 Melanocytic nevi of trunk: Secondary | ICD-10-CM | POA: Diagnosis not present

## 2022-02-21 ENCOUNTER — Telehealth: Payer: Self-pay

## 2022-02-21 NOTE — Telephone Encounter (Signed)
PA for pantoprazole 40 mg tabs approved from 01-22-22 to 02-21-23. Spoke to pt and let him know he can check with his pharmacy.

## 2022-03-03 ENCOUNTER — Ambulatory Visit (INDEPENDENT_AMBULATORY_CARE_PROVIDER_SITE_OTHER): Payer: Medicare Other | Admitting: Nurse Practitioner

## 2022-03-03 ENCOUNTER — Encounter: Payer: Self-pay | Admitting: Nurse Practitioner

## 2022-03-03 VITALS — BP 115/60 | HR 60 | Temp 98.2°F | Resp 16 | Ht 68.0 in | Wt 184.8 lb

## 2022-03-03 DIAGNOSIS — K219 Gastro-esophageal reflux disease without esophagitis: Secondary | ICD-10-CM

## 2022-03-03 DIAGNOSIS — E782 Mixed hyperlipidemia: Secondary | ICD-10-CM | POA: Diagnosis not present

## 2022-03-03 DIAGNOSIS — I739 Peripheral vascular disease, unspecified: Secondary | ICD-10-CM

## 2022-03-03 DIAGNOSIS — Z0001 Encounter for general adult medical examination with abnormal findings: Secondary | ICD-10-CM

## 2022-03-03 DIAGNOSIS — I480 Paroxysmal atrial fibrillation: Secondary | ICD-10-CM

## 2022-03-03 DIAGNOSIS — Z760 Encounter for issue of repeat prescription: Secondary | ICD-10-CM

## 2022-03-03 DIAGNOSIS — J3089 Other allergic rhinitis: Secondary | ICD-10-CM

## 2022-03-03 DIAGNOSIS — R300 Dysuria: Secondary | ICD-10-CM

## 2022-03-03 MED ORDER — FLUTICASONE PROPIONATE 50 MCG/ACT NA SUSP: NASAL | 3 refills | Status: AC

## 2022-03-03 MED ORDER — PANTOPRAZOLE SODIUM 40 MG PO TBEC: 40.0000 mg | DELAYED_RELEASE_TABLET | Freq: Every day | ORAL | 1 refills | Status: DC

## 2022-03-03 NOTE — Progress Notes (Signed)
[facility] [place], [place] [address]  Internal MEDICINE  Office Visit Note  Patient Name: James Mathews  [id number]  [id number]  Date of Service: 03/03/2022  Chief Complaint  Patient presents with   Medicare Wellness    Right ear sometimes sore, pt fell June 4th and bruised or broke ribs left side, did not have imaging done     HPI Yasuo presents for an annual well visit and physical exam.  --well appearing 81 yo male with hypertension, hyperlipidemia, atrial fibrillation, CAD, and asthma.  -- lives at home with wife. Retired, worked for the [employer] for 46 years. has 2 adult sons and 2 young grandchildren.   - blood pressure is wnl, stable on meds - asthma is controlled,has a rescue inhaler, no maintenance inhaler.  -has some arthritis but no new or worsening pains  --hx AFIB, watchman device placed in march, has been doing well since then, still taking ASA and plavix.  --right ear is sore sometimes.  --fell on June 4th, thinks he bruised or broke some ribs on the left side but never had any imaging done.      Current Medication: Outpatient Encounter Medications as of 03/03/2022  Medication Sig   albuterol (VENTOLIN HFA) 108 (90 Base) MCG/ACT inhaler INHALE 2 PUFFS INTO THE LUNGS EVERY 6 HOURS AS NEEDED FOR WHEEZING OR SHORTNESS OF BREATH   amLODipine (NORVASC) 10 MG tablet Take 10 mg by mouth daily.   ASPIRIN 81 PO Take 81 mg by mouth. 1 daily   atorvastatin (LIPITOR) 20 MG tablet Take 20 mg by mouth daily.   Calcium-Magnesium-Vitamin D 400-166.7-133.3 MG-MG-UNIT TABS Take by mouth.   clopidogrel (PLAVIX) 75 MG tablet Take 75 mg by mouth daily.   montelukast (SINGULAIR) 10 MG tablet TAKE 1 TABLET BY MOUTH DAILY FOR CHRONIC ALLERGIC RHINITIS/ASTHMA   potassium chloride (KLOR-CON) 10 MEQ tablet TAKE 1 TABLET BY MOUTH DAILY   vitamin C (ASCORBIC ACID) 500 MG tablet Take 500 mg by mouth daily.   vitamin E 1000 UNIT capsule Take 1,000 Units  by mouth daily.   [DISCONTINUED] fluticasone (FLONASE) 50 MCG/ACT nasal spray SHAKE LIQUID AND USE 2 SPRAYS IN EACH NOSTRIL DAILY   [DISCONTINUED] pantoprazole (PROTONIX) 40 MG tablet TAKE 1 TABLET BY MOUTH DAILY   fluticasone (FLONASE) 50 MCG/ACT nasal spray SHAKE LIQUID AND USE 2 SPRAYS IN EACH NOSTRIL DAILY   pantoprazole (PROTONIX) 40 MG tablet Take 1 tablet (40 mg total) by mouth daily.   No facility-administered encounter medications on file as of 03/03/2022.    Surgical History: Past Surgical History:  Procedure Laterality Date   cartaract surgery both eye Bilateral    CORONARY ARTERY BYPASS GRAFT Bilateral    CYSTOSCOPY     HERNIA REPAIR     knee replacment Bilateral    LEFT ATRIAL APPENDAGE OCCLUSION  11/10/2021   retina surgery on right eye  Right 05/16/2017   TONSILLECTOMY Bilateral     Medical History: Past Medical History:  Diagnosis Date   Asthma    Atrial fibrillation ([place])    Coronary artery disease    Hyperlipidemia    Hypertension     Family History: Family History  Problem Relation Age of Onset   Diabetes Mother    Bowel Disease Father    Alcohol abuse Father     Social History   Socioeconomic History   Marital status: Married    Spouse name: Not on file   Number of children: Not on  file   Years of education: Not on file   Highest education level: Not on file  Occupational History   Not on file  Tobacco Use   Smoking status: Former   Smokeless tobacco: Never  Substance and Sexual Activity   Alcohol use: No   Drug use: No   Sexual activity: Not on file  Other Topics Concern   Not on file  Social History Narrative   Not on file   Social Determinants of Health   Financial Resource Strain: Low Risk  (03/23/2021)   Overall Financial Resource Strain (CARDIA)    Difficulty of Paying Living Expenses: Not very hard  Food Insecurity: Not on file  Transportation Needs: Not on file  Physical Activity: Not on file  Stress: Not on file  Social  Connections: Not on file  Intimate Partner Violence: Not on file      Review of Systems  Constitutional:  Negative for activity change, appetite change, chills, fatigue, fever and unexpected weight change.  HENT: Negative.  Negative for congestion, ear pain, rhinorrhea, sore throat and trouble swallowing.   Eyes: Negative.   Respiratory: Negative.  Negative for cough, chest tightness, shortness of breath and wheezing.   Cardiovascular: Negative.  Negative for chest pain and palpitations.  Gastrointestinal: Negative.  Negative for abdominal pain, blood in stool, constipation, diarrhea, nausea and vomiting.  Endocrine: Negative.   Genitourinary: Negative.  Negative for difficulty urinating, dysuria, frequency, hematuria and urgency.  Musculoskeletal: Negative.  Negative for arthralgias, back pain, joint swelling, myalgias and neck pain.  Skin: Negative.  Negative for rash and wound.  Allergic/Immunologic: Negative.  Negative for immunocompromised state.  Neurological: Negative.  Negative for dizziness, seizures, numbness and headaches.  Hematological: Negative.   Psychiatric/Behavioral: Negative.  Negative for behavioral problems, self-injury and suicidal ideas. The patient is not nervous/anxious.     Vital Signs: BP 115/60   Pulse 60   Temp 98.2 F (36.8 C)   Resp 16   Ht 5' 8"  (1.727 m)   Wt 184 lb 12.8 oz (83.8 kg)   SpO2 98%   BMI 28.10 kg/m    Physical Exam Vitals reviewed.  Constitutional:      General: He is awake. He is not in acute distress.    Appearance: Normal appearance. He is well-developed and well-groomed. He is not ill-appearing or diaphoretic.  HENT:     Head: Normocephalic and atraumatic.     Right Ear: Tympanic membrane, ear canal and external ear normal.     Left Ear: Tympanic membrane, ear canal and external ear normal.     Nose: Nose normal. No congestion or rhinorrhea.     Mouth/Throat:     Lips: Pink.     Mouth: Mucous membranes are moist.      Pharynx: Oropharynx is clear. Uvula midline. No oropharyngeal exudate.  Eyes:     General: Lids are normal. Vision grossly intact. Gaze aligned appropriately. No scleral icterus.       Right eye: No discharge.        Left eye: No discharge.     Conjunctiva/sclera: Conjunctivae normal.     Pupils: Pupils are equal, round, and reactive to light.     Funduscopic exam:    Right eye: Red reflex present.        Left eye: Red reflex present. Neck:     Thyroid: No thyromegaly.     Vascular: No JVD.     Trachea: Trachea and phonation normal. No tracheal deviation.  Cardiovascular:     Rate and Rhythm: Normal rate and regular rhythm.     Pulses: Normal pulses.     Heart sounds: Normal heart sounds, S1 normal and S2 normal. No murmur heard.    No friction rub. No gallop.  Pulmonary:     Effort: Pulmonary effort is normal. No accessory muscle usage or respiratory distress.     Breath sounds: Normal breath sounds and air entry. No stridor. No wheezing or rales.  Chest:     Chest wall: No tenderness.  Abdominal:     General: Bowel sounds are normal. There is no distension.     Palpations: Abdomen is soft. There is no shifting dullness, fluid wave, mass or pulsatile mass.     Tenderness: There is no abdominal tenderness. There is no guarding or rebound.  Musculoskeletal:        General: No tenderness or deformity. Normal range of motion.     Cervical back: Normal range of motion and neck supple.     Right lower leg: No edema.     Left lower leg: No edema.  Lymphadenopathy:     Cervical: No cervical adenopathy.  Skin:    General: Skin is warm and dry.     Capillary Refill: Capillary refill takes less than 2 seconds.     Coloration: Skin is not pale.     Findings: No erythema or rash.  Neurological:     Mental Status: He is alert.     Cranial Nerves: No cranial nerve deficit.     Motor: No abnormal muscle tone.     Coordination: Coordination normal.     Deep Tendon Reflexes: Reflexes are  normal and symmetric.  Psychiatric:        Behavior: Behavior normal. Behavior is cooperative.        Thought Content: Thought content normal.        Judgment: Judgment normal.        Assessment/Plan: 1. Encounter for general adult medical examination with abnormal findings Age-appropriate preventive screenings and vaccinations discussed, annual physical exam completed. Routine labs for health maintenance ordered, see below. PHM updated.  - CMP14+EGFR - Lipid Profile  2. Paroxysmal atrial fibrillation (HCC) Watchman device placed in march - CMP14+EGFR - Lipid Profile  3. Mixed hyperlipidemia - CMP14+EGFR - Lipid Profile  4. Dysuria - UA/M w/rflx Culture, Routine - Microscopic Examination  5. Medication refill - pantoprazole (PROTONIX) 40 MG tablet; Take 1 tablet (40 mg total) by mouth daily.  Dispense: 90 tablet; Refill: 1 - fluticasone (FLONASE) 50 MCG/ACT nasal spray; SHAKE LIQUID AND USE 2 SPRAYS IN EACH NOSTRIL DAILY  Dispense: 16 g; Refill: 3      General Counseling: Ante verbalizes understanding of the findings of todays visit and agrees with plan of treatment. I have discussed any further diagnostic evaluation that may be needed or ordered today. We also reviewed his medications today. he has been encouraged to call the office with any questions or concerns that should arise related to todays visit.    Orders Placed This Encounter  Procedures   Microscopic Examination   CMP14+EGFR   Lipid Profile   UA/M w/rflx Culture, Routine    Meds ordered this encounter  Medications   pantoprazole (PROTONIX) 40 MG tablet    Sig: Take 1 tablet (40 mg total) by mouth daily.    Dispense:  90 tablet    Refill:  1   fluticasone (FLONASE) 50 MCG/ACT nasal spray    Sig: SHAKE  LIQUID AND USE 2 SPRAYS IN EACH NOSTRIL DAILY    Dispense:  16 g    Refill:  3    Return in about 6 months (around 09/02/2022) for F/U, Bently Wyss PCP.   Total time spent:30 Minutes Time  spent includes review of chart, medications, test results, and follow up plan with the patient.   [place] Controlled Substance Database was reviewed by me.  This patient was seen by Jonetta Osgood, FNP-C in collaboration with Dr. Clayborn Bigness as a part of collaborative care agreement.  Ryonna Cimini R. Valetta Fuller, MSN, FNP-C Internal medicine

## 2022-03-04 DIAGNOSIS — I480 Paroxysmal atrial fibrillation: Secondary | ICD-10-CM | POA: Diagnosis not present

## 2022-03-04 DIAGNOSIS — I739 Peripheral vascular disease, unspecified: Secondary | ICD-10-CM | POA: Diagnosis not present

## 2022-03-04 DIAGNOSIS — K219 Gastro-esophageal reflux disease without esophagitis: Secondary | ICD-10-CM | POA: Diagnosis not present

## 2022-03-04 DIAGNOSIS — Z0001 Encounter for general adult medical examination with abnormal findings: Secondary | ICD-10-CM | POA: Diagnosis not present

## 2022-03-04 DIAGNOSIS — J3089 Other allergic rhinitis: Secondary | ICD-10-CM | POA: Diagnosis not present

## 2022-03-04 DIAGNOSIS — E782 Mixed hyperlipidemia: Secondary | ICD-10-CM | POA: Diagnosis not present

## 2022-03-04 LAB — UA/M W/RFLX CULTURE, ROUTINE
Bilirubin, UA: NEGATIVE
Glucose, UA: NEGATIVE
Ketones, UA: NEGATIVE
Leukocytes,UA: NEGATIVE
Nitrite, UA: NEGATIVE
Protein,UA: NEGATIVE
RBC, UA: NEGATIVE
Specific Gravity, UA: 1.014 (ref 1.005–1.030)
Urobilinogen, Ur: 0.2 mg/dL (ref 0.2–1.0)
pH, UA: 8 — ABNORMAL HIGH (ref 5.0–7.5)

## 2022-03-04 LAB — MICROSCOPIC EXAMINATION
Bacteria, UA: NONE SEEN
Casts: NONE SEEN /lpf
Epithelial Cells (non renal): NONE SEEN /hpf (ref 0–10)
RBC, Urine: NONE SEEN /hpf (ref 0–2)
WBC, UA: NONE SEEN /hpf (ref 0–5)

## 2022-03-05 LAB — LIPID PANEL
Chol/HDL Ratio: 2.5 ratio (ref 0.0–5.0)
Cholesterol, Total: 102 mg/dL (ref 100–199)
HDL: 41 mg/dL (ref >39)
LDL Chol Calc (NIH): 48 mg/dL (ref 0–99)
Triglycerides: 59 mg/dL (ref 0–149)
VLDL Cholesterol Cal: 13 mg/dL (ref 5–40)

## 2022-03-05 LAB — CMP14+EGFR
ALT: 17 IU/L (ref 0–44)
AST: 23 IU/L (ref 0–40)
Albumin/Globulin Ratio: 2.2 (ref 1.2–2.2)
Albumin: 4.7 g/dL — ABNORMAL HIGH (ref 3.6–4.6)
Alkaline Phosphatase: 76 IU/L (ref 44–121)
BUN/Creatinine Ratio: 13 (ref 10–24)
BUN: 13 mg/dL (ref 8–27)
Bilirubin Total: 0.8 mg/dL (ref 0.0–1.2)
CO2: 22 mmol/L (ref 20–29)
Calcium: 9.4 mg/dL (ref 8.6–10.2)
Chloride: 104 mmol/L (ref 96–106)
Creatinine, Ser: 0.99 mg/dL (ref 0.76–1.27)
Globulin, Total: 2.1 g/dL (ref 1.5–4.5)
Glucose: 103 mg/dL — ABNORMAL HIGH (ref 70–99)
Potassium: 4 mmol/L (ref 3.5–5.2)
Sodium: 140 mmol/L (ref 134–144)
Total Protein: 6.8 g/dL (ref 6.0–8.5)
eGFR: 76 mL/min/{1.73_m2} (ref >59)

## 2022-03-14 NOTE — Progress Notes (Signed)
Call patient Glucose of 103 -- is ok, normal.  Slightly elevated albumin, not clinically significant.  The rest of the metabolic panel is normal, normal kidney and liver function.  Cholesterol levels are normal, HDL went back up to normal level. Decreased risk of CVD. Normal urinalysis.

## 2022-03-16 ENCOUNTER — Telehealth: Payer: Self-pay

## 2022-03-16 NOTE — Telephone Encounter (Signed)
-----   Message from Jonetta Osgood, NP sent at 03/14/2022 10:36 PM EDT ----- Call patient Glucose of 103 -- is ok, normal.  Slightly elevated albumin, not clinically significant.  The rest of the metabolic panel is normal, normal kidney and liver function.  Cholesterol levels are normal, HDL went back up to normal level. Decreased risk of CVD. Normal urinalysis.

## 2022-03-16 NOTE — Telephone Encounter (Signed)
Spoke to pt, provided results  

## 2022-04-24 ENCOUNTER — Encounter: Payer: Self-pay | Admitting: Nurse Practitioner

## 2022-05-11 ENCOUNTER — Other Ambulatory Visit: Payer: Self-pay | Admitting: Nurse Practitioner

## 2022-05-11 DIAGNOSIS — Z760 Encounter for issue of repeat prescription: Secondary | ICD-10-CM

## 2022-06-03 DIAGNOSIS — S2242XA Multiple fractures of ribs, left side, initial encounter for closed fracture: Secondary | ICD-10-CM | POA: Diagnosis not present

## 2022-06-03 DIAGNOSIS — I517 Cardiomegaly: Secondary | ICD-10-CM | POA: Diagnosis not present

## 2022-06-03 DIAGNOSIS — Y92009 Unspecified place in unspecified non-institutional (private) residence as the place of occurrence of the external cause: Secondary | ICD-10-CM | POA: Diagnosis not present

## 2022-06-03 DIAGNOSIS — S299XXA Unspecified injury of thorax, initial encounter: Secondary | ICD-10-CM | POA: Diagnosis not present

## 2022-06-03 DIAGNOSIS — I2510 Atherosclerotic heart disease of native coronary artery without angina pectoris: Secondary | ICD-10-CM | POA: Diagnosis not present

## 2022-06-03 DIAGNOSIS — J479 Bronchiectasis, uncomplicated: Secondary | ICD-10-CM | POA: Diagnosis not present

## 2022-06-03 DIAGNOSIS — W19XXXA Unspecified fall, initial encounter: Secondary | ICD-10-CM | POA: Diagnosis not present

## 2022-06-07 ENCOUNTER — Telehealth: Payer: Self-pay

## 2022-06-07 NOTE — Telephone Encounter (Signed)
Pt called and advised that he fell last Thursday and he went to [facility] they did Xrays and pt advised he has three (3) broken ribs.  He advised that [facility] recommended that he have a CT scan and to have his PCP order it.  I spoke to [facility] and she advised for pt to go to the orthopedic or have the urgent care Dr he seen order the CT scan.  Pt advised he would see what he can do and maybe call the orthopedics at [facility]

## 2022-06-13 ENCOUNTER — Ambulatory Visit (INDEPENDENT_AMBULATORY_CARE_PROVIDER_SITE_OTHER): Payer: Medicare Other | Admitting: Physician Assistant

## 2022-06-13 ENCOUNTER — Encounter: Payer: Self-pay | Admitting: Physician Assistant

## 2022-06-13 VITALS — BP 122/76 | HR 64 | Temp 97.8°F | Resp 16 | Ht 68.0 in | Wt 183.2 lb

## 2022-06-13 DIAGNOSIS — R9389 Abnormal findings on diagnostic imaging of other specified body structures: Secondary | ICD-10-CM | POA: Diagnosis not present

## 2022-06-13 DIAGNOSIS — S2242XD Multiple fractures of ribs, left side, subsequent encounter for fracture with routine healing: Secondary | ICD-10-CM

## 2022-06-13 NOTE — Progress Notes (Signed)
[facility] [place], [place] [address]  Internal MEDICINE  Office Visit Note  Patient Name: James Mathews  [id number]  [id number]  Date of Service: 06/13/2022  Chief Complaint  Patient presents with   Follow-up   Hyperlipidemia   Hypertension    HPI Pt is here for follow up on recent chest xray done by [facility] -Ortho contacted our office after chest xray showed some abnormal findings--patient had a fall and went to walk-in for imaging. He reports he was carrying a bucket of water and tripped on a tree root and fell on his left side. Did not hit his head. -Chest xray done 06/03/22: Nondisplaced left anterior rib fracture suspected of fourth fifth sixth ribs and questionably on ninth and 10th ribs.  Pleuroparenchymal thickening at the right apex with no immediate plain film for comparison.  Follow-up chest CT should be considered if there has been no prior CT imaging, to evaluate for apical mass. -He has been taking ibuprofen to help with his pain from rib fractures. He was given pain medication from ortho, but states it didn't make a big difference than just ibuprofen and has continued this instead. He is able to take full breaths and reports some bruising, but is otherwise doing well -No SOB or wheezing. 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   ASPIRIN 81 PO Take 81 mg by mouth. 1 daily   atorvastatin (LIPITOR) 20 MG tablet Take 20 mg by mouth  daily.   Calcium-Magnesium-Vitamin D 400-166.7-133.3 MG-MG-UNIT TABS Take by mouth.   clopidogrel (PLAVIX) 75 MG tablet Take 75 mg by mouth daily.   fluticasone (FLONASE) 50 MCG/ACT nasal spray SHAKE LIQUID AND USE 2 SPRAYS IN EACH NOSTRIL DAILY   montelukast (SINGULAIR) 10 MG tablet TAKE 1 TABLET BY MOUTH DAILY FOR CHRONIC ALLERGIC RHINITIS/ASTHMA   pantoprazole (PROTONIX) 40 MG tablet TAKE 1 TABLET BY MOUTH DAILY   potassium chloride (KLOR-CON) 10 MEQ tablet TAKE 1 TABLET BY MOUTH DAILY   vitamin C (ASCORBIC ACID) 500 MG tablet Take 500 mg by mouth daily.   vitamin E 1000 UNIT capsule Take 1,000 Units by mouth daily.   No facility-administered encounter medications on file as of 06/13/2022.    Surgical History: Past Surgical History:  Procedure Laterality Date   cartaract surgery both eye Bilateral    CORONARY ARTERY BYPASS GRAFT Bilateral    CYSTOSCOPY     HERNIA REPAIR     knee replacment Bilateral    LEFT ATRIAL APPENDAGE OCCLUSION  11/10/2021   retina surgery on right eye  Right 05/16/2017   TONSILLECTOMY Bilateral     Medical History: Past Medical History:  Diagnosis Date   Asthma    Atrial fibrillation ([place])    Coronary artery disease    Hyperlipidemia    Hypertension     Family History: Family History  Problem Relation Age of Onset   Diabetes Mother    Bowel Disease Father  Alcohol abuse Father     Social History   Socioeconomic History   Marital status: Married    Spouse name: Not on file   Number of children: Not on file   Years of education: Not on file   Highest education level: Not on file  Occupational History   Not on file  Tobacco Use   Smoking status: Former   Smokeless tobacco: Never  Substance and Sexual Activity   Alcohol use: No   Drug use: No   Sexual activity: Not on file  Other Topics Concern   Not on file  Social History Narrative   Not on file   Social Determinants of Health   Financial Resource Strain: Low Risk   (03/23/2021)   Overall Financial Resource Strain (CARDIA)    Difficulty of Paying Living Expenses: Not very hard  Food Insecurity: Not on file  Transportation Needs: Not on file  Physical Activity: Not on file  Stress: Not on file  Social Connections: Not on file  Intimate Partner Violence: Not on file      Review of Systems  Constitutional:  Negative for chills, fatigue and unexpected weight change.  HENT:  Negative for congestion, rhinorrhea, sneezing and sore throat.   Eyes:  Negative for redness.  Respiratory:  Negative for cough, chest tightness, shortness of breath and wheezing.   Cardiovascular:  Negative for chest pain and palpitations.  Gastrointestinal:  Negative for abdominal pain, constipation, diarrhea, nausea and vomiting.  Genitourinary:  Negative for dysuria and frequency.  Musculoskeletal:  Positive for arthralgias. Negative for back pain, joint swelling and neck pain.  Skin:  Negative for rash.  Neurological: Negative.  Negative for tremors and numbness.  Hematological:  Negative for adenopathy. Does not bruise/bleed easily.  Psychiatric/Behavioral:  Negative for behavioral problems (Depression), sleep disturbance and suicidal ideas. The patient is not nervous/anxious.     Vital Signs: BP 122/76   Pulse 64   Temp 97.8 F (36.6 C)   Resp 16   Ht '5\' 8"'$  (1.727 m)   Wt 183 lb 3.2 oz (83.1 kg)   SpO2 98%   BMI 27.86 kg/m    Physical Exam Vitals and nursing note reviewed.  Constitutional:      General: He is not in acute distress.    Appearance: Normal appearance. He is not ill-appearing.  HENT:     Head: Normocephalic and atraumatic.  Eyes:     Pupils: Pupils are equal, round, and reactive to light.  Cardiovascular:     Rate and Rhythm: Normal rate and regular rhythm.  Pulmonary:     Effort: Pulmonary effort is normal. No respiratory distress.     Breath sounds: Normal breath sounds. No wheezing.  Musculoskeletal:        General: Tenderness present.      Comments: Tenderness along left side of chest wall where known rib fractures  Skin:    General: Skin is warm and dry.  Neurological:     Mental Status: He is alert and oriented to person, place, and time.  Psychiatric:        Mood and Affect: Mood normal.        Behavior: Behavior normal.        Assessment/Plan: 1. Abnormal chest x-ray Due to pleuroparenchymal thickening in right apex on chest xray will order CT chest for further evaluation - CT Chest Wo Contrast; Future  2. Closed fracture of multiple ribs of left side with routine healing, subsequent encounter May continue to  take ibuprofen/tylenol as needed and should contact office for any new or worsening symptoms   General Counseling: drae mitzel understanding of the findings of todays visit and agrees with plan of treatment. I have discussed any further diagnostic evaluation that may be needed or ordered today. We also reviewed his medications today. he has been encouraged to call the office with any questions or concerns that should arise related to todays visit.    Orders Placed This Encounter  Procedures   CT Chest Wo Contrast    No orders of the defined types were placed in this encounter.   This patient was seen by Drema Dallas, PA-C in collaboration with Dr. Clayborn Bigness as a part of collaborative care agreement.   Total time spent:30 Minutes Time spent includes review of chart, medications, test results, and follow up plan with the patient.      Dr Lavera Guise Internal medicine

## 2022-06-20 ENCOUNTER — Ambulatory Visit: Admission: RE | Admit: 2022-06-20 | Payer: Medicare Other | Source: Ambulatory Visit

## 2022-06-29 ENCOUNTER — Ambulatory Visit: Payer: Medicare Other | Admitting: Nurse Practitioner

## 2022-07-01 ENCOUNTER — Ambulatory Visit
Admission: RE | Admit: 2022-07-01 | Discharge: 2022-07-01 | Disposition: A | Discharge status: Home / Self Care | Payer: Medicare Other | Source: Ambulatory Visit | Attending: Physician Assistant | Admitting: Physician Assistant

## 2022-07-01 DIAGNOSIS — J479 Bronchiectasis, uncomplicated: Secondary | ICD-10-CM | POA: Diagnosis not present

## 2022-07-01 DIAGNOSIS — R911 Solitary pulmonary nodule: Secondary | ICD-10-CM | POA: Diagnosis not present

## 2022-07-01 DIAGNOSIS — R9389 Abnormal findings on diagnostic imaging of other specified body structures: Secondary | ICD-10-CM | POA: Insufficient documentation

## 2022-07-07 DIAGNOSIS — I10 Essential (primary) hypertension: Secondary | ICD-10-CM | POA: Diagnosis not present

## 2022-07-07 DIAGNOSIS — Z95818 Presence of other cardiac implants and grafts: Secondary | ICD-10-CM | POA: Diagnosis not present

## 2022-07-07 DIAGNOSIS — I4819 Other persistent atrial fibrillation: Secondary | ICD-10-CM | POA: Diagnosis not present

## 2022-07-07 DIAGNOSIS — I712 Thoracic aortic aneurysm, without rupture, unspecified: Secondary | ICD-10-CM | POA: Diagnosis not present

## 2022-07-07 DIAGNOSIS — I25708 Atherosclerosis of coronary artery bypass graft(s), unspecified, with other forms of angina pectoris: Secondary | ICD-10-CM | POA: Diagnosis not present

## 2022-07-07 DIAGNOSIS — E782 Mixed hyperlipidemia: Secondary | ICD-10-CM | POA: Diagnosis not present

## 2022-07-08 ENCOUNTER — Encounter: Payer: Self-pay | Admitting: Nurse Practitioner

## 2022-07-08 ENCOUNTER — Ambulatory Visit (INDEPENDENT_AMBULATORY_CARE_PROVIDER_SITE_OTHER): Payer: Medicare Other | Admitting: Nurse Practitioner

## 2022-07-08 VITALS — BP 125/71 | HR 64 | Temp 96.8°F | Resp 16 | Ht 68.0 in | Wt 187.0 lb

## 2022-07-08 DIAGNOSIS — I719 Aortic aneurysm of unspecified site, without rupture: Secondary | ICD-10-CM | POA: Diagnosis not present

## 2022-07-08 DIAGNOSIS — J479 Bronchiectasis, uncomplicated: Secondary | ICD-10-CM | POA: Diagnosis not present

## 2022-07-08 DIAGNOSIS — I700 Atherosclerosis of aorta: Secondary | ICD-10-CM | POA: Diagnosis not present

## 2022-07-08 NOTE — Progress Notes (Signed)
[facility] [place], [place] [address]  Internal MEDICINE  Office Visit Note  Patient Name: James Mathews  [id number]  [id number]  Date of Service: 07/08/2022  Chief Complaint  Patient presents with   Follow-up    Follow up review ct   Hyperlipidemia   Hypertension    HPI James Mathews presents for a follow up visit for CT scan results. CT chest results as per radiologist impressions: -- 1. 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 This has not appreciably changed in appearance dating back to 2008. 2. 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 Recommend semi-annual imaging followup by CTA or MRA and referral to cardiothoracic surgery if not already obtained. This recommendation follows 2010 ACCF/AHA/AATS/ACR/ASA/SCA/SCAI/SIR/STS/SVM Guidelines for the Diagnosis and Management of Patients With Thoracic Aortic Disease. Circulation. 2010; 121: R427-C623. Aortic aneurysm NOS (ICD10-I71.9) 3. Aortic and coronary artery atherosclerosis (ICD10-I70.0).     Current Medication: Outpatient Encounter Medications as of 07/08/2022  Medication Sig   albuterol (VENTOLIN HFA) 108 (90 Base) MCG/ACT inhaler INHALE 2 PUFFS INTO THE LUNGS EVERY 6 HOURS AS NEEDED FOR WHEEZING OR SHORTNESS OF BREATH   amLODipine (NORVASC) 10 MG tablet Take 10 mg by mouth daily.   ASPIRIN 81 PO Take 81 mg by mouth. 1 daily   atorvastatin (LIPITOR) 20 MG tablet Take 20 mg by mouth daily.   Calcium-Magnesium-Vitamin D 400-166.7-133.3 MG-MG-UNIT TABS Take by mouth.   fluticasone (FLONASE) 50 MCG/ACT nasal spray SHAKE LIQUID AND USE 2 SPRAYS IN EACH NOSTRIL DAILY   pantoprazole (PROTONIX) 40 MG tablet TAKE 1 TABLET BY MOUTH DAILY   vitamin C (ASCORBIC ACID) 500 MG tablet Take 500 mg by mouth daily.   vitamin E 1000 UNIT capsule Take 1,000 Units by mouth daily.   [DISCONTINUED] clopidogrel (PLAVIX) 75 MG tablet  Take 75 mg by mouth daily.   [DISCONTINUED] montelukast (SINGULAIR) 10 MG tablet TAKE 1 TABLET BY MOUTH DAILY FOR CHRONIC ALLERGIC RHINITIS/ASTHMA   [DISCONTINUED] potassium chloride (KLOR-CON) 10 MEQ tablet TAKE 1 TABLET BY MOUTH DAILY   No facility-administered encounter medications on file as of 07/08/2022.    Surgical History: Past Surgical History:  Procedure Laterality Date   cartaract surgery both eye Bilateral    CORONARY ARTERY BYPASS GRAFT Bilateral    CYSTOSCOPY     HERNIA REPAIR     knee replacment Bilateral    LEFT ATRIAL APPENDAGE OCCLUSION  11/10/2021   retina surgery on right eye  Right 05/16/2017   TONSILLECTOMY Bilateral     Medical History: Past Medical History:  Diagnosis Date   Asthma    Atrial fibrillation ([place])    Coronary artery disease    Hyperlipidemia    Hypertension     Family History: Family History  Problem Relation Age of Onset   Diabetes Mother    Bowel Disease Father    Alcohol abuse Father     Social History   Socioeconomic History   Marital status: Married    Spouse name: Not on file   Number of children: Not on file   Years of education: Not on file   Highest education level: Not on file  Occupational History   Not on file  Tobacco Use   Smoking status: Former   Smokeless tobacco: Never  Substance and Sexual Activity   Alcohol use: No   Drug use: No   Sexual activity: Not on file  Other Topics Concern  Not on file  Social History Narrative   Not on file   Social Determinants of Health   Financial Resource Strain: Low Risk  (03/23/2021)   Overall Financial Resource Strain (CARDIA)    Difficulty of Paying Living Expenses: Not very hard  Food Insecurity: Not on file  Transportation Needs: Not on file  Physical Activity: Not on file  Stress: Not on file  Social Connections: Not on file  Intimate Partner Violence: Not on file      Review of Systems  Constitutional:  Negative for chills, fatigue and unexpected  weight change.  HENT:  Negative for congestion, rhinorrhea, sneezing and sore throat.   Eyes:  Negative for redness.  Respiratory:  Negative for cough, chest tightness, shortness of breath and wheezing.   Cardiovascular:  Negative for chest pain and palpitations.  Gastrointestinal:  Negative for abdominal pain, constipation, diarrhea, nausea and vomiting.  Genitourinary:  Negative for dysuria and frequency.  Musculoskeletal:  Positive for arthralgias. Negative for back pain, joint swelling and neck pain.  Skin:  Negative for rash.  Neurological: Negative.  Negative for tremors and numbness.  Hematological:  Negative for adenopathy. Does not bruise/bleed easily.  Psychiatric/Behavioral:  Negative for behavioral problems (Depression), sleep disturbance and suicidal ideas. The patient is not nervous/anxious.     Vital Signs: BP 125/71   Pulse 64   Temp (!) 96.8 F (36 C)   Resp 16   Ht '5\' 8"'$  (1.727 m)   Wt 187 lb (84.8 kg)   SpO2 98%   BMI 28.43 kg/m    Physical Exam Vitals reviewed.  Constitutional:      General: He is not in acute distress.    Appearance: Normal appearance. He is not ill-appearing.  HENT:     Head: Normocephalic and atraumatic.  Eyes:     Pupils: Pupils are equal, round, and reactive to light.  Cardiovascular:     Rate and Rhythm: Normal rate and regular rhythm.  Pulmonary:     Effort: Pulmonary effort is normal. No respiratory distress.  Neurological:     Mental Status: He is alert and oriented to person, place, and time.  Psychiatric:        Mood and Affect: Mood normal.        Behavior: Behavior normal.        Assessment/Plan: 1. Aortic atherosclerosis (HCC) stable  2. Aortic aneurysm without rupture, unspecified portion of aorta (HCC) Stable, sees cardiothoracic surgery  3. 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 I have discussed any further diagnostic evaluation that may be needed or ordered today. We also reviewed his medications today. he has been encouraged to call the office with any questions or concerns that should arise related to todays visit.    No orders of the defined types were placed in this encounter.   No orders of the defined types were placed in this encounter.   Return for previously scheduled, F/U, James Mathews PCP.   Total time spent:20 Minutes Time spent includes review of chart, medications, test results, and follow up plan with the patient.   [place] Controlled Substance Database was reviewed by me.  This patient was seen by Jonetta Osgood, FNP-C in collaboration with Dr. Clayborn Bigness as a part of collaborative care agreement.   Selen Smucker R. Valetta Fuller, MSN, FNP-C Internal medicine

## 2022-07-22 ENCOUNTER — Other Ambulatory Visit: Payer: Self-pay | Admitting: Nurse Practitioner

## 2022-07-22 DIAGNOSIS — J3089 Other allergic rhinitis: Secondary | ICD-10-CM

## 2022-07-26 ENCOUNTER — Other Ambulatory Visit: Payer: Self-pay | Admitting: Nurse Practitioner

## 2022-07-26 DIAGNOSIS — E876 Hypokalemia: Secondary | ICD-10-CM

## 2022-08-01 DIAGNOSIS — I77810 Thoracic aortic ectasia: Secondary | ICD-10-CM | POA: Diagnosis not present

## 2022-08-01 DIAGNOSIS — Z87891 Personal history of nicotine dependence: Secondary | ICD-10-CM | POA: Diagnosis not present

## 2022-08-01 DIAGNOSIS — I351 Nonrheumatic aortic (valve) insufficiency: Secondary | ICD-10-CM | POA: Diagnosis not present

## 2022-08-01 DIAGNOSIS — I083 Combined rheumatic disorders of mitral, aortic and tricuspid valves: Secondary | ICD-10-CM | POA: Diagnosis not present

## 2022-08-01 DIAGNOSIS — I728 Aneurysm of other specified arteries: Secondary | ICD-10-CM | POA: Diagnosis not present

## 2022-08-01 DIAGNOSIS — Z951 Presence of aortocoronary bypass graft: Secondary | ICD-10-CM | POA: Diagnosis not present

## 2022-08-01 DIAGNOSIS — I77811 Abdominal aortic ectasia: Secondary | ICD-10-CM | POA: Diagnosis not present

## 2022-08-01 DIAGNOSIS — I7143 Infrarenal abdominal aortic aneurysm, without rupture: Secondary | ICD-10-CM | POA: Diagnosis not present

## 2022-08-01 DIAGNOSIS — I7772 Dissection of iliac artery: Secondary | ICD-10-CM | POA: Diagnosis not present

## 2022-08-01 DIAGNOSIS — I7100 Dissection of unspecified site of aorta: Secondary | ICD-10-CM | POA: Diagnosis not present

## 2022-08-01 DIAGNOSIS — I7123 Aneurysm of the descending thoracic aorta, without rupture: Secondary | ICD-10-CM | POA: Diagnosis not present

## 2022-08-26 ENCOUNTER — Encounter: Payer: Self-pay | Admitting: Nurse Practitioner

## 2022-08-26 ENCOUNTER — Other Ambulatory Visit: Payer: Self-pay

## 2022-08-26 ENCOUNTER — Telehealth: Payer: Self-pay

## 2022-08-26 MED ORDER — LEVOFLOXACIN 500 MG PO TABS: 500.0000 mg | ORAL_TABLET | Freq: Every day | ORAL | 0 refills | Status: DC

## 2022-08-26 MED ORDER — PREDNISONE 10 MG PO TABS: ORAL_TABLET | ORAL | 0 refills | Status: DC

## 2022-08-26 NOTE — Telephone Encounter (Signed)
Pt send mychart message  head is very stuffy with a constant headache cough and SOB and sinus infection Temp is 98.1 as per dr Humphrey Rolls send levaquin  and prednisone

## 2022-09-01 ENCOUNTER — Ambulatory Visit
Admission: RE | Admit: 2022-09-01 | Discharge: 2022-09-01 | Disposition: A | Discharge status: Home / Self Care | Payer: Medicare Other | Source: Ambulatory Visit | Attending: Nurse Practitioner | Admitting: Nurse Practitioner

## 2022-09-01 ENCOUNTER — Encounter: Payer: Self-pay | Admitting: Nurse Practitioner

## 2022-09-01 ENCOUNTER — Ambulatory Visit (INDEPENDENT_AMBULATORY_CARE_PROVIDER_SITE_OTHER): Payer: Medicare Other | Admitting: Nurse Practitioner

## 2022-09-01 ENCOUNTER — Ambulatory Visit
Admission: RE | Admit: 2022-09-01 | Discharge: 2022-09-01 | Disposition: A | Discharge status: Home / Self Care | Payer: Medicare Other | Attending: Nurse Practitioner | Admitting: Nurse Practitioner

## 2022-09-01 VITALS — BP 139/82 | HR 93 | Temp 97.7°F | Resp 16 | Ht 68.0 in | Wt 188.4 lb

## 2022-09-01 DIAGNOSIS — Z760 Encounter for issue of repeat prescription: Secondary | ICD-10-CM

## 2022-09-01 DIAGNOSIS — R058 Other specified cough: Secondary | ICD-10-CM

## 2022-09-01 DIAGNOSIS — J189 Pneumonia, unspecified organism: Secondary | ICD-10-CM | POA: Diagnosis not present

## 2022-09-01 DIAGNOSIS — I10 Essential (primary) hypertension: Secondary | ICD-10-CM | POA: Diagnosis not present

## 2022-09-01 DIAGNOSIS — R52 Pain, unspecified: Secondary | ICD-10-CM | POA: Diagnosis not present

## 2022-09-01 DIAGNOSIS — R059 Cough, unspecified: Secondary | ICD-10-CM | POA: Diagnosis not present

## 2022-09-01 MED ORDER — PANTOPRAZOLE SODIUM 40 MG PO TBEC: 40.0000 mg | DELAYED_RELEASE_TABLET | Freq: Every day | ORAL | 1 refills | Status: DC

## 2022-09-01 MED ORDER — AMOXICILLIN-POT CLAVULANATE 875-125 MG PO TABS: 1.0000 | ORAL_TABLET | Freq: Two times a day (BID) | ORAL | 0 refills | Status: AC

## 2022-09-01 MED ORDER — AZITHROMYCIN 250 MG PO TABS: 250.0000 mg | ORAL_TABLET | Freq: Every day | ORAL | 0 refills | Status: AC

## 2022-09-01 NOTE — Progress Notes (Signed)
[facility] [place], [place] [address]  Internal MEDICINE  Office Visit Note  Patient Name: James Mathews  [id number]  [id number]  Date of Service: 09/01/2022  Chief Complaint  Patient presents with   Follow-up   Hypertension   Hyperlipidemia    HPI James Mathews presents for a follow up visit for hypertension and hyperlipidemia.  Hypertension -- stable on current medications High cholesterol -- takes atorvastatin GERD -- refills needed Sinus infection is not better, finished levofloxacin and almost done with prednisone, congestion in chest, chest tightness, wheezing and SOB with painful cough   Current Medication: Outpatient Encounter Medications as of 09/01/2022  Medication Sig   albuterol (VENTOLIN HFA) 108 (90 Base) MCG/ACT inhaler INHALE 2 PUFFS INTO THE LUNGS EVERY 6 HOURS AS NEEDED FOR WHEEZING OR SHORTNESS OF BREATH   amLODipine (NORVASC) 10 MG tablet Take 10 mg by mouth daily.   amoxicillin-clavulanate (AUGMENTIN) 875-125 MG tablet Take 1 tablet by mouth 2 (two) times daily for 7 days.   ASPIRIN 81 PO Take 81 mg by mouth. 1 daily   atorvastatin (LIPITOR) 20 MG tablet Take 20 mg by mouth daily.   azithromycin (ZITHROMAX) 250 MG tablet Take 1 tablet (250 mg total) by mouth daily for 10 days.   Calcium-Magnesium-Vitamin D 400-166.7-133.3 MG-MG-UNIT TABS Take by mouth.   fluticasone (FLONASE) 50 MCG/ACT nasal spray SHAKE LIQUID AND USE 2 SPRAYS IN EACH NOSTRIL DAILY   levofloxacin (LEVAQUIN) 500 MG tablet Take 1 tablet (500 mg total) by mouth daily.   montelukast (SINGULAIR) 10 MG tablet TAKE 1 TABLET BY MOUTH DAILY FOR CHRONIC ALLERGIC RHINITIS/ASTHMA   potassium chloride (KLOR-CON) 10 MEQ tablet TAKE 1 TABLET BY MOUTH DAILY   predniSONE (DELTASONE) 10 MG tablet Take 1 tab po 3 x day for 3 days then take 1 tab po 2 x a day for 3 days and then take 1 tab po daily for 3 days   vitamin C (ASCORBIC ACID) 500 MG tablet Take 500 mg by mouth daily.    vitamin E 1000 UNIT capsule Take 1,000 Units by mouth daily.   [DISCONTINUED] pantoprazole (PROTONIX) 40 MG tablet TAKE 1 TABLET BY MOUTH DAILY   pantoprazole (PROTONIX) 40 MG tablet Take 1 tablet (40 mg total) by mouth daily.   No facility-administered encounter medications on file as of 09/01/2022.    Surgical History: Past Surgical History:  Procedure Laterality Date   cartaract surgery both eye Bilateral    CORONARY ARTERY BYPASS GRAFT Bilateral    CYSTOSCOPY     HERNIA REPAIR     knee replacment Bilateral    LEFT ATRIAL APPENDAGE OCCLUSION  11/10/2021   retina surgery on right eye  Right 05/16/2017   TONSILLECTOMY Bilateral     Medical History: Past Medical History:  Diagnosis Date   Asthma    Atrial fibrillation ([place])    Coronary artery disease    Hyperlipidemia    Hypertension     Family History: Family History  Problem Relation Age of Onset   Diabetes Mother    Bowel Disease Father    Alcohol abuse Father     Social History   Socioeconomic History   Marital status: Married    Spouse name: Not on file   Number of children: Not on file   Years of education: Not on file   Highest education level: Not on file  Occupational History   Not on file  Tobacco Use   Smoking status: Former   Smokeless  tobacco: Never  Substance and Sexual Activity   Alcohol use: No   Drug use: No   Sexual activity: Not on file  Other Topics Concern   Not on file  Social History Narrative   Not on file   Social Determinants of Health   Financial Resource Strain: Low Risk  (03/23/2021)   Overall Financial Resource Strain (CARDIA)    Difficulty of Paying Living Expenses: Not very hard  Food Insecurity: Not on file  Transportation Needs: Not on file  Physical Activity: Not on file  Stress: Not on file  Social Connections: Not on file  Intimate Partner Violence: Not on file      Review of Systems  Constitutional:  Positive for fatigue. Negative for chills and fever.   HENT:  Positive for congestion, postnasal drip, rhinorrhea, sinus pressure and sinus pain. Negative for sore throat.   Respiratory:  Positive for cough, chest tightness, shortness of breath and wheezing.   Cardiovascular: Negative.  Negative for chest pain and palpitations.  Gastrointestinal:  Negative for diarrhea, nausea and vomiting.  Musculoskeletal:  Negative for myalgias.  Neurological:  Positive for dizziness and headaches.    Vital Signs: BP 139/82   Pulse 93   Temp 97.7 F (36.5 C)   Resp 16   Ht '5\' 8"'$  (1.727 m)   Wt 188 lb 6.4 oz (85.5 kg)   SpO2 98%   BMI 28.65 kg/m    Physical Exam Vitals reviewed.  Constitutional:      General: He is awake.     Appearance: He is ill-appearing.  HENT:     Head: Normocephalic and atraumatic.     Nose: Mucosal edema, congestion and rhinorrhea present.     Right Turbinates: Swollen and pale.     Left Turbinates: Swollen and pale.     Mouth/Throat:     Pharynx: Posterior oropharyngeal erythema present.  Eyes:     Pupils: Pupils are equal, round, and reactive to light.  Cardiovascular:     Rate and Rhythm: Normal rate and regular rhythm.  Pulmonary:     Effort: No accessory muscle usage or respiratory distress.     Breath sounds: Normal air entry. Examination of the right-upper field reveals rales. Examination of the left-upper field reveals rales. Examination of the right-middle field reveals rales. Examination of the left-middle field reveals rales. Examination of the right-lower field reveals rales. Examination of the left-lower field reveals rales. Rales present.  Neurological:     Mental Status: He is alert and oriented to person, place, and time.  Psychiatric:        Mood and Affect: Mood normal.        Behavior: Behavior normal. Behavior is cooperative.        Assessment/Plan: 1. Pneumonia of both lower lobes due to infectious organism Dual therapy with augmentin and azithromycin prescribed. Chest xray ordered -  amoxicillin-clavulanate (AUGMENTIN) 875-125 MG tablet; Take 1 tablet by mouth 2 (two) times daily for 7 days.  Dispense: 14 tablet; Refill: 0 - azithromycin (ZITHROMAX) 250 MG tablet; Take 1 tablet (250 mg total) by mouth daily for 10 days.  Dispense: 10 tablet; Refill: 0 - DG Chest 2 View; Future  2. Painful cough See problem #1 - amoxicillin-clavulanate (AUGMENTIN) 875-125 MG tablet; Take 1 tablet by mouth 2 (two) times daily for 7 days.  Dispense: 14 tablet; Refill: 0 - azithromycin (ZITHROMAX) 250 MG tablet; Take 1 tablet (250 mg total) by mouth daily for 10 days.  Dispense: 10 tablet;  Refill: 0 - DG Chest 2 View; Future  3. Essential hypertension Stable, continue current medications as prescribed  4. Medication refill - pantoprazole (PROTONIX) 40 MG tablet; Take 1 tablet (40 mg total) by mouth daily.  Dispense: 90 tablet; Refill: 1   General Counseling: naethan bracewell understanding of the findings of todays visit and agrees with plan of treatment. I have discussed any further diagnostic evaluation that may be needed or ordered today. We also reviewed his medications today. he has been encouraged to call the office with any questions or concerns that should arise related to todays visit.    Orders Placed This Encounter  Procedures   DG Chest 2 View    Meds ordered this encounter  Medications   pantoprazole (PROTONIX) 40 MG tablet    Sig: Take 1 tablet (40 mg total) by mouth daily.    Dispense:  90 tablet    Refill:  1   amoxicillin-clavulanate (AUGMENTIN) 875-125 MG tablet    Sig: Take 1 tablet by mouth 2 (two) times daily for 7 days.    Dispense:  14 tablet    Refill:  0   azithromycin (ZITHROMAX) 250 MG tablet    Sig: Take 1 tablet (250 mg total) by mouth daily for 10 days.    Dispense:  10 tablet    Refill:  0    Return in about 6 months (around 03/03/2023) for previously scheduled, CPE, Bobbyjo Marulanda PCP.   Total time spent:30 Minutes Time spent includes review of  chart, medications, test results, and follow up plan with the patient.   [place] Controlled Substance Database was reviewed by me.  This patient was seen by Jonetta Osgood, FNP-C in collaboration with Dr. Clayborn Bigness as a part of collaborative care agreement.   Nailea Whitehorn R. Valetta Fuller, MSN, FNP-C Internal medicine

## 2022-09-14 ENCOUNTER — Encounter: Payer: Self-pay | Admitting: Nurse Practitioner

## 2022-12-19 ENCOUNTER — Other Ambulatory Visit
Admission: RE | Admit: 2022-12-19 | Discharge: 2022-12-19 | Disposition: A | Discharge status: Home / Self Care | Payer: Medicare Other | Source: Ambulatory Visit | Attending: Internal Medicine | Admitting: Internal Medicine

## 2022-12-19 DIAGNOSIS — R0609 Other forms of dyspnea: Secondary | ICD-10-CM | POA: Diagnosis not present

## 2022-12-19 DIAGNOSIS — J209 Acute bronchitis, unspecified: Secondary | ICD-10-CM | POA: Diagnosis not present

## 2022-12-19 DIAGNOSIS — R0600 Dyspnea, unspecified: Secondary | ICD-10-CM | POA: Diagnosis not present

## 2022-12-19 DIAGNOSIS — M546 Pain in thoracic spine: Secondary | ICD-10-CM | POA: Diagnosis not present

## 2022-12-19 DIAGNOSIS — R079 Chest pain, unspecified: Secondary | ICD-10-CM | POA: Diagnosis not present

## 2022-12-19 LAB — D-DIMER, QUANTITATIVE: D-Dimer, Quant: 0.37 ug/mL-FEU (ref 0.00–0.50)

## 2022-12-22 DIAGNOSIS — L298 Other pruritus: Secondary | ICD-10-CM | POA: Diagnosis not present

## 2022-12-22 DIAGNOSIS — L538 Other specified erythematous conditions: Secondary | ICD-10-CM | POA: Diagnosis not present

## 2022-12-22 DIAGNOSIS — D225 Melanocytic nevi of trunk: Secondary | ICD-10-CM | POA: Diagnosis not present

## 2022-12-22 DIAGNOSIS — Z85828 Personal history of other malignant neoplasm of skin: Secondary | ICD-10-CM | POA: Diagnosis not present

## 2022-12-22 DIAGNOSIS — D2272 Melanocytic nevi of left lower limb, including hip: Secondary | ICD-10-CM | POA: Diagnosis not present

## 2022-12-22 DIAGNOSIS — D2261 Melanocytic nevi of right upper limb, including shoulder: Secondary | ICD-10-CM | POA: Diagnosis not present

## 2022-12-22 DIAGNOSIS — L570 Actinic keratosis: Secondary | ICD-10-CM | POA: Diagnosis not present

## 2022-12-22 DIAGNOSIS — L820 Inflamed seborrheic keratosis: Secondary | ICD-10-CM | POA: Diagnosis not present

## 2023-01-02 DIAGNOSIS — E782 Mixed hyperlipidemia: Secondary | ICD-10-CM | POA: Diagnosis not present

## 2023-01-02 DIAGNOSIS — I714 Abdominal aortic aneurysm, without rupture, unspecified: Secondary | ICD-10-CM | POA: Diagnosis not present

## 2023-01-02 DIAGNOSIS — I7123 Aneurysm of the descending thoracic aorta, without rupture: Secondary | ICD-10-CM | POA: Diagnosis not present

## 2023-01-02 DIAGNOSIS — I10 Essential (primary) hypertension: Secondary | ICD-10-CM | POA: Diagnosis not present

## 2023-01-02 DIAGNOSIS — I493 Ventricular premature depolarization: Secondary | ICD-10-CM | POA: Diagnosis not present

## 2023-01-02 DIAGNOSIS — I25119 Atherosclerotic heart disease of native coronary artery with unspecified angina pectoris: Secondary | ICD-10-CM | POA: Diagnosis not present

## 2023-01-02 DIAGNOSIS — I7100 Dissection of unspecified site of aorta: Secondary | ICD-10-CM | POA: Diagnosis not present

## 2023-01-02 DIAGNOSIS — Z95818 Presence of other cardiac implants and grafts: Secondary | ICD-10-CM | POA: Diagnosis not present

## 2023-01-02 DIAGNOSIS — I4819 Other persistent atrial fibrillation: Secondary | ICD-10-CM | POA: Diagnosis not present

## 2023-01-09 DIAGNOSIS — M180 Bilateral primary osteoarthritis of first carpometacarpal joints: Secondary | ICD-10-CM | POA: Diagnosis not present

## 2023-01-21 ENCOUNTER — Other Ambulatory Visit: Payer: Self-pay | Admitting: Nurse Practitioner

## 2023-01-21 DIAGNOSIS — E876 Hypokalemia: Secondary | ICD-10-CM

## 2023-02-06 ENCOUNTER — Encounter: Payer: Self-pay | Admitting: Nurse Practitioner

## 2023-02-07 ENCOUNTER — Ambulatory Visit
Admission: RE | Admit: 2023-02-07 | Discharge: 2023-02-07 | Disposition: A | Discharge status: Home / Self Care | Payer: Medicare Other | Source: Ambulatory Visit | Attending: Nurse Practitioner | Admitting: Nurse Practitioner

## 2023-02-07 ENCOUNTER — Ambulatory Visit (INDEPENDENT_AMBULATORY_CARE_PROVIDER_SITE_OTHER): Payer: Medicare Other | Admitting: Nurse Practitioner

## 2023-02-07 ENCOUNTER — Encounter: Payer: Self-pay | Admitting: Nurse Practitioner

## 2023-02-07 ENCOUNTER — Ambulatory Visit
Admission: RE | Admit: 2023-02-07 | Discharge: 2023-02-07 | Disposition: A | Discharge status: Home / Self Care | Payer: Medicare Other | Attending: Nurse Practitioner | Admitting: Nurse Practitioner

## 2023-02-07 VITALS — BP 120/70 | HR 88 | Resp 16 | Ht 68.0 in | Wt 196.4 lb

## 2023-02-07 DIAGNOSIS — R053 Chronic cough: Secondary | ICD-10-CM | POA: Insufficient documentation

## 2023-02-07 DIAGNOSIS — R0689 Other abnormalities of breathing: Secondary | ICD-10-CM

## 2023-02-07 DIAGNOSIS — R059 Cough, unspecified: Secondary | ICD-10-CM | POA: Diagnosis not present

## 2023-02-07 DIAGNOSIS — J209 Acute bronchitis, unspecified: Secondary | ICD-10-CM

## 2023-02-07 MED ORDER — PREDNISONE 10 MG (21) PO TBPK
ORAL_TABLET | ORAL | 0 refills | Status: DC
Start: 2023-02-07 — End: 2023-02-15

## 2023-02-07 MED ORDER — AMOXICILLIN-POT CLAVULANATE 875-125 MG PO TABS
1.0000 | ORAL_TABLET | Freq: Two times a day (BID) | ORAL | 0 refills | Status: AC
Start: 2023-02-07 — End: 2023-02-17

## 2023-02-07 NOTE — Progress Notes (Signed)
[facility] [address] [place], [place] [address]  Internal MEDICINE  Office Visit Note  Patient Name: James Mathews  [id number]  [id number]  Date of Service: 02/07/2023  Chief Complaint  Patient presents with   Acute Visit    Difficulty breathing      HPI James Mathews presents for an acute sick visit for SOB and cough 12/19/22 -- went to urgent care -- xray done, normal no pleurisy or collapsed lung. Labs were normal. Treated for bronchitis with zpak and prednisone.  -started feeling better and then started getting worse over the next couple weeks.  --reports cough, SOB, chest tightness, wheezing, increased sinus drainage, sneezing, nasal congestion. Has had the cough for more than 3 weeks Denies any chest wall pain, sore throat or ear pain.  CBC showed mild anemia.      Current Medication:  Outpatient Encounter Medications as of 02/07/2023  Medication Sig   amoxicillin-clavulanate (AUGMENTIN) 875-125 MG tablet Take 1 tablet by mouth 2 (two) times daily for 10 days. Take with food   predniSONE (STERAPRED UNI-PAK 21 TAB) 10 MG (21) TBPK tablet Use as directed for 6 days   albuterol (VENTOLIN HFA) 108 (90 Base) MCG/ACT inhaler INHALE 2 PUFFS INTO THE LUNGS EVERY 6 HOURS AS NEEDED FOR WHEEZING OR SHORTNESS OF BREATH   amLODipine (NORVASC) 10 MG tablet Take 10 mg by mouth daily.   ASPIRIN 81 PO Take 81 mg by mouth. 1 daily   atorvastatin (LIPITOR) 20 MG tablet Take 20 mg by mouth daily.   Calcium-Magnesium-Vitamin D 400-166.7-133.3 MG-MG-UNIT TABS Take by mouth.   fluticasone (FLONASE) 50 MCG/ACT nasal spray SHAKE LIQUID AND USE 2 SPRAYS IN EACH NOSTRIL DAILY   levofloxacin (LEVAQUIN) 500 MG tablet Take 1 tablet (500 mg total) by mouth daily.   montelukast (SINGULAIR) 10 MG tablet TAKE 1 TABLET BY MOUTH DAILY FOR CHRONIC ALLERGIC RHINITIS/ASTHMA   pantoprazole (PROTONIX) 40 MG tablet Take 1 tablet (40 mg total) by mouth daily.   potassium chloride (KLOR-CON) 10 MEQ  tablet TAKE 1 TABLET BY MOUTH DAILY   vitamin C (ASCORBIC ACID) 500 MG tablet Take 500 mg by mouth daily.   vitamin E 1000 UNIT capsule Take 1,000 Units by mouth daily.   [DISCONTINUED] predniSONE (DELTASONE) 10 MG tablet Take 1 tab po 3 x day for 3 days then take 1 tab po 2 x a day for 3 days and then take 1 tab po daily for 3 days   No facility-administered encounter medications on file as of 02/07/2023.      Medical History: Past Medical History:  Diagnosis Date   Asthma    Atrial fibrillation (HCC)    Coronary artery disease    Hyperlipidemia    Hypertension      Vital Signs: BP 120/70   Pulse 88   Resp 16   Ht 5\' 8"  (1.727 m)   Wt 196 lb 6.4 oz (89.1 kg)   SpO2 96%   BMI 29.86 kg/m    Review of Systems  Constitutional:  Positive for fatigue. Negative for chills and fever.  HENT:  Positive for congestion, postnasal drip, sneezing and sore throat. Negative for sinus pressure and sinus pain.   Respiratory:  Positive for cough, chest tightness, shortness of breath and wheezing.   Cardiovascular: Negative.  Negative for chest pain and palpitations.  Gastrointestinal: Negative.     Physical Exam Vitals reviewed.  Constitutional:      General: He is not in acute distress.  Appearance: Normal appearance. He is ill-appearing.  HENT:     Head: Normocephalic and atraumatic.  Eyes:     Pupils: Pupils are equal, round, and reactive to light.  Cardiovascular:     Rate and Rhythm: Normal rate and regular rhythm.  Pulmonary:     Effort: Accessory muscle usage present. No respiratory distress.     Breath sounds: Normal air entry. Examination of the right-upper field reveals wheezing. Examination of the left-upper field reveals decreased breath sounds. Examination of the left-middle field reveals decreased breath sounds. Examination of the left-lower field reveals decreased breath sounds. Decreased breath sounds and wheezing present.  Neurological:     Mental Status: He is  alert and oriented to person, place, and time.  Psychiatric:        Mood and Affect: Mood normal.        Behavior: Behavior normal.       Assessment/Plan: 1. Acute bronchitis due to infection Prednisone taper and empiric antibiotic treatment prescribed.  - predniSONE (STERAPRED UNI-PAK 21 TAB) 10 MG (21) TBPK tablet; Use as directed for 6 days  Dispense: 21 tablet; Refill: 0 - amoxicillin-clavulanate (AUGMENTIN) 875-125 MG tablet; Take 1 tablet by mouth 2 (two) times daily for 10 days. Take with food  Dispense: 20 tablet; Refill: 0  2. Decreased breath sounds Chest xray ordered to rule out pneumonia or other abnormalities  - DG Chest 2 View; Future - predniSONE (STERAPRED UNI-PAK 21 TAB) 10 MG (21) TBPK tablet; Use as directed for 6 days  Dispense: 21 tablet; Refill: 0  3. Persistent cough for 3 weeks or longer Chest xray to assess for pneumonia or other abnormalities  - DG Chest 2 View; Future   General Counseling: eljay bethard understanding of the findings of todays visit and agrees with plan of treatment. I have discussed any further diagnostic evaluation that may be needed or ordered today. We also reviewed his medications today. he has been encouraged to call the office with any questions or concerns that should arise related to todays visit.    Counseling:    Orders Placed This Encounter  Procedures   DG Chest 2 View    Meds ordered this encounter  Medications   predniSONE (STERAPRED UNI-PAK 21 TAB) 10 MG (21) TBPK tablet    Sig: Use as directed for 6 days    Dispense:  21 tablet    Refill:  0    Fill today   amoxicillin-clavulanate (AUGMENTIN) 875-125 MG tablet    Sig: Take 1 tablet by mouth 2 (two) times daily for 10 days. Take with food    Dispense:  20 tablet    Refill:  0    Fill today    Return for cancel 6/20 appt and schedule medicare wellness visit on a date after 03/05/23.   Controlled Substance Database was reviewed by me for overdose  risk score (ORS)  Time spent:30 Minutes Time spent with patient included reviewing progress notes, labs, imaging studies, and discussing plan for follow up.   This patient was seen by Sallyanne Kuster, FNP-C in collaboration with Dr. Beverely Risen as a part of collaborative care agreement.  Monte Zinni R. Tedd Sias, MSN, FNP-C Internal Medicine

## 2023-02-07 NOTE — Progress Notes (Signed)
No acute findings consistent with pneumonia, pleural effusion or pneumothorax. I will send additional antibiotic and prednisone for acute unresolved bronchitis for now.

## 2023-02-08 ENCOUNTER — Telehealth: Payer: Self-pay

## 2023-02-08 NOTE — Telephone Encounter (Signed)
-----   Message from Sallyanne Kuster, NP sent at 02/07/2023  4:28 PM EDT ----- No acute findings consistent with pneumonia, pleural effusion or pneumothorax. I will send additional antibiotic and prednisone for acute unresolved bronchitis for now.

## 2023-02-08 NOTE — Telephone Encounter (Signed)
Patient notified

## 2023-02-14 ENCOUNTER — Encounter: Payer: Self-pay | Admitting: Nurse Practitioner

## 2023-02-14 DIAGNOSIS — R0689 Other abnormalities of breathing: Secondary | ICD-10-CM

## 2023-02-14 DIAGNOSIS — I7121 Aneurysm of the ascending aorta, without rupture: Secondary | ICD-10-CM

## 2023-02-14 DIAGNOSIS — J209 Acute bronchitis, unspecified: Secondary | ICD-10-CM

## 2023-02-14 DIAGNOSIS — R053 Chronic cough: Secondary | ICD-10-CM

## 2023-02-14 DIAGNOSIS — J479 Bronchiectasis, uncomplicated: Secondary | ICD-10-CM

## 2023-02-15 MED ORDER — LEVOFLOXACIN 500 MG PO TABS
500.0000 mg | ORAL_TABLET | Freq: Every day | ORAL | 0 refills | Status: AC
Start: 2023-02-15 — End: 2023-02-22

## 2023-02-15 MED ORDER — PREDNISONE 10 MG (21) PO TBPK
ORAL_TABLET | ORAL | 0 refills | Status: DC
Start: 2023-02-15 — End: 2023-09-14

## 2023-02-21 ENCOUNTER — Ambulatory Visit
Admission: RE | Admit: 2023-02-21 | Discharge: 2023-02-21 | Disposition: A | Discharge status: Home / Self Care | Payer: Medicare Other | Source: Ambulatory Visit | Attending: Nurse Practitioner | Admitting: Nurse Practitioner

## 2023-02-21 DIAGNOSIS — R0602 Shortness of breath: Secondary | ICD-10-CM | POA: Diagnosis not present

## 2023-02-21 DIAGNOSIS — I7121 Aneurysm of the ascending aorta, without rupture: Secondary | ICD-10-CM | POA: Diagnosis not present

## 2023-02-21 MED ORDER — IOHEXOL 350 MG/ML SOLN
75.0000 mL | Freq: Once | INTRAVENOUS | Status: AC | PRN
  Administered 2023-02-21: 75 mL via INTRAVENOUS

## 2023-03-02 ENCOUNTER — Ambulatory Visit: Payer: Medicare Other | Admitting: Nurse Practitioner

## 2023-03-03 NOTE — Progress Notes (Signed)
Dilation of ascending aorta is stable, repeat CTA chest w/aorta in in 12 months.  No change in pulmonary nodules  No other changes.

## 2023-03-07 ENCOUNTER — Ambulatory Visit: Payer: Medicare Other | Admitting: Nurse Practitioner

## 2023-03-08 ENCOUNTER — Ambulatory Visit (INDEPENDENT_AMBULATORY_CARE_PROVIDER_SITE_OTHER): Payer: Medicare Other | Admitting: Internal Medicine

## 2023-03-08 DIAGNOSIS — J209 Acute bronchitis, unspecified: Secondary | ICD-10-CM

## 2023-03-08 DIAGNOSIS — R053 Chronic cough: Secondary | ICD-10-CM

## 2023-03-08 DIAGNOSIS — J479 Bronchiectasis, uncomplicated: Secondary | ICD-10-CM

## 2023-03-08 DIAGNOSIS — R0689 Other abnormalities of breathing: Secondary | ICD-10-CM | POA: Diagnosis not present

## 2023-03-09 NOTE — Procedures (Signed)
[facility] [address] [place], [address]    Complete Pulmonary Function Testing Interpretation:  FINDINGS:  The forced vital capacity is normal the FEV1 is 1.5 L which is 58% of predicted and is moderately decreased.  FEV1 FVC ratio is moderately decreased.  Postbronchodilator there is no significant change in the FEV1.  Total lung capacity is normal.  Residual volume is increased.  Residual volume from breast ratio is increased.  FRC is normal.  DLCO is within normal limits.  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

## 2023-03-13 LAB — PULMONARY FUNCTION TEST

## 2023-03-14 ENCOUNTER — Ambulatory Visit (INDEPENDENT_AMBULATORY_CARE_PROVIDER_SITE_OTHER): Payer: Medicare Other | Admitting: Nurse Practitioner

## 2023-03-14 ENCOUNTER — Encounter: Payer: Self-pay | Admitting: Nurse Practitioner

## 2023-03-14 VITALS — BP 126/80 | HR 71 | Temp 98.2°F | Resp 16 | Ht 68.0 in | Wt 191.0 lb

## 2023-03-14 DIAGNOSIS — J479 Bronchiectasis, uncomplicated: Secondary | ICD-10-CM | POA: Diagnosis not present

## 2023-03-14 DIAGNOSIS — Z0001 Encounter for general adult medical examination with abnormal findings: Secondary | ICD-10-CM | POA: Diagnosis not present

## 2023-03-14 DIAGNOSIS — J3089 Other allergic rhinitis: Secondary | ICD-10-CM

## 2023-03-14 DIAGNOSIS — E782 Mixed hyperlipidemia: Secondary | ICD-10-CM | POA: Diagnosis not present

## 2023-03-14 DIAGNOSIS — R053 Chronic cough: Secondary | ICD-10-CM

## 2023-03-14 DIAGNOSIS — Z125 Encounter for screening for malignant neoplasm of prostate: Secondary | ICD-10-CM

## 2023-03-14 DIAGNOSIS — I480 Paroxysmal atrial fibrillation: Secondary | ICD-10-CM

## 2023-03-14 NOTE — Progress Notes (Signed)
[facility] [address] [place], [place] [address]  Internal MEDICINE  Office Visit Note  Patient Name: James Mathews  [id number]  [id number]  Date of Service: 03/14/2023  Chief Complaint  Patient presents with   Hyperlipidemia   Hypertension   Medicare Wellness    HPI James Mathews presents for an annual well visit and physical exam.  Well-appearing 82 y.o. male with AFIB, CAD, PVD, hypertension, asthma, COPD, allergic rhinitis, GERD, B12 deficiency and high cholesterol Labs: due for routine labs  New or worsening pain: none Other concerns: persistent cough Sees cardiothoracic surgery at [facility] every year.  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       03/14/2023    2:34 PM 03/03/2022    2:07 PM 03/01/2021    1:56 PM  MMSE - Mini Mental State Exam  Orientation to time 5 5 5   Orientation to Place 5 5 5   Registration 3 3 3   Attention/ Calculation 5 5 5   Recall 3 3 3   Language- name 2 objects 2 2 2   Language- repeat 1 1 1   Language- follow 3 step command 3 3 3   Language- read & follow direction 1 1 1   Write a sentence 1 1 1   Copy design 1 1 1   Total score 30 30 30     Functional Status Survey: Is the patient deaf or have difficulty hearing?: No Does the patient have difficulty seeing, even when wearing glasses/contacts?: No Does the patient have difficulty concentrating, remembering, or making decisions?: No Does the patient have difficulty walking or climbing stairs?: No Does the patient have difficulty dressing or bathing?: No Does the patient have difficulty doing errands alone such as visiting a doctor's office or shopping?: No     11/24/2021    1:30 PM 03/03/2022    2:04 PM 06/13/2022    1:48 PM 07/08/2022   11:44 AM 03/14/2023    2:34 PM  Fall Risk  Falls in the past year? 0 1  0 0  Was there an injury with Fall?  1 1 0 0  Fall Risk Category Calculator  2  0 0  Fall Risk Category  (Retired)  Moderate  Low   (RETIRED) Patient Fall Risk Level Low fall risk Moderate fall risk  Moderate fall risk   Patient at Risk for Falls Due to No Fall Risks    No Fall Risks  Fall risk Follow up Falls evaluation completed Falls evaluation completed  Falls evaluation completed Falls evaluation completed       03/14/2023    2:34 PM  Depression screen PHQ 2/9  Decreased Interest 0  Down, Depressed, Hopeless 0  PHQ - 2 Score 0        No data to display            Current Medication: Outpatient Encounter Medications as of 03/14/2023  Medication Sig   albuterol (VENTOLIN HFA) 108 (90 Base) MCG/ACT inhaler INHALE 2 PUFFS INTO THE LUNGS EVERY 6 HOURS AS NEEDED FOR WHEEZING OR SHORTNESS OF BREATH   amLODipine (NORVASC) 10 MG tablet Take 10 mg by mouth daily.   ASPIRIN 81 PO Take 81 mg by mouth. 1 daily   atorvastatin (LIPITOR) 20 MG tablet Take 20 mg by mouth daily.   Calcium-Magnesium-Vitamin D 400-166.7-133.3 MG-MG-UNIT TABS Take by mouth.   fluticasone (FLONASE) 50 MCG/ACT nasal spray SHAKE LIQUID AND USE 2 SPRAYS IN EACH NOSTRIL DAILY  montelukast (SINGULAIR) 10 MG tablet TAKE 1 TABLET BY MOUTH DAILY FOR CHRONIC ALLERGIC RHINITIS/ASTHMA   pantoprazole (PROTONIX) 40 MG tablet Take 1 tablet (40 mg total) by mouth daily.   potassium chloride (KLOR-CON) 10 MEQ tablet TAKE 1 TABLET BY MOUTH DAILY   predniSONE (STERAPRED UNI-PAK 21 TAB) 10 MG (21) TBPK tablet Use as directed for 6 days   vitamin C (ASCORBIC ACID) 500 MG tablet Take 500 mg by mouth daily.   vitamin E 1000 UNIT capsule Take 1,000 Units by mouth daily.   No facility-administered encounter medications on file as of 03/14/2023.    Surgical History: Past Surgical History:  Procedure Laterality Date   cartaract surgery both eye Bilateral    CORONARY ARTERY BYPASS GRAFT Bilateral    CYSTOSCOPY     HERNIA REPAIR     knee replacment Bilateral    LEFT ATRIAL APPENDAGE OCCLUSION  11/10/2021   retina surgery on right eye   Right 05/16/2017   TONSILLECTOMY Bilateral     Medical History: Past Medical History:  Diagnosis Date   Asthma    Atrial fibrillation (HCC)    Coronary artery disease    Hyperlipidemia    Hypertension     Family History: Family History  Problem Relation Age of Onset   Diabetes Mother    Bowel Disease Father    Alcohol abuse Father     Social History   Socioeconomic History   Marital status: Married    Spouse name: Not on file   Number of children: Not on file   Years of education: Not on file   Highest education level: Not on file  Occupational History   Not on file  Tobacco Use   Smoking status: Former   Smokeless tobacco: Never  Substance and Sexual Activity   Alcohol use: No   Drug use: No   Sexual activity: Not on file  Other Topics Concern   Not on file  Social History Narrative   Not on file   Social Determinants of Health   Financial Resource Strain: Low Risk  (03/23/2021)   Overall Financial Resource Strain (CARDIA)    Difficulty of Paying Living Expenses: Not very hard  Food Insecurity: Not on file  Transportation Needs: Not on file  Physical Activity: Not on file  Stress: Not on file  Social Connections: Not on file  Intimate Partner Violence: Not on file      Review of Systems  Constitutional:  Negative for activity change, appetite change, chills, fatigue, fever and unexpected weight change.  HENT: Negative.  Negative for congestion, ear pain, rhinorrhea, sore throat and trouble swallowing.   Eyes: Negative.   Respiratory: Negative.  Negative for cough, chest tightness, shortness of breath and wheezing.   Cardiovascular: Negative.  Negative for chest pain and palpitations.  Gastrointestinal: Negative.  Negative for abdominal pain, blood in stool, constipation, diarrhea, nausea and vomiting.  Endocrine: Negative.   Genitourinary: Negative.  Negative for difficulty urinating, dysuria, frequency, hematuria and urgency.  Musculoskeletal:  Negative.  Negative for arthralgias, back pain, joint swelling, myalgias and neck pain.  Skin: Negative.  Negative for rash and wound.  Allergic/Immunologic: Negative.  Negative for immunocompromised state.  Neurological: Negative.  Negative for dizziness, seizures, numbness and headaches.  Hematological: Negative.   Psychiatric/Behavioral: Negative.  Negative for behavioral problems, self-injury and suicidal ideas. The patient is not nervous/anxious.     Vital Signs: BP 126/80   Pulse 71   Temp 98.2 F (36.8 C)  Resp 16   Ht 5\' 8"  (1.727 m)   Wt 191 lb (86.6 kg)   SpO2 97%   BMI 29.04 kg/m    Physical Exam Vitals reviewed.  Constitutional:      General: He is awake. He is not in acute distress.    Appearance: Normal appearance. He is well-developed and well-groomed. He is not ill-appearing or diaphoretic.  HENT:     Head: Normocephalic and atraumatic.     Right Ear: Tympanic membrane, ear canal and external ear normal.     Left Ear: Tympanic membrane, ear canal and external ear normal.     Nose: Nose normal. No congestion or rhinorrhea.     Mouth/Throat:     Lips: Pink.     Mouth: Mucous membranes are moist.     Pharynx: Oropharynx is clear. Uvula midline. No oropharyngeal exudate.  Eyes:     General: Lids are normal. Vision grossly intact. Gaze aligned appropriately. No scleral icterus.       Right eye: No discharge.        Left eye: No discharge.     Conjunctiva/sclera: Conjunctivae normal.     Pupils: Pupils are equal, round, and reactive to light.     Funduscopic exam:    Right eye: Red reflex present.        Left eye: Red reflex present. Neck:     Thyroid: No thyromegaly.     Vascular: No JVD.     Trachea: Trachea and phonation normal. No tracheal deviation.  Cardiovascular:     Rate and Rhythm: Normal rate and regular rhythm.     Pulses: Normal pulses.     Heart sounds: Normal heart sounds, S1 normal and S2 normal. No murmur heard.    No friction rub. No  gallop.  Pulmonary:     Effort: Pulmonary effort is normal. No accessory muscle usage or respiratory distress.     Breath sounds: Normal breath sounds and air entry. No stridor. No wheezing or rales.  Chest:     Chest wall: No tenderness.  Abdominal:     General: Bowel sounds are normal. There is no distension.     Palpations: Abdomen is soft. There is no shifting dullness, fluid wave, mass or pulsatile mass.     Tenderness: There is no abdominal tenderness. There is no guarding or rebound.  Musculoskeletal:        General: No tenderness or deformity. Normal range of motion.     Cervical back: Normal range of motion and neck supple.     Right lower leg: No edema.     Left lower leg: No edema.  Lymphadenopathy:     Cervical: No cervical adenopathy.  Skin:    General: Skin is warm and dry.     Capillary Refill: Capillary refill takes less than 2 seconds.     Coloration: Skin is not pale.     Findings: No erythema or rash.  Neurological:     Mental Status: He is alert and oriented to person, place, and time.     Cranial Nerves: No cranial nerve deficit.     Motor: No abnormal muscle tone.     Coordination: Coordination normal.     Deep Tendon Reflexes: Reflexes are normal and symmetric.  Psychiatric:        Mood and Affect: Mood normal.        Behavior: Behavior normal. Behavior is cooperative.        Thought Content: Thought content normal.  Judgment: Judgment normal.        Assessment/Plan: 1. Encounter for routine adult health examination with abnormal findings Age-appropriate preventive screenings and vaccinations discussed, annual physical exam completed. Routine labs for health maintenance ordered, see below. PHM updated.  - CBC with Differential/Platelet - CMP14+EGFR - Lipid Profile - PSA Total (Reflex To Free)  2. Paroxysmal atrial fibrillation (HCC) Watchman device in place. Routine labs ordered. Continue current medications as ordered . - CBC with  Differential/Platelet - CMP14+EGFR - Lipid Profile - PSA Total (Reflex To Free)  3. Persistent cough for 3 weeks or longer Continued cough, imaging is negative for abnormalities.   4. Chronic bronchiectasis not affecting current episode of care [facility]) Continue to monitor   5. Non-seasonal allergic rhinitis due to other allergic trigger Continue prn allergy medications   6. Mixed hyperlipidemia Routine labs ordered  - CBC with Differential/Platelet - CMP14+EGFR - Lipid Profile - PSA Total (Reflex To Free)  7. Screening for prostate cancer Routine PSA lab ordered  - PSA Total (Reflex To Free)     General Counseling: James Mathews understanding of the findings of todays visit and agrees with plan of treatment. I have discussed any further diagnostic evaluation that may be needed or ordered today. We also reviewed his medications today. he has been encouraged to call the office with any questions or concerns that should arise related to todays visit.    Orders Placed This Encounter  Procedures   CBC with Differential/Platelet   CMP14+EGFR   Lipid Profile   PSA Total (Reflex To Free)    No orders of the defined types were placed in this encounter.   Return in about 6 months (around 09/14/2023) for F/U, Derrick Tiegs PCP.   Total time spent:30 Minutes Time spent includes review of chart, medications, test results, and follow up plan with the patient.   [place] Controlled Substance Database was reviewed by me.  This patient was seen by Sallyanne Kuster, FNP-C in collaboration with Dr. Beverely Risen as a part of collaborative care agreement.  Danille Oppedisano R. Tedd Sias, MSN, FNP-C Internal medicine

## 2023-03-15 ENCOUNTER — Ambulatory Visit (INDEPENDENT_AMBULATORY_CARE_PROVIDER_SITE_OTHER): Payer: Medicare Other | Admitting: Nurse Practitioner

## 2023-03-15 ENCOUNTER — Telehealth: Payer: Self-pay | Admitting: Nurse Practitioner

## 2023-03-15 DIAGNOSIS — H6123 Impacted cerumen, bilateral: Secondary | ICD-10-CM

## 2023-03-15 NOTE — Progress Notes (Signed)
Unsuccessful ear lavage. Bilateral impacted cerumen. Need referral for ENT

## 2023-03-15 NOTE — Telephone Encounter (Signed)
Otolaryngology referral sent via [facility] to  [facility] ?

## 2023-03-17 NOTE — Progress Notes (Signed)
ENT referral already ordered

## 2023-03-30 DIAGNOSIS — Z0001 Encounter for general adult medical examination with abnormal findings: Secondary | ICD-10-CM | POA: Diagnosis not present

## 2023-03-30 DIAGNOSIS — Z125 Encounter for screening for malignant neoplasm of prostate: Secondary | ICD-10-CM | POA: Diagnosis not present

## 2023-03-30 DIAGNOSIS — E782 Mixed hyperlipidemia: Secondary | ICD-10-CM | POA: Diagnosis not present

## 2023-03-31 LAB — CBC WITH DIFFERENTIAL/PLATELET
Basophils Absolute: 0.1 10*3/uL (ref 0.0–0.2)
Basos: 1 %
EOS (ABSOLUTE): 0.4 10*3/uL (ref 0.0–0.4)
Eos: 7 %
Hematocrit: 41.5 % (ref 37.5–51.0)
Hemoglobin: 13.4 g/dL (ref 13.0–17.7)
Immature Grans (Abs): 0 10*3/uL (ref 0.0–0.1)
Immature Granulocytes: 1 %
Lymphocytes Absolute: 2.3 10*3/uL (ref 0.7–3.1)
Lymphs: 36 %
MCH: 27.2 pg (ref 26.6–33.0)
MCHC: 32.3 g/dL (ref 31.5–35.7)
MCV: 84 fL (ref 79–97)
Monocytes Absolute: 0.6 10*3/uL (ref 0.1–0.9)
Monocytes: 10 %
Neutrophils Absolute: 2.9 10*3/uL (ref 1.4–7.0)
Neutrophils: 45 %
Platelets: 235 10*3/uL (ref 150–450)
RBC: 4.92 x10E6/uL (ref 4.14–5.80)
RDW: 16 % — ABNORMAL HIGH (ref 11.6–15.4)
WBC: 6.3 10*3/uL (ref 3.4–10.8)

## 2023-03-31 LAB — CMP14+EGFR
ALT: 23 IU/L (ref 0–44)
AST: 23 IU/L (ref 0–40)
Albumin: 4.7 g/dL (ref 3.7–4.7)
Alkaline Phosphatase: 71 IU/L (ref 44–121)
BUN/Creatinine Ratio: 12 (ref 10–24)
BUN: 11 mg/dL (ref 8–27)
Bilirubin Total: 0.9 mg/dL (ref 0.0–1.2)
CO2: 22 mmol/L (ref 20–29)
Calcium: 9.6 mg/dL (ref 8.6–10.2)
Chloride: 103 mmol/L (ref 96–106)
Creatinine, Ser: 0.95 mg/dL (ref 0.76–1.27)
Globulin, Total: 1.9 g/dL (ref 1.5–4.5)
Glucose: 136 mg/dL — ABNORMAL HIGH (ref 70–99)
Potassium: 3.9 mmol/L (ref 3.5–5.2)
Sodium: 141 mmol/L (ref 134–144)
Total Protein: 6.6 g/dL (ref 6.0–8.5)
eGFR: 79 mL/min/{1.73_m2} (ref >59)

## 2023-03-31 LAB — LIPID PANEL
Chol/HDL Ratio: 4.2 ratio (ref 0.0–5.0)
Cholesterol, Total: 168 mg/dL (ref 100–199)
HDL: 40 mg/dL (ref >39)
LDL Chol Calc (NIH): 102 mg/dL — ABNORMAL HIGH (ref 0–99)
Triglycerides: 146 mg/dL (ref 0–149)
VLDL Cholesterol Cal: 26 mg/dL (ref 5–40)

## 2023-03-31 LAB — PSA TOTAL (REFLEX TO FREE): Prostate Specific Ag, Serum: 0.2 ng/mL (ref 0.0–4.0)

## 2023-04-01 NOTE — Progress Notes (Signed)
CBC is normal PSA is normal CMP is normal except for slightly elevated glucose  Cholesterol is normal except for slightly elevated LDL 102 which is not very concerning but it increased from 48. Watch the cholesterol, eat good lean proteins.

## 2023-04-03 ENCOUNTER — Telehealth: Payer: Self-pay

## 2023-04-03 NOTE — Telephone Encounter (Signed)
Pt.notified

## 2023-04-03 NOTE — Telephone Encounter (Signed)
-----   Message from [facility] sent at 04/01/2023 11:27 AM EDT ----- CBC is normal PSA is normal CMP is normal except for slightly elevated glucose  Cholesterol is normal except for slightly elevated LDL 102 which is not very concerning but it increased from 48. Watch the cholesterol, eat good lean proteins.

## 2023-04-26 ENCOUNTER — Other Ambulatory Visit: Payer: Self-pay | Admitting: Nurse Practitioner

## 2023-04-26 DIAGNOSIS — Z760 Encounter for issue of repeat prescription: Secondary | ICD-10-CM

## 2023-05-09 DIAGNOSIS — M546 Pain in thoracic spine: Secondary | ICD-10-CM | POA: Diagnosis not present

## 2023-05-09 DIAGNOSIS — J209 Acute bronchitis, unspecified: Secondary | ICD-10-CM | POA: Diagnosis not present

## 2023-05-09 DIAGNOSIS — R051 Acute cough: Secondary | ICD-10-CM | POA: Diagnosis not present

## 2023-05-09 DIAGNOSIS — J019 Acute sinusitis, unspecified: Secondary | ICD-10-CM | POA: Diagnosis not present

## 2023-05-23 ENCOUNTER — Telehealth: Payer: Self-pay | Admitting: Nurse Practitioner

## 2023-05-23 NOTE — Telephone Encounter (Signed)
Otolaryngology appointment 05/31/2023 @ [facility] -Sheralyn Boatman

## 2023-05-31 DIAGNOSIS — H6123 Impacted cerumen, bilateral: Secondary | ICD-10-CM | POA: Diagnosis not present

## 2023-05-31 DIAGNOSIS — H60532 Acute contact otitis externa, left ear: Secondary | ICD-10-CM | POA: Diagnosis not present

## 2023-06-15 DIAGNOSIS — Z23 Encounter for immunization: Secondary | ICD-10-CM | POA: Diagnosis not present

## 2023-07-10 DIAGNOSIS — I10 Essential (primary) hypertension: Secondary | ICD-10-CM | POA: Diagnosis not present

## 2023-07-12 ENCOUNTER — Other Ambulatory Visit: Payer: Self-pay | Admitting: Nurse Practitioner

## 2023-07-12 DIAGNOSIS — Z760 Encounter for issue of repeat prescription: Secondary | ICD-10-CM

## 2023-07-19 ENCOUNTER — Other Ambulatory Visit: Payer: Self-pay | Admitting: Nurse Practitioner

## 2023-07-19 DIAGNOSIS — J3089 Other allergic rhinitis: Secondary | ICD-10-CM

## 2023-07-31 DIAGNOSIS — I71012 Dissection of descending thoracic aorta: Secondary | ICD-10-CM | POA: Diagnosis not present

## 2023-07-31 DIAGNOSIS — I7772 Dissection of iliac artery: Secondary | ICD-10-CM | POA: Diagnosis not present

## 2023-07-31 DIAGNOSIS — I714 Abdominal aortic aneurysm, without rupture, unspecified: Secondary | ICD-10-CM | POA: Diagnosis not present

## 2023-07-31 DIAGNOSIS — I77810 Thoracic aortic ectasia: Secondary | ICD-10-CM | POA: Diagnosis not present

## 2023-07-31 DIAGNOSIS — I7121 Aneurysm of the ascending aorta, without rupture: Secondary | ICD-10-CM | POA: Diagnosis not present

## 2023-07-31 DIAGNOSIS — Z87891 Personal history of nicotine dependence: Secondary | ICD-10-CM | POA: Diagnosis not present

## 2023-07-31 DIAGNOSIS — I728 Aneurysm of other specified arteries: Secondary | ICD-10-CM | POA: Diagnosis not present

## 2023-07-31 DIAGNOSIS — I7779 Dissection of other artery: Secondary | ICD-10-CM | POA: Diagnosis not present

## 2023-07-31 DIAGNOSIS — I7773 Dissection of renal artery: Secondary | ICD-10-CM | POA: Diagnosis not present

## 2023-07-31 DIAGNOSIS — I7143 Infrarenal abdominal aortic aneurysm, without rupture: Secondary | ICD-10-CM | POA: Diagnosis not present

## 2023-08-16 ENCOUNTER — Other Ambulatory Visit: Payer: Self-pay | Admitting: Nurse Practitioner

## 2023-08-16 DIAGNOSIS — E876 Hypokalemia: Secondary | ICD-10-CM

## 2023-08-18 ENCOUNTER — Encounter: Payer: Self-pay | Admitting: Physician Assistant

## 2023-08-18 ENCOUNTER — Ambulatory Visit (INDEPENDENT_AMBULATORY_CARE_PROVIDER_SITE_OTHER): Payer: Medicare Other | Admitting: Physician Assistant

## 2023-08-18 VITALS — BP 150/70 | HR 86 | Temp 97.9°F | Resp 16 | Ht 68.0 in | Wt 189.6 lb

## 2023-08-18 DIAGNOSIS — J0141 Acute recurrent pansinusitis: Secondary | ICD-10-CM | POA: Diagnosis not present

## 2023-08-18 MED ORDER — AMOXICILLIN-POT CLAVULANATE 875-125 MG PO TABS
1.0000 | ORAL_TABLET | Freq: Two times a day (BID) | ORAL | 0 refills | Status: DC
Start: 2023-08-18 — End: 2023-09-14

## 2023-08-18 NOTE — Progress Notes (Signed)
[facility] [address] [place], [place] [address]  Internal MEDICINE  Office Visit Note  Patient Name: James Mathews  [id number]  [id number]  Date of Service: 08/28/2023  Chief Complaint  Patient presents with   Acute Visit   Sinusitis     HPI Pt is here for a sick visit. -Sinus pressure and congestion for about 2 weeks and getting worse -Coughing, sneezing, decreased appetite, headaches -Tries mucinex, sudafed -using albuterol inhaler maybe once per day -will monitor BP  Current Medication:  Outpatient Encounter Medications as of 08/18/2023  Medication Sig   albuterol (VENTOLIN HFA) 108 (90 Base) MCG/ACT inhaler INHALE 2 PUFFS INTO THE LUNGS EVERY 6 HOURS AS NEEDED FOR WHEEZING OR SHORTNESS OF BREATH   amLODipine (NORVASC) 10 MG tablet Take 10 mg by mouth daily.   amoxicillin-clavulanate (AUGMENTIN) 875-125 MG tablet Take 1 tablet by mouth 2 (two) times daily. Take with food.   ASPIRIN 81 PO Take 81 mg by mouth. 1 daily   atorvastatin (LIPITOR) 20 MG tablet Take 20 mg by mouth daily.   Calcium-Magnesium-Vitamin D 400-166.7-133.3 MG-MG-UNIT TABS Take by mouth.   fluticasone (FLONASE) 50 MCG/ACT nasal spray SHAKE LIQUID AND USE 2 SPRAYS IN EACH NOSTRIL DAILY   montelukast (SINGULAIR) 10 MG tablet TAKE 1 TABLET BY MOUTH DAILY FOR CHRONIC ALLERGIC RHINITIS/ASTHMA   pantoprazole (PROTONIX) 40 MG tablet TAKE 1 TABLET(40 MG) BY MOUTH DAILY   potassium chloride (KLOR-CON) 10 MEQ tablet TAKE 1 TABLET BY MOUTH DAILY   predniSONE (STERAPRED UNI-PAK 21 TAB) 10 MG (21) TBPK tablet Use as directed for 6 days   vitamin C (ASCORBIC ACID) 500 MG tablet Take 500 mg by mouth daily.   vitamin E 1000 UNIT capsule Take 1,000 Units by mouth daily.   No facility-administered encounter medications on file as of 08/18/2023.      Medical History: Past Medical History:  Diagnosis Date   Asthma    Atrial fibrillation (HCC)    Coronary artery disease    Hyperlipidemia     Hypertension      Vital Signs: BP (!) 150/70   Pulse 86   Temp 97.9 F (36.6 C)   Resp 16   Ht 5\' 8"  (1.727 m)   Wt 189 lb 9.6 oz (86 kg)   SpO2 96%   BMI 28.83 kg/m    Review of Systems  Constitutional:  Positive for appetite change. Negative for fatigue and fever.  HENT:  Positive for congestion, postnasal drip, sinus pressure and sneezing. Negative for mouth sores.   Respiratory:  Positive for cough.   Cardiovascular:  Negative for chest pain.  Genitourinary:  Negative for flank pain.  Neurological:  Positive for headaches.  Psychiatric/Behavioral: Negative.      Physical Exam Vitals reviewed.  Constitutional:      General: He is not in acute distress.    Appearance: Normal appearance. He is not ill-appearing.  HENT:     Head: Normocephalic and atraumatic.  Eyes:     Pupils: Pupils are equal, round, and reactive to light.  Cardiovascular:     Rate and Rhythm: Normal rate and regular rhythm.  Pulmonary:     Effort: Pulmonary effort is normal. No respiratory distress.     Breath sounds: No wheezing.  Neurological:     Mental Status: He is alert and oriented to person, place, and time.  Psychiatric:        Mood and Affect: Mood normal.        Behavior:  Behavior normal.       Assessment/Plan: 1. Acute recurrent pansinusitis (Primary) Will start on augmentin and may continue mucinex as needed for congestion - amoxicillin-clavulanate (AUGMENTIN) 875-125 MG tablet; Take 1 tablet by mouth 2 (two) times daily. Take with food.  Dispense: 20 tablet; Refill: 0   General Counseling: traquan pillado understanding of the findings of todays visit and agrees with plan of treatment. I have discussed any further diagnostic evaluation that may be needed or ordered today. We also reviewed his medications today. he has been encouraged to call the office with any questions or concerns that should arise related to todays visit.    Counseling:    No orders of the  defined types were placed in this encounter.   Meds ordered this encounter  Medications   amoxicillin-clavulanate (AUGMENTIN) 875-125 MG tablet    Sig: Take 1 tablet by mouth 2 (two) times daily. Take with food.    Dispense:  20 tablet    Refill:  0    Time spent:25 Minutes

## 2023-09-14 ENCOUNTER — Encounter: Payer: Self-pay | Admitting: Nurse Practitioner

## 2023-09-14 ENCOUNTER — Ambulatory Visit: Payer: Medicare Other | Admitting: Nurse Practitioner

## 2023-09-14 VITALS — BP 120/68 | HR 60 | Temp 98.1°F | Resp 16 | Ht 68.0 in | Wt 193.0 lb

## 2023-09-14 DIAGNOSIS — I480 Paroxysmal atrial fibrillation: Secondary | ICD-10-CM | POA: Diagnosis not present

## 2023-09-14 DIAGNOSIS — I10 Essential (primary) hypertension: Secondary | ICD-10-CM | POA: Diagnosis not present

## 2023-09-14 DIAGNOSIS — J0110 Acute frontal sinusitis, unspecified: Secondary | ICD-10-CM

## 2023-09-14 MED ORDER — PREDNISONE 10 MG (21) PO TBPK: ORAL_TABLET | ORAL | 0 refills | Status: DC

## 2023-09-14 MED ORDER — LEVOFLOXACIN 750 MG PO TABS
750.0000 mg | ORAL_TABLET | Freq: Every day | ORAL | 0 refills | Status: DC
Start: 2023-09-14 — End: 2024-03-14

## 2023-09-14 NOTE — Progress Notes (Signed)
 [facility] [address] [place], [place] [address]  Internal MEDICINE  Office Visit Note  Patient Name: James Mathews  [id number]  [id number]  Date of Service: 09/14/2023  Chief Complaint  Patient presents with   Hyperlipidemia   Hypertension   Follow-up    HPI Tonya presents for a follow-up visit for sinus infection, hypertension, and afib.  Sinus infection -- onset of symptoms was originally about a month ago, was treated with augmentin  on 12/6 but symptoms did not resolve. headache, sore throat, nasal congestion, sinus pressure/pain, neck soreness, achy, cough. Denies any fever, chills, SOB, chest tightness, or wheezing .  BP is stable Afib -- rhythm and rate are regular.     Current Medication: Outpatient Encounter Medications as of 09/14/2023  Medication Sig   albuterol  (VENTOLIN  HFA) 108 (90 Base) MCG/ACT inhaler INHALE 2 PUFFS INTO THE LUNGS EVERY 6 HOURS AS NEEDED FOR WHEEZING OR SHORTNESS OF BREATH   amLODipine  (NORVASC ) 10 MG tablet Take 10 mg by mouth daily.   ASPIRIN  81 PO Take 81 mg by mouth. 1 daily   atorvastatin  (LIPITOR) 20 MG tablet Take 20 mg by mouth daily.   Calcium -Magnesium-Vitamin D 400-166.7-133.3 MG-MG-UNIT TABS Take by mouth.   levofloxacin  (LEVAQUIN ) 750 MG tablet Take 1 tablet (750 mg total) by mouth daily.   montelukast  (SINGULAIR ) 10 MG tablet TAKE 1 TABLET BY MOUTH DAILY FOR CHRONIC ALLERGIC RHINITIS/ASTHMA   pantoprazole  (PROTONIX ) 40 MG tablet TAKE 1 TABLET(40 MG) BY MOUTH DAILY   potassium chloride  (KLOR-CON ) 10 MEQ tablet TAKE 1 TABLET BY MOUTH DAILY   vitamin C  (ASCORBIC ACID) 500 MG tablet Take 500 mg by mouth daily.   vitamin E 1000 UNIT capsule Take 1,000 Units by mouth daily.   [DISCONTINUED] amoxicillin -clavulanate (AUGMENTIN ) 875-125 MG tablet Take 1 tablet by mouth 2 (two) times daily. Take with food.   fluticasone  (FLONASE ) 50 MCG/ACT nasal spray SHAKE LIQUID AND USE 2 SPRAYS IN EACH NOSTRIL DAILY (Patient not  taking: Reported on 09/14/2023)   predniSONE  (STERAPRED UNI-PAK 21 TAB) 10 MG (21) TBPK tablet Use as directed for 6 days   [DISCONTINUED] predniSONE  (STERAPRED UNI-PAK 21 TAB) 10 MG (21) TBPK tablet Use as directed for 6 days (Patient not taking: Reported on 09/14/2023)   No facility-administered encounter medications on file as of 09/14/2023.    Surgical History: Past Surgical History:  Procedure Laterality Date   cartaract surgery both eye Bilateral    CORONARY ARTERY BYPASS GRAFT Bilateral    CYSTOSCOPY     HERNIA REPAIR     knee replacment Bilateral    LEFT ATRIAL APPENDAGE OCCLUSION  11/10/2021   retina surgery on right eye  Right 05/16/2017   TONSILLECTOMY Bilateral     Medical History: Past Medical History:  Diagnosis Date   Asthma    Atrial fibrillation (HCC)    Coronary artery disease    Hyperlipidemia    Hypertension     Family History: Family History  Problem Relation Age of Onset   Diabetes Mother    Bowel Disease Father    Alcohol abuse Father     Social History   Socioeconomic History   Marital status: Married    Spouse name: Not on file   Number of children: Not on file   Years of education: Not on file   Highest education level: Not on file  Occupational History   Not on file  Tobacco Use   Smoking status: Former   Smokeless tobacco: Never  Substance  and Sexual Activity   Alcohol use: No   Drug use: No   Sexual activity: Not on file  Other Topics Concern   Not on file  Social History Narrative   Not on file   Social Drivers of Health   Financial Resource Strain: Low Risk  (03/23/2021)   Overall Financial Resource Strain (CARDIA)    Difficulty of Paying Living Expenses: Not very hard  Food Insecurity: Not on file  Transportation Needs: Not on file  Physical Activity: Not on file  Stress: Not on file  Social Connections: Not on file  Intimate Partner Violence: Not on file      Review of Systems  Constitutional:  Positive for appetite  change. Negative for fatigue and fever.  HENT:  Positive for congestion, postnasal drip, sinus pressure and sneezing. Negative for mouth sores.   Respiratory:  Positive for cough.   Cardiovascular:  Negative for chest pain.  Genitourinary:  Negative for flank pain.  Neurological:  Positive for headaches.  Psychiatric/Behavioral: Negative.      Vital Signs: BP 120/68   Pulse 60   Temp 98.1 F (36.7 C)   Resp 16   Ht 5' 8 (1.727 m)   Wt 193 lb (87.5 kg)   SpO2 98%   BMI 29.35 kg/m    Physical Exam Vitals reviewed.  Constitutional:      General: He is not in acute distress.    Appearance: Normal appearance. He is not ill-appearing.  HENT:     Head: Normocephalic and atraumatic.     Right Ear: Tympanic membrane, ear canal and external ear normal.     Left Ear: Tympanic membrane, ear canal and external ear normal.     Nose: Mucosal edema, congestion and rhinorrhea present.     Right Turbinates: Swollen and pale.     Left Turbinates: Swollen and pale.     Right Sinus: Frontal sinus tenderness present.     Left Sinus: Frontal sinus tenderness present.     Mouth/Throat:     Mouth: Mucous membranes are moist.     Pharynx: Posterior oropharyngeal erythema present.  Eyes:     Extraocular Movements: Extraocular movements intact.     Conjunctiva/sclera: Conjunctivae normal.     Pupils: Pupils are equal, round, and reactive to light.  Cardiovascular:     Rate and Rhythm: Normal rate and regular rhythm.     Heart sounds: Normal heart sounds. No murmur heard. Pulmonary:     Effort: Pulmonary effort is normal. No respiratory distress.     Breath sounds: Normal breath sounds. No wheezing.  Lymphadenopathy:     Cervical: Cervical adenopathy present.  Neurological:     Mental Status: He is alert and oriented to person, place, and time.  Psychiatric:        Mood and Affect: Mood normal.        Behavior: Behavior normal.        Assessment/Plan: 1. Acute non-recurrent frontal  sinusitis (Primary) Leovfloxacin prescribed, take until gone, prednisone  taper also prescribed.  - predniSONE  (STERAPRED UNI-PAK 21 TAB) 10 MG (21) TBPK tablet; Use as directed for 6 days  Dispense: 21 tablet; Refill: 0 - levofloxacin  (LEVAQUIN ) 750 MG tablet; Take 1 tablet (750 mg total) by mouth daily.  Dispense: 7 tablet; Refill: 0  2. Paroxysmal atrial fibrillation (HCC) Continue amlodipine  as prescribed   3. Essential hypertension Stable, continue amlodipine  as prescribed    General Counseling: amram maya understanding of the findings of todays visit and  agrees with plan of treatment. I have discussed any further diagnostic evaluation that may be needed or ordered today. We also reviewed his medications today. he has been encouraged to call the office with any questions or concerns that should arise related to todays visit.    No orders of the defined types were placed in this encounter.   Meds ordered this encounter  Medications   predniSONE  (STERAPRED UNI-PAK 21 TAB) 10 MG (21) TBPK tablet    Sig: Use as directed for 6 days    Dispense:  21 tablet    Refill:  0    Please fill new script today   levofloxacin  (LEVAQUIN ) 750 MG tablet    Sig: Take 1 tablet (750 mg total) by mouth daily.    Dispense:  7 tablet    Refill:  0    Fill new script today.    Return for previously scheduled, AWV, Phiona Ramnauth PCP in july .   Total time spent:30 Minutes Time spent includes review of chart, medications, test results, and follow up plan with the patient.   [place] Controlled Substance Database was reviewed by me.  This patient was seen by Mardy Maxin, FNP-C in collaboration with Dr. Sigrid Bathe as a part of collaborative care agreement.   Dorna Mallet R. Maxin, MSN, FNP-C Internal medicine

## 2023-10-10 ENCOUNTER — Encounter: Payer: Self-pay | Admitting: Nurse Practitioner

## 2023-10-10 ENCOUNTER — Telehealth: Payer: Self-pay | Admitting: Nurse Practitioner

## 2023-10-10 ENCOUNTER — Ambulatory Visit (INDEPENDENT_AMBULATORY_CARE_PROVIDER_SITE_OTHER): Payer: Medicare Other | Admitting: Nurse Practitioner

## 2023-10-10 VITALS — BP 130/76 | HR 66 | Temp 96.9°F | Resp 16 | Ht 68.0 in | Wt 193.4 lb

## 2023-10-10 DIAGNOSIS — J0141 Acute recurrent pansinusitis: Secondary | ICD-10-CM | POA: Diagnosis not present

## 2023-10-10 MED ORDER — PREDNISONE 10 MG (21) PO TBPK: ORAL_TABLET | ORAL | 0 refills | Status: DC

## 2023-10-10 NOTE — Progress Notes (Signed)
[facility] [address] [place], [place] [address]  Internal MEDICINE  Office Visit Note  Patient Name: James Mathews  [id number]  [id number]  Date of Service: 10/10/2023  Chief Complaint  Patient presents with   Acute Visit    Sinus issues. Covid test negative.      HPI James Mathews presents for an acute sick visit for continued sinus issues.  Recently treated for sinus infection earlier this month.  Was treated for a pansinusitis at the beginning of December last year as well.  Also had an ear infection in September last year.  Had bronchitis in August last year as well.  Seems to have recurrent sinus and respiratory infections.  Reports minimal relief after treatment with augmentin in December and minimal relief after treatment with levofloxacin earlier this month.        Current Medication:  Outpatient Encounter Medications as of 10/10/2023  Medication Sig   albuterol (VENTOLIN HFA) 108 (90 Base) MCG/ACT inhaler INHALE 2 PUFFS INTO THE LUNGS EVERY 6 HOURS AS NEEDED FOR WHEEZING OR SHORTNESS OF BREATH   amLODipine (NORVASC) 10 MG tablet Take 10 mg by mouth daily.   ASPIRIN 81 PO Take 81 mg by mouth. 1 daily   atorvastatin (LIPITOR) 20 MG tablet Take 20 mg by mouth daily.   Calcium-Magnesium-Vitamin D 400-166.7-133.3 MG-MG-UNIT TABS Take by mouth.   fluticasone (FLONASE) 50 MCG/ACT nasal spray SHAKE LIQUID AND USE 2 SPRAYS IN EACH NOSTRIL DAILY   levofloxacin (LEVAQUIN) 750 MG tablet Take 1 tablet (750 mg total) by mouth daily.   montelukast (SINGULAIR) 10 MG tablet TAKE 1 TABLET BY MOUTH DAILY FOR CHRONIC ALLERGIC RHINITIS/ASTHMA   pantoprazole (PROTONIX) 40 MG tablet TAKE 1 TABLET(40 MG) BY MOUTH DAILY   potassium chloride (KLOR-CON) 10 MEQ tablet TAKE 1 TABLET BY MOUTH DAILY   predniSONE (STERAPRED UNI-PAK 21 TAB) 10 MG (21) TBPK tablet Use as directed for 6 days   vitamin C (ASCORBIC ACID) 500 MG tablet Take 500 mg by mouth daily.   vitamin E 1000 UNIT  capsule Take 1,000 Units by mouth daily.   [DISCONTINUED] predniSONE (STERAPRED UNI-PAK 21 TAB) 10 MG (21) TBPK tablet Use as directed for 6 days   No facility-administered encounter medications on file as of 10/10/2023.      Medical History: Past Medical History:  Diagnosis Date   Asthma    Atrial fibrillation (HCC)    Coronary artery disease    Hyperlipidemia    Hypertension      Vital Signs: BP 130/76   Pulse 66   Temp (!) 96.9 F (36.1 C)   Resp 16   Ht 5\' 8"  (1.727 m)   Wt 193 lb 6.4 oz (87.7 kg)   SpO2 95%   BMI 29.41 kg/m    Review of Systems  Constitutional:  Positive for appetite change. Negative for fatigue and fever.  HENT:  Positive for congestion, ear pain (ear fullness), postnasal drip, sinus pressure, sinus pain and sneezing. Negative for mouth sores.   Respiratory:  Positive for cough. Negative for chest tightness, shortness of breath and wheezing.   Cardiovascular:  Negative for chest pain and palpitations.  Genitourinary:  Negative for flank pain.  Neurological:  Positive for headaches.  Psychiatric/Behavioral: Negative.      Physical Exam Vitals reviewed.  Constitutional:      General: He is not in acute distress.    Appearance: Normal appearance. He is not ill-appearing.  HENT:     Head: Normocephalic and  atraumatic.     Right Ear: External ear normal. Swelling and tenderness present.     Left Ear: External ear normal. Swelling and tenderness present.     Ears:     Comments: Unable to visualize TM bilaterally due to swelling of ear canal.     Nose: Mucosal edema, congestion and rhinorrhea present.     Right Turbinates: Swollen and pale.     Left Turbinates: Swollen and pale.     Right Sinus: Frontal sinus tenderness present.     Left Sinus: Frontal sinus tenderness present.     Mouth/Throat:     Mouth: Mucous membranes are moist.     Pharynx: Posterior oropharyngeal erythema present.  Eyes:     Extraocular Movements: Extraocular  movements intact.     Conjunctiva/sclera: Conjunctivae normal.     Pupils: Pupils are equal, round, and reactive to light.  Cardiovascular:     Rate and Rhythm: Normal rate and regular rhythm.     Heart sounds: Normal heart sounds. No murmur heard. Pulmonary:     Effort: Pulmonary effort is normal. No respiratory distress.     Breath sounds: Normal breath sounds. No wheezing.  Lymphadenopathy:     Cervical: Cervical adenopathy present.  Neurological:     Mental Status: He is alert and oriented to person, place, and time.  Psychiatric:        Mood and Affect: Mood normal.        Behavior: Behavior normal.       Assessment/Plan: 1. Acute recurrent pansinusitis (Primary) Urgent ENT referral and prednisone taper for now.  - Ambulatory referral to ENT - predniSONE (STERAPRED UNI-PAK 21 TAB) 10 MG (21) TBPK tablet; Use as directed for 6 days  Dispense: 21 tablet; Refill: 0   General Counseling: James Mathews understanding of the findings of todays visit and agrees with plan of treatment. I have discussed any further diagnostic evaluation that may be needed or ordered today. We also reviewed his medications today. he has been encouraged to call the office with any questions or concerns that should arise related to todays visit.    Counseling:    Orders Placed This Encounter  Procedures   Ambulatory referral to ENT    Meds ordered this encounter  Medications   predniSONE (STERAPRED UNI-PAK 21 TAB) 10 MG (21) TBPK tablet    Sig: Use as directed for 6 days    Dispense:  21 tablet    Refill:  0    Return if symptoms worsen or fail to improve, for urgent referral for ENT.  [name] Controlled Substance Database was reviewed by me for overdose risk score (ORS)  Time spent:20 Minutes Time spent with patient included reviewing progress notes, labs, imaging studies, and discussing plan for follow up.   This patient was seen by James Kuster, FNP-C in collaboration with Dr.  Beverely Mathews as a part of collaborative care agreement.  James Bang R. Tedd Sias, MSN, FNP-C Internal Medicine

## 2023-10-10 NOTE — Telephone Encounter (Signed)
Urgent Otolaryngology referral sent via [facility] to [facility].  Notified patient. Gave telephone # [phone]

## 2023-10-13 ENCOUNTER — Encounter: Payer: Self-pay | Admitting: Nurse Practitioner

## 2023-11-16 ENCOUNTER — Telehealth: Payer: Self-pay | Admitting: Nurse Practitioner

## 2023-11-16 NOTE — Telephone Encounter (Signed)
 Otolaryngology appointment 12/01/2023 @  [facility]

## 2023-12-01 DIAGNOSIS — J329 Chronic sinusitis, unspecified: Secondary | ICD-10-CM | POA: Diagnosis not present

## 2023-12-01 DIAGNOSIS — H6063 Unspecified chronic otitis externa, bilateral: Secondary | ICD-10-CM | POA: Diagnosis not present

## 2023-12-01 DIAGNOSIS — H6123 Impacted cerumen, bilateral: Secondary | ICD-10-CM | POA: Diagnosis not present

## 2023-12-01 DIAGNOSIS — J328 Other chronic sinusitis: Secondary | ICD-10-CM | POA: Diagnosis not present

## 2023-12-01 DIAGNOSIS — J302 Other seasonal allergic rhinitis: Secondary | ICD-10-CM | POA: Diagnosis not present

## 2023-12-22 DIAGNOSIS — J310 Chronic rhinitis: Secondary | ICD-10-CM | POA: Diagnosis not present

## 2023-12-25 DIAGNOSIS — D2262 Melanocytic nevi of left upper limb, including shoulder: Secondary | ICD-10-CM | POA: Diagnosis not present

## 2023-12-25 DIAGNOSIS — Z85828 Personal history of other malignant neoplasm of skin: Secondary | ICD-10-CM | POA: Diagnosis not present

## 2023-12-25 DIAGNOSIS — D0439 Carcinoma in situ of skin of other parts of face: Secondary | ICD-10-CM | POA: Diagnosis not present

## 2023-12-25 DIAGNOSIS — D2272 Melanocytic nevi of left lower limb, including hip: Secondary | ICD-10-CM | POA: Diagnosis not present

## 2023-12-25 DIAGNOSIS — D485 Neoplasm of uncertain behavior of skin: Secondary | ICD-10-CM | POA: Diagnosis not present

## 2023-12-25 DIAGNOSIS — D225 Melanocytic nevi of trunk: Secondary | ICD-10-CM | POA: Diagnosis not present

## 2023-12-25 DIAGNOSIS — D2261 Melanocytic nevi of right upper limb, including shoulder: Secondary | ICD-10-CM | POA: Diagnosis not present

## 2023-12-25 DIAGNOSIS — L570 Actinic keratosis: Secondary | ICD-10-CM | POA: Diagnosis not present

## 2023-12-25 DIAGNOSIS — L538 Other specified erythematous conditions: Secondary | ICD-10-CM | POA: Diagnosis not present

## 2023-12-25 DIAGNOSIS — L820 Inflamed seborrheic keratosis: Secondary | ICD-10-CM | POA: Diagnosis not present

## 2024-02-02 DIAGNOSIS — J310 Chronic rhinitis: Secondary | ICD-10-CM | POA: Diagnosis not present

## 2024-02-21 ENCOUNTER — Other Ambulatory Visit: Payer: Self-pay | Admitting: Nurse Practitioner

## 2024-02-21 DIAGNOSIS — E876 Hypokalemia: Secondary | ICD-10-CM

## 2024-03-06 ENCOUNTER — Other Ambulatory Visit: Payer: Self-pay

## 2024-03-06 DIAGNOSIS — R0602 Shortness of breath: Secondary | ICD-10-CM

## 2024-03-14 ENCOUNTER — Ambulatory Visit: Payer: Medicare Other | Admitting: Nurse Practitioner

## 2024-03-14 ENCOUNTER — Encounter: Payer: Self-pay | Admitting: Nurse Practitioner

## 2024-03-14 VITALS — BP 128/68 | HR 79 | Temp 98.1°F | Resp 16 | Ht 68.0 in | Wt 190.4 lb

## 2024-03-14 DIAGNOSIS — Z0000 Encounter for general adult medical examination without abnormal findings: Secondary | ICD-10-CM | POA: Diagnosis not present

## 2024-03-14 DIAGNOSIS — L309 Dermatitis, unspecified: Secondary | ICD-10-CM | POA: Diagnosis not present

## 2024-03-14 DIAGNOSIS — E782 Mixed hyperlipidemia: Secondary | ICD-10-CM | POA: Diagnosis not present

## 2024-03-14 DIAGNOSIS — K5900 Constipation, unspecified: Secondary | ICD-10-CM

## 2024-03-14 DIAGNOSIS — I10 Essential (primary) hypertension: Secondary | ICD-10-CM | POA: Diagnosis not present

## 2024-03-14 MED ORDER — TRIAMCINOLONE ACETONIDE 0.1 % EX CREA: 1.0000 | TOPICAL_CREAM | Freq: Two times a day (BID) | CUTANEOUS | 1 refills | Status: AC

## 2024-03-14 MED ORDER — LORATADINE 10 MG PO TABS: 10.0000 mg | ORAL_TABLET | Freq: Every day | ORAL | 3 refills | Status: AC

## 2024-03-14 MED ORDER — LUBIPROSTONE 8 MCG PO CAPS: 8.0000 ug | ORAL_CAPSULE | Freq: Two times a day (BID) | ORAL | 3 refills | Status: AC

## 2024-03-14 NOTE — Progress Notes (Signed)
 [facility] [address] [place], [place] [address]  Internal MEDICINE  Office Visit Note  Patient Name: James Mathews  [id number]  [id number]  Date of Service: 03/14/2024  Chief Complaint  Patient presents with   Hyperlipidemia   Hypertension   Medicare Wellness    HPI James Mathews presents for a medicare annual wellness visit.  Well-appearing 83 y.o. male with hypertension, high cholesterol, GERD, allergic rhinitis, constipation and COPD Routine CRC screening: discontinued, aged out.  Labs: due for routine labs  New or worsening pain: none  Other concerns: Having constipation -- Tried OTC stool softener and OTC laxative.  Uses rescue inhaler almost daily.  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 Cancelled PFT for this year. Not currently on a maintenance inhaler.     03/14/2024    1:38 PM 03/14/2023    2:34 PM 03/03/2022    2:07 PM  MMSE - Mini Mental State Exam  Orientation to time 5 5 5   Orientation to Place 5 5 5   Registration 3 3 3   Attention/ Calculation 5 5 5   Recall 3 3 3   Language- name 2 objects 2 2 2   Language- repeat 1 1 1   Language- follow 3 step command 3 3 3   Language- read & follow direction 1 1 1   Write a sentence 1 1 1   Copy design 1 1 1   Total score 30 30 30     Functional Status Survey: Is the patient deaf or have difficulty hearing?: No Does the patient have difficulty seeing, even when wearing glasses/contacts?: Yes Does the patient have difficulty concentrating, remembering, or making decisions?: Yes Does the patient have difficulty walking or climbing stairs?: No Does the patient have difficulty dressing or bathing?: No Does the patient have difficulty doing errands alone such as visiting a doctor's office or shopping?: No     03/03/2022    2:04 PM 06/13/2022    1:48 PM 07/08/2022   11:44 AM 03/14/2023    2:34 PM 03/14/2024    1:37 PM  Fall Risk  Falls in the past year? 1  0 0 0  Was there an injury  with Fall? 1 1 0 0 0  Fall Risk Category Calculator 2  0 0 0  Fall Risk Category (Retired) Moderate   Low     (RETIRED) Patient Fall Risk Level Moderate fall risk   Moderate fall risk     Patient at Risk for Falls Due to    No Fall Risks No Fall Risks  Fall risk Follow up Falls evaluation completed   Falls evaluation completed  Falls evaluation completed Falls evaluation completed     Data saved with a previous flowsheet row definition       03/14/2024    1:37 PM  Depression screen PHQ 2/9  Decreased Interest 0  Down, Depressed, Hopeless 0  PHQ - 2 Score 0      Current Medication: Outpatient Encounter Medications as of 03/14/2024  Medication Sig   lubiprostone (AMITIZA) 8 MCG capsule Take 1 capsule (8 mcg total) by mouth 2 (two) times daily with a meal.   triamcinolone  cream (KENALOG ) 0.1 % Apply 1 Application topically 2 (two) times daily. To affected area until resolved.   [DISCONTINUED] loratadine (CLARITIN) 10 MG tablet Take 10 mg by mouth daily.   albuterol  (VENTOLIN  HFA) 108 (90 Base) MCG/ACT inhaler INHALE 2 PUFFS INTO THE LUNGS EVERY 6 HOURS AS NEEDED FOR WHEEZING OR SHORTNESS OF BREATH   amLODipine  (  NORVASC ) 10 MG tablet Take 10 mg by mouth daily.   ASPIRIN  81 PO Take 81 mg by mouth. 1 daily   atorvastatin (LIPITOR) 20 MG tablet Take 20 mg by mouth daily.   Calcium-Magnesium-Vitamin D 400-166.7-133.3 MG-MG-UNIT TABS Take by mouth.   fluticasone  (FLONASE ) 50 MCG/ACT nasal spray SHAKE LIQUID AND USE 2 SPRAYS IN EACH NOSTRIL DAILY   loratadine (CLARITIN) 10 MG tablet Take 1 tablet (10 mg total) by mouth daily.   montelukast  (SINGULAIR ) 10 MG tablet TAKE 1 TABLET BY MOUTH DAILY FOR CHRONIC ALLERGIC RHINITIS/ASTHMA   pantoprazole  (PROTONIX ) 40 MG tablet TAKE 1 TABLET(40 MG) BY MOUTH DAILY   potassium chloride  (KLOR-CON ) 10 MEQ tablet TAKE 1 TABLET BY MOUTH DAILY   vitamin C  (ASCORBIC ACID) 500 MG tablet Take 500 mg by mouth daily.   vitamin E 1000 UNIT capsule Take 1,000 Units  by mouth daily.   [DISCONTINUED] levofloxacin  (LEVAQUIN ) 750 MG tablet Take 1 tablet (750 mg total) by mouth daily.   [DISCONTINUED] predniSONE  (STERAPRED UNI-PAK 21 TAB) 10 MG (21) TBPK tablet Use as directed for 6 days   No facility-administered encounter medications on file as of 03/14/2024.    Surgical History: Past Surgical History:  Procedure Laterality Date   cartaract surgery both eye Bilateral    CORONARY ARTERY BYPASS GRAFT Bilateral    CYSTOSCOPY     HERNIA REPAIR     knee replacment Bilateral    LEFT ATRIAL APPENDAGE OCCLUSION  11/10/2021   retina surgery on right eye  Right 05/16/2017   TONSILLECTOMY Bilateral     Medical History: Past Medical History:  Diagnosis Date   Asthma    Atrial fibrillation (HCC)    Coronary artery disease    Hyperlipidemia    Hypertension     Family History: Family History  Problem Relation Age of Onset   Diabetes Mother    Bowel Disease Father    Alcohol abuse Father     Social History   Socioeconomic History   Marital status: Married    Spouse name: Not on file   Number of children: Not on file   Years of education: Not on file   Highest education level: Not on file  Occupational History   Not on file  Tobacco Use   Smoking status: Former   Smokeless tobacco: Never  Substance and Sexual Activity   Alcohol use: No   Drug use: No   Sexual activity: Not on file  Other Topics Concern   Not on file  Social History Narrative   Not on file   Social Drivers of Health   Financial Resource Strain: Low Risk  (03/23/2021)   Overall Financial Resource Strain (CARDIA)    Difficulty of Paying Living Expenses: Not very hard  Food Insecurity: Not on file  Transportation Needs: Not on file  Physical Activity: Not on file  Stress: Not on file  Social Connections: Not on file  Intimate Partner Violence: Not on file      Review of Systems  Constitutional:  Positive for fatigue. Negative for activity change, appetite change,  chills, fever and unexpected weight change.  HENT: Negative.  Negative for congestion, ear pain, rhinorrhea, sore throat and trouble swallowing.   Eyes: Negative.   Respiratory:  Positive for cough, chest tightness (intermittent), shortness of breath and wheezing (intermittent).   Cardiovascular: Negative.  Negative for chest pain and palpitations.  Gastrointestinal: Negative.  Negative for abdominal pain, blood in stool, constipation, diarrhea, nausea and vomiting.  Endocrine:  Negative.   Genitourinary: Negative.  Negative for difficulty urinating, dysuria, frequency, hematuria and urgency.  Musculoskeletal: Negative.  Negative for arthralgias, back pain, joint swelling, myalgias and neck pain.  Skin: Negative.  Negative for rash and wound.  Allergic/Immunologic: Negative.  Negative for immunocompromised state.  Neurological: Negative.  Negative for dizziness, seizures, numbness and headaches.  Hematological: Negative.   Psychiatric/Behavioral:  Negative for behavioral problems, self-injury, sleep disturbance and suicidal ideas. The patient is nervous/anxious.     Vital Signs: BP 128/68   Pulse 79   Temp 98.1 F (36.7 C)   Resp 16   Ht 5' 8 (1.727 m)   Wt 190 lb 6.4 oz (86.4 kg)   SpO2 96%   BMI 28.95 kg/m    Physical Exam Vitals reviewed.  Constitutional:      General: He is awake. He is not in acute distress.    Appearance: Normal appearance. He is well-developed and well-groomed. He is not ill-appearing or diaphoretic.  HENT:     Head: Normocephalic and atraumatic.     Right Ear: Tympanic membrane, ear canal and external ear normal.     Left Ear: Tympanic membrane, ear canal and external ear normal.     Nose: Nose normal. No congestion or rhinorrhea.     Mouth/Throat:     Lips: Pink.     Mouth: Mucous membranes are moist.     Pharynx: Oropharynx is clear. Uvula midline. No oropharyngeal exudate.  Eyes:     General: Lids are normal. Vision grossly intact. Gaze aligned  appropriately. No scleral icterus.       Right eye: No discharge.        Left eye: No discharge.     Conjunctiva/sclera: Conjunctivae normal.     Pupils: Pupils are equal, round, and reactive to light.     Funduscopic exam:    Right eye: Red reflex present.        Left eye: Red reflex present. Neck:     Thyroid : No thyromegaly.     Vascular: No JVD.     Trachea: Trachea and phonation normal. No tracheal deviation.  Cardiovascular:     Rate and Rhythm: Normal rate and regular rhythm.     Pulses: Normal pulses.     Heart sounds: Normal heart sounds, S1 normal and S2 normal. No murmur heard.    No friction rub. No gallop.  Pulmonary:     Effort: Pulmonary effort is normal. No accessory muscle usage or respiratory distress.     Breath sounds: Normal breath sounds and air entry. No stridor. No wheezing or rales.  Chest:     Chest wall: No tenderness.  Abdominal:     General: Bowel sounds are normal. There is no distension.     Palpations: Abdomen is soft. There is no shifting dullness, fluid wave, mass or pulsatile mass.     Tenderness: There is no abdominal tenderness. There is no guarding or rebound.  Musculoskeletal:        General: No tenderness or deformity. Normal range of motion.     Cervical back: Normal range of motion and neck supple.     Right lower leg: No edema.     Left lower leg: No edema.  Lymphadenopathy:     Cervical: No cervical adenopathy.  Skin:    General: Skin is warm and dry.     Capillary Refill: Capillary refill takes less than 2 seconds.     Coloration: Skin is not pale.  Findings: No erythema or rash.  Neurological:     Mental Status: He is alert and oriented to person, place, and time.     Cranial Nerves: No cranial nerve deficit.     Motor: No abnormal muscle tone.     Coordination: Coordination normal.     Deep Tendon Reflexes: Reflexes are normal and symmetric.  Psychiatric:        Mood and Affect: Mood normal.        Behavior: Behavior  normal. Behavior is cooperative.        Thought Content: Thought content normal.        Judgment: Judgment normal.        Assessment/Plan: 1. Encounter for subsequent annual wellness visit (AWV) in Medicare patient (Primary) Age-appropriate preventive screenings and vaccinations discussed. Routine labs for health maintenance will be ordered. PHM updated.   - loratadine (CLARITIN) 10 MG tablet; Take 1 tablet (10 mg total) by mouth daily.  Dispense: 90 tablet; Refill: 3  2. Essential hypertension Stable continue amlodipine  as prescribed.   3. Mixed hyperlipidemia Continue atorvastatin as prescribed.   5. Constipation, unspecified constipation type Amitiza prescribed for constipation. - lubiprostone (AMITIZA) 8 MCG capsule; Take 1 capsule (8 mcg total) by mouth 2 (two) times daily with a meal.  Dispense: 60 capsule; Refill: 3  6. Dermatitis Topical steroid prescribed for rash on left side  - triamcinolone  cream (KENALOG ) 0.1 %; Apply 1 Application topically 2 (two) times daily. To affected area until resolved.  Dispense: 45 g; Refill: 1      General Counseling: darril patriarca understanding of the findings of todays visit and agrees with plan of treatment. I have discussed any further diagnostic evaluation that may be needed or ordered today. We also reviewed his medications today. he has been encouraged to call the office with any questions or concerns that should arise related to todays visit.    No orders of the defined types were placed in this encounter.   Meds ordered this encounter  Medications   loratadine (CLARITIN) 10 MG tablet    Sig: Take 1 tablet (10 mg total) by mouth daily.    Dispense:  90 tablet    Refill:  3    For future refills   lubiprostone (AMITIZA) 8 MCG capsule    Sig: Take 1 capsule (8 mcg total) by mouth 2 (two) times daily with a meal.    Dispense:  60 capsule    Refill:  3    Fill new script today   triamcinolone  cream (KENALOG ) 0.1 %     Sig: Apply 1 Application topically 2 (two) times daily. To affected area until resolved.    Dispense:  45 g    Refill:  1    Fill new script today    Return in about 6 months (around 09/14/2024) for F/U, Marirose Deveney PCP.   Total time spent:30 Minutes Time spent includes review of chart, medications, test results, and follow up plan with the patient.   [place] Controlled Substance Database was reviewed by me.  This patient was seen by Mardy Maxin, FNP-C in collaboration with Dr. Sigrid Bathe as a part of collaborative care agreement.  Yoav Okane R. Maxin, MSN, FNP-C Internal medicine

## 2024-03-18 ENCOUNTER — Other Ambulatory Visit: Payer: Self-pay | Admitting: Nurse Practitioner

## 2024-03-18 DIAGNOSIS — Z125 Encounter for screening for malignant neoplasm of prostate: Secondary | ICD-10-CM | POA: Diagnosis not present

## 2024-03-18 DIAGNOSIS — E782 Mixed hyperlipidemia: Secondary | ICD-10-CM | POA: Diagnosis not present

## 2024-03-18 DIAGNOSIS — I10 Essential (primary) hypertension: Secondary | ICD-10-CM | POA: Diagnosis not present

## 2024-03-18 DIAGNOSIS — I480 Paroxysmal atrial fibrillation: Secondary | ICD-10-CM | POA: Diagnosis not present

## 2024-03-19 DIAGNOSIS — H2701 Aphakia, right eye: Secondary | ICD-10-CM | POA: Diagnosis not present

## 2024-03-19 DIAGNOSIS — H35052 Retinal neovascularization, unspecified, left eye: Secondary | ICD-10-CM | POA: Diagnosis not present

## 2024-03-19 LAB — COMPREHENSIVE METABOLIC PANEL WITH GFR
ALT: 16 IU/L (ref 0–44)
AST: 20 IU/L (ref 0–40)
Albumin: 4.6 g/dL (ref 3.7–4.7)
Alkaline Phosphatase: 68 IU/L (ref 44–121)
BUN/Creatinine Ratio: 13 (ref 10–24)
BUN: 13 mg/dL (ref 8–27)
Bilirubin Total: 0.6 mg/dL (ref 0.0–1.2)
CO2: 21 mmol/L (ref 20–29)
Calcium: 9 mg/dL (ref 8.6–10.2)
Chloride: 106 mmol/L (ref 96–106)
Creatinine, Ser: 0.99 mg/dL (ref 0.76–1.27)
Globulin, Total: 2.1 g/dL (ref 1.5–4.5)
Glucose: 119 mg/dL — ABNORMAL HIGH (ref 70–99)
Potassium: 3.9 mmol/L (ref 3.5–5.2)
Sodium: 143 mmol/L (ref 134–144)
Total Protein: 6.7 g/dL (ref 6.0–8.5)
eGFR: 75 mL/min/1.73 (ref >59)

## 2024-03-19 LAB — CBC WITH DIFFERENTIAL/PLATELET
Basophils Absolute: 0 x10E3/uL (ref 0.0–0.2)
Basos: 0 %
EOS (ABSOLUTE): 0.5 x10E3/uL — ABNORMAL HIGH (ref 0.0–0.4)
Eos: 5 %
Hematocrit: 39.1 % (ref 37.5–51.0)
Hemoglobin: 12.5 g/dL — ABNORMAL LOW (ref 13.0–17.7)
Immature Grans (Abs): 0 x10E3/uL (ref 0.0–0.1)
Immature Granulocytes: 0 %
Lymphocytes Absolute: 2.9 x10E3/uL (ref 0.7–3.1)
Lymphs: 29 %
MCH: 28.3 pg (ref 26.6–33.0)
MCHC: 32 g/dL (ref 31.5–35.7)
MCV: 89 fL (ref 79–97)
Monocytes Absolute: 0.8 x10E3/uL (ref 0.1–0.9)
Monocytes: 9 %
Neutrophils Absolute: 5.6 x10E3/uL (ref 1.4–7.0)
Neutrophils: 57 %
Platelets: 190 x10E3/uL (ref 150–450)
RBC: 4.41 x10E6/uL (ref 4.14–5.80)
RDW: 15.7 % — ABNORMAL HIGH (ref 11.6–15.4)
WBC: 9.8 x10E3/uL (ref 3.4–10.8)

## 2024-03-19 LAB — PSA, TOTAL AND FREE
PSA, Free Pct: 45 %
PSA, Free: 0.09 ng/mL
Prostate Specific Ag, Serum: 0.2 ng/mL (ref 0.0–4.0)

## 2024-03-19 LAB — LIPID PANEL
Chol/HDL Ratio: 2.8 ratio (ref 0.0–5.0)
Cholesterol, Total: 113 mg/dL (ref 100–199)
HDL: 40 mg/dL (ref >39)
LDL Chol Calc (NIH): 58 mg/dL (ref 0–99)
Triglycerides: 72 mg/dL (ref 0–149)
VLDL Cholesterol Cal: 15 mg/dL (ref 5–40)

## 2024-03-20 ENCOUNTER — Encounter: Payer: Medicare Other | Admitting: Internal Medicine

## 2024-03-28 ENCOUNTER — Ambulatory Visit: Payer: Medicare Other | Admitting: Physician Assistant

## 2024-04-02 ENCOUNTER — Ambulatory Visit: Admitting: Internal Medicine

## 2024-04-08 DIAGNOSIS — H31012 Macula scars of posterior pole (postinflammatory) (post-traumatic), left eye: Secondary | ICD-10-CM | POA: Diagnosis not present

## 2024-04-08 DIAGNOSIS — H2701 Aphakia, right eye: Secondary | ICD-10-CM | POA: Diagnosis not present

## 2024-04-08 DIAGNOSIS — H353221 Exudative age-related macular degeneration, left eye, with active choroidal neovascularization: Secondary | ICD-10-CM | POA: Diagnosis not present

## 2024-04-08 DIAGNOSIS — Z8669 Personal history of other diseases of the nervous system and sense organs: Secondary | ICD-10-CM | POA: Diagnosis not present

## 2024-04-09 ENCOUNTER — Other Ambulatory Visit: Payer: Self-pay | Admitting: Nurse Practitioner

## 2024-04-09 DIAGNOSIS — Z760 Encounter for issue of repeat prescription: Secondary | ICD-10-CM

## 2024-04-09 MED ORDER — BREZTRI AEROSPHERE 160-9-4.8 MCG/ACT IN AERO
2.0000 | INHALATION_SPRAY | Freq: Two times a day (BID) | RESPIRATORY_TRACT | 11 refills | Status: DC
Start: 2024-04-09 — End: 2024-07-05

## 2024-04-09 MED ORDER — PANTOPRAZOLE SODIUM 40 MG PO TBEC: 40.0000 mg | DELAYED_RELEASE_TABLET | Freq: Every day | ORAL | 3 refills | Status: AC

## 2024-04-28 ENCOUNTER — Encounter: Payer: Self-pay | Admitting: Nurse Practitioner

## 2024-05-13 ENCOUNTER — Encounter: Payer: Self-pay | Admitting: Nurse Practitioner

## 2024-05-13 DIAGNOSIS — R053 Chronic cough: Secondary | ICD-10-CM

## 2024-05-13 DIAGNOSIS — R062 Wheezing: Secondary | ICD-10-CM

## 2024-05-13 DIAGNOSIS — R0602 Shortness of breath: Secondary | ICD-10-CM

## 2024-05-14 ENCOUNTER — Ambulatory Visit
Admission: RE | Admit: 2024-05-14 | Discharge: 2024-05-14 | Disposition: A | Discharge status: Home / Self Care | Attending: Nurse Practitioner | Admitting: Nurse Practitioner

## 2024-05-14 ENCOUNTER — Ambulatory Visit
Admission: RE | Admit: 2024-05-14 | Discharge: 2024-05-14 | Disposition: A | Discharge status: Home / Self Care | Source: Ambulatory Visit | Attending: Nurse Practitioner | Admitting: Nurse Practitioner

## 2024-05-14 DIAGNOSIS — R062 Wheezing: Secondary | ICD-10-CM | POA: Insufficient documentation

## 2024-05-14 DIAGNOSIS — H353221 Exudative age-related macular degeneration, left eye, with active choroidal neovascularization: Secondary | ICD-10-CM | POA: Diagnosis not present

## 2024-05-14 DIAGNOSIS — R059 Cough, unspecified: Secondary | ICD-10-CM | POA: Diagnosis not present

## 2024-05-14 DIAGNOSIS — R0602 Shortness of breath: Secondary | ICD-10-CM | POA: Insufficient documentation

## 2024-05-14 DIAGNOSIS — R053 Chronic cough: Secondary | ICD-10-CM | POA: Diagnosis not present

## 2024-05-14 DIAGNOSIS — I771 Stricture of artery: Secondary | ICD-10-CM | POA: Diagnosis not present

## 2024-06-06 ENCOUNTER — Encounter: Payer: Self-pay | Admitting: Nurse Practitioner

## 2024-06-06 ENCOUNTER — Telehealth (INDEPENDENT_AMBULATORY_CARE_PROVIDER_SITE_OTHER): Admitting: Nurse Practitioner

## 2024-06-06 VITALS — Ht 68.0 in | Wt 190.0 lb

## 2024-06-06 DIAGNOSIS — B9689 Other specified bacterial agents as the cause of diseases classified elsewhere: Secondary | ICD-10-CM

## 2024-06-06 DIAGNOSIS — R051 Acute cough: Secondary | ICD-10-CM

## 2024-06-06 DIAGNOSIS — J208 Acute bronchitis due to other specified organisms: Secondary | ICD-10-CM | POA: Diagnosis not present

## 2024-06-06 DIAGNOSIS — R062 Wheezing: Secondary | ICD-10-CM

## 2024-06-06 MED ORDER — PROMETHAZINE-CODEINE 6.25-10 MG/5ML PO SYRP: 5.0000 mL | ORAL_SOLUTION | Freq: Four times a day (QID) | ORAL | 0 refills | Status: AC | PRN

## 2024-06-06 MED ORDER — PREDNISONE 10 MG (21) PO TBPK: ORAL_TABLET | ORAL | 0 refills | Status: DC

## 2024-06-06 MED ORDER — LEVOFLOXACIN 750 MG PO TABS: 750.0000 mg | ORAL_TABLET | Freq: Every day | ORAL | 0 refills | Status: DC

## 2024-06-06 NOTE — Progress Notes (Signed)
 [facility] [address] [place], [place] [address]  Internal MEDICINE  Telephone Visit  Patient Name: James Mathews  [id number]  [id number]  Date of Service: 06/06/2024  I connected with the patient at 1235 by telephone and verified the patients identity using two identifiers.   I discussed the limitations, risks, security and privacy concerns of performing an evaluation and management service by telephone and the availability of in person appointments. I also discussed with the patient that there may be a patient responsible charge related to the service.  The patient expressed understanding and agrees to proceed.    Chief Complaint  Patient presents with   Telephone Screen   Telephone Assessment   Cough    And wheezing and its going on for 1 week   Sinusitis    HPI James Mathews presents for a telehealth virtual visit for symptoms of sinusitis. Onset of symptoms was 1 week ago Negative for covid Reports cough, SOB, wheezing, sinus drainage, nasal congestion, headache, fatigue.   Current Medication: Outpatient Encounter Medications as of 06/06/2024  Medication Sig   albuterol  (VENTOLIN  HFA) 108 (90 Base) MCG/ACT inhaler INHALE 2 PUFFS INTO THE LUNGS EVERY 6 HOURS AS NEEDED FOR WHEEZING OR SHORTNESS OF BREATH   amLODipine  (NORVASC ) 10 MG tablet Take 10 mg by mouth daily.   ASPIRIN  81 PO Take 81 mg by mouth. 1 daily   atorvastatin (LIPITOR) 20 MG tablet Take 20 mg by mouth daily.   budesonide-glycopyrrolate-formoterol (BREZTRI  AEROSPHERE) 160-9-4.8 MCG/ACT AERO inhaler Inhale 2 puffs into the lungs 2 (two) times daily.   Calcium-Magnesium-Vitamin D 400-166.7-133.3 MG-MG-UNIT TABS Take by mouth.   fluticasone  (FLONASE ) 50 MCG/ACT nasal spray SHAKE LIQUID AND USE 2 SPRAYS IN EACH NOSTRIL DAILY   levofloxacin  (LEVAQUIN ) 750 MG tablet Take 1 tablet (750 mg total) by mouth daily. Take with food   loratadine  (CLARITIN ) 10 MG tablet Take 1 tablet (10 mg total) by mouth  daily.   lubiprostone  (AMITIZA ) 8 MCG capsule Take 1 capsule (8 mcg total) by mouth 2 (two) times daily with a meal.   montelukast  (SINGULAIR ) 10 MG tablet TAKE 1 TABLET BY MOUTH DAILY FOR CHRONIC ALLERGIC RHINITIS/ASTHMA   pantoprazole  (PROTONIX ) 40 MG tablet Take 1 tablet (40 mg total) by mouth daily.   potassium chloride  (KLOR-CON ) 10 MEQ tablet TAKE 1 TABLET BY MOUTH DAILY   predniSONE  (STERAPRED UNI-PAK 21 TAB) 10 MG (21) TBPK tablet Use as directed for 6 days   promethazine -codeine  (PHENERGAN  WITH CODEINE ) 6.25-10 MG/5ML syrup Take 5 mLs by mouth every 6 (six) hours as needed for cough.   triamcinolone  cream (KENALOG ) 0.1 % Apply 1 Application topically 2 (two) times daily. To affected area until resolved.   vitamin C  (ASCORBIC ACID) 500 MG tablet Take 500 mg by mouth daily.   vitamin E 1000 UNIT capsule Take 1,000 Units by mouth daily.   No facility-administered encounter medications on file as of 06/06/2024.    Surgical History: Past Surgical History:  Procedure Laterality Date   cartaract surgery both eye Bilateral    CORONARY ARTERY BYPASS GRAFT Bilateral    CYSTOSCOPY     HERNIA REPAIR     knee replacment Bilateral    LEFT ATRIAL APPENDAGE OCCLUSION  11/10/2021   retina surgery on right eye  Right 05/16/2017   TONSILLECTOMY Bilateral     Medical History: Past Medical History:  Diagnosis Date   Asthma    Atrial fibrillation (HCC)    Coronary artery disease    Hyperlipidemia  Hypertension     Family History: Family History  Problem Relation Age of Onset   Diabetes Mother    Bowel Disease Father    Alcohol abuse Father     Social History   Socioeconomic History   Marital status: Married    Spouse name: Not on file   Number of children: Not on file   Years of education: Not on file   Highest education level: Not on file  Occupational History   Not on file  Tobacco Use   Smoking status: Former   Smokeless tobacco: Never  Substance and Sexual Activity    Alcohol use: No   Drug use: No   Sexual activity: Not on file  Other Topics Concern   Not on file  Social History Narrative   Not on file   Social Drivers of Health   Financial Resource Strain: Low Risk  (03/23/2021)   Overall Financial Resource Strain (CARDIA)    Difficulty of Paying Living Expenses: Not very hard  Food Insecurity: Not on file  Transportation Needs: Not on file  Physical Activity: Not on file  Stress: Not on file  Social Connections: Not on file  Intimate Partner Violence: Not on file      Review of Systems  Constitutional:  Positive for fatigue. Negative for chills and fever.  HENT:  Positive for congestion, postnasal drip, rhinorrhea, sinus pressure and sinus pain. Negative for sore throat and trouble swallowing.   Respiratory:  Positive for cough, chest tightness, shortness of breath and wheezing.   Cardiovascular: Negative.  Negative for chest pain and palpitations.  Neurological:  Positive for headaches.    Vital Signs: Ht 5' 8 (1.727 m)   Wt 190 lb (86.2 kg)   BMI 28.89 kg/m    Observation/Objective: He is alert and oriented. No acute distress noted.     Assessment/Plan: 1. Acute bacterial bronchitis (Primary) Levofloxacin  and prednisone  prescribed, take until gone  - levofloxacin  (LEVAQUIN ) 750 MG tablet; Take 1 tablet (750 mg total) by mouth daily. Take with food  Dispense: 7 tablet; Refill: 0 - predniSONE  (STERAPRED UNI-PAK 21 TAB) 10 MG (21) TBPK tablet; Use as directed for 6 days  Dispense: 21 tablet; Refill: 0  2. Acute cough Take cough syrup as needed as prescribed.  - promethazine -codeine  (PHENERGAN  WITH CODEINE ) 6.25-10 MG/5ML syrup; Take 5 mLs by mouth every 6 (six) hours as needed for cough.  Dispense: 120 mL; Refill: 0  3. Wheezing Take prednisone  as prescribed until gone  - predniSONE  (STERAPRED UNI-PAK 21 TAB) 10 MG (21) TBPK tablet; Use as directed for 6 days  Dispense: 21 tablet; Refill: 0   General Counseling:  sanjeev main understanding of the findings of today's phone visit and agrees with plan of treatment. I have discussed any further diagnostic evaluation that may be needed or ordered today. We also reviewed his medications today. he has been encouraged to call the office with any questions or concerns that should arise related to todays visit.  Return if symptoms worsen or fail to improve.   No orders of the defined types were placed in this encounter.   Meds ordered this encounter  Medications   levofloxacin  (LEVAQUIN ) 750 MG tablet    Sig: Take 1 tablet (750 mg total) by mouth daily. Take with food    Dispense:  7 tablet    Refill:  0    Fill new script today   predniSONE  (STERAPRED UNI-PAK 21 TAB) 10 MG (21) TBPK tablet  Sig: Use as directed for 6 days    Dispense:  21 tablet    Refill:  0   promethazine -codeine  (PHENERGAN  WITH CODEINE ) 6.25-10 MG/5ML syrup    Sig: Take 5 mLs by mouth every 6 (six) hours as needed for cough.    Dispense:  120 mL    Refill:  0    Fill new script today    Time spent:20 Minutes Time spent with patient included reviewing progress notes, labs, imaging studies, and discussing plan for follow up.  [place] Controlled Substance Database was reviewed by me for overdose risk score (ORS) if appropriate.  This patient was seen by Mardy Maxin, FNP-C in collaboration with Dr. Sigrid Bathe as a part of collaborative care agreement.  Aarian Cleaver R. Maxin, MSN, FNP-C Internal medicine

## 2024-06-14 DIAGNOSIS — I351 Nonrheumatic aortic (valve) insufficiency: Secondary | ICD-10-CM | POA: Diagnosis not present

## 2024-06-14 DIAGNOSIS — I493 Ventricular premature depolarization: Secondary | ICD-10-CM | POA: Diagnosis not present

## 2024-06-14 DIAGNOSIS — E782 Mixed hyperlipidemia: Secondary | ICD-10-CM | POA: Diagnosis not present

## 2024-06-14 DIAGNOSIS — I714 Abdominal aortic aneurysm, without rupture, unspecified: Secondary | ICD-10-CM | POA: Diagnosis not present

## 2024-06-14 DIAGNOSIS — I25119 Atherosclerotic heart disease of native coronary artery with unspecified angina pectoris: Secondary | ICD-10-CM | POA: Diagnosis not present

## 2024-06-14 DIAGNOSIS — Z95818 Presence of other cardiac implants and grafts: Secondary | ICD-10-CM | POA: Diagnosis not present

## 2024-06-14 DIAGNOSIS — I10 Essential (primary) hypertension: Secondary | ICD-10-CM | POA: Diagnosis not present

## 2024-06-14 DIAGNOSIS — I4819 Other persistent atrial fibrillation: Secondary | ICD-10-CM | POA: Diagnosis not present

## 2024-06-17 ENCOUNTER — Encounter: Payer: Self-pay | Admitting: Nurse Practitioner

## 2024-06-18 DIAGNOSIS — H353221 Exudative age-related macular degeneration, left eye, with active choroidal neovascularization: Secondary | ICD-10-CM | POA: Diagnosis not present

## 2024-06-21 DIAGNOSIS — Z23 Encounter for immunization: Secondary | ICD-10-CM | POA: Diagnosis not present

## 2024-07-05 ENCOUNTER — Ambulatory Visit (INDEPENDENT_AMBULATORY_CARE_PROVIDER_SITE_OTHER): Admitting: Nurse Practitioner

## 2024-07-05 ENCOUNTER — Ambulatory Visit
Admission: RE | Admit: 2024-07-05 | Discharge: 2024-07-05 | Disposition: A | Discharge status: Home / Self Care | Source: Ambulatory Visit | Attending: Nurse Practitioner | Admitting: Nurse Practitioner

## 2024-07-05 ENCOUNTER — Encounter: Payer: Self-pay | Admitting: Nurse Practitioner

## 2024-07-05 VITALS — BP 110/70 | HR 72 | Temp 96.0°F | Resp 16 | Ht 68.0 in | Wt 189.8 lb

## 2024-07-05 DIAGNOSIS — R0789 Other chest pain: Secondary | ICD-10-CM | POA: Insufficient documentation

## 2024-07-05 DIAGNOSIS — R052 Subacute cough: Secondary | ICD-10-CM | POA: Insufficient documentation

## 2024-07-05 DIAGNOSIS — R062 Wheezing: Secondary | ICD-10-CM | POA: Diagnosis not present

## 2024-07-05 DIAGNOSIS — R0602 Shortness of breath: Secondary | ICD-10-CM | POA: Diagnosis not present

## 2024-07-05 DIAGNOSIS — J479 Bronchiectasis, uncomplicated: Secondary | ICD-10-CM | POA: Diagnosis not present

## 2024-07-05 DIAGNOSIS — I719 Aortic aneurysm of unspecified site, without rupture: Secondary | ICD-10-CM

## 2024-07-05 DIAGNOSIS — R0989 Other specified symptoms and signs involving the circulatory and respiratory systems: Secondary | ICD-10-CM | POA: Diagnosis not present

## 2024-07-05 DIAGNOSIS — J189 Pneumonia, unspecified organism: Secondary | ICD-10-CM | POA: Diagnosis not present

## 2024-07-05 DIAGNOSIS — I700 Atherosclerosis of aorta: Secondary | ICD-10-CM | POA: Diagnosis not present

## 2024-07-05 MED ORDER — CLARITHROMYCIN 500 MG PO TABS: 500.0000 mg | ORAL_TABLET | Freq: Two times a day (BID) | ORAL | 0 refills | Status: AC

## 2024-07-05 MED ORDER — PREDNISONE 10 MG (21) PO TBPK: ORAL_TABLET | ORAL | 0 refills | Status: DC

## 2024-07-05 MED ORDER — AMOXICILLIN-POT CLAVULANATE 875-125 MG PO TABS: 1.0000 | ORAL_TABLET | Freq: Two times a day (BID) | ORAL | 0 refills | Status: AC

## 2024-07-05 MED ORDER — BENZONATATE 200 MG PO CAPS
200.0000 mg | ORAL_CAPSULE | Freq: Two times a day (BID) | ORAL | 0 refills | Status: AC | PRN
Start: 2024-07-05 — End: ?

## 2024-07-05 MED ORDER — IPRATROPIUM-ALBUTEROL 0.5-2.5 (3) MG/3ML IN SOLN: 3.0000 mL | RESPIRATORY_TRACT | 1 refills | Status: DC | PRN

## 2024-07-05 NOTE — Progress Notes (Signed)
 [facility] [address] [place], [place] [address]  Internal MEDICINE  Office Visit Note  Patient Name: James Mathews  [id number]  [id number]  Date of Service: 07/05/2024  Chief Complaint  Patient presents with   Acute Visit    Feel bad, difficulty breathing.      HPI James Mathews presents for an acute sick visit for possible respiratory illness.  patient was treated for possible pneumonia last month with levofloxacin  and prednisone  but this has not resolved. The patient states that he feels worse now than he did in September when he symptoms started.  His chest xray last month showed hyperexpanded lungs but no other abnormalities.  He reports continued cough, SOB, wheezing, chest tightness. He denies any hemoptysis.     Current Medication:  Outpatient Encounter Medications as of 07/05/2024  Medication Sig   amoxicillin -clavulanate (AUGMENTIN ) 875-125 MG tablet Take 1 tablet by mouth 2 (two) times daily for 7 days. Take with food   benzonatate  (TESSALON ) 200 MG capsule Take 1 capsule (200 mg total) by mouth 2 (two) times daily as needed.   clarithromycin (BIAXIN) 500 MG tablet Take 1 tablet (500 mg total) by mouth 2 (two) times daily for 7 days. Take with food   ipratropium-albuterol  (DUONEB) 0.5-2.5 (3) MG/3ML SOLN Take 3 mLs by nebulization every 4 (four) hours as needed.   albuterol  (VENTOLIN  HFA) 108 (90 Base) MCG/ACT inhaler INHALE 2 PUFFS INTO THE LUNGS EVERY 6 HOURS AS NEEDED FOR WHEEZING OR SHORTNESS OF BREATH   amLODipine  (NORVASC ) 10 MG tablet Take 10 mg by mouth daily.   ASPIRIN  81 PO Take 81 mg by mouth. 1 daily   atorvastatin (LIPITOR) 20 MG tablet Take 20 mg by mouth daily.   Calcium-Magnesium-Vitamin D 400-166.7-133.3 MG-MG-UNIT TABS Take by mouth.   fluticasone  (FLONASE ) 50 MCG/ACT nasal spray SHAKE LIQUID AND USE 2 SPRAYS IN EACH NOSTRIL DAILY (Patient not taking: Reported on 07/05/2024)   levofloxacin  (LEVAQUIN ) 750 MG tablet Take 1 tablet (750 mg  total) by mouth daily. Take with food (Patient not taking: Reported on 07/05/2024)   loratadine  (CLARITIN ) 10 MG tablet Take 1 tablet (10 mg total) by mouth daily.   lubiprostone  (AMITIZA ) 8 MCG capsule Take 1 capsule (8 mcg total) by mouth 2 (two) times daily with a meal. (Patient not taking: Reported on 07/05/2024)   montelukast  (SINGULAIR ) 10 MG tablet TAKE 1 TABLET BY MOUTH DAILY FOR CHRONIC ALLERGIC RHINITIS/ASTHMA   pantoprazole  (PROTONIX ) 40 MG tablet Take 1 tablet (40 mg total) by mouth daily.   potassium chloride  (KLOR-CON ) 10 MEQ tablet TAKE 1 TABLET BY MOUTH DAILY   predniSONE  (STERAPRED UNI-PAK 21 TAB) 10 MG (21) TBPK tablet Use as directed for 6 days   promethazine -codeine  (PHENERGAN  WITH CODEINE ) 6.25-10 MG/5ML syrup Take 5 mLs by mouth every 6 (six) hours as needed for cough. (Patient not taking: Reported on 07/05/2024)   triamcinolone  cream (KENALOG ) 0.1 % Apply 1 Application topically 2 (two) times daily. To affected area until resolved. (Patient not taking: Reported on 07/05/2024)   vitamin C  (ASCORBIC ACID) 500 MG tablet Take 500 mg by mouth daily.   vitamin E 1000 UNIT capsule Take 1,000 Units by mouth daily.   [DISCONTINUED] budesonide-glycopyrrolate-formoterol (BREZTRI  AEROSPHERE) 160-9-4.8 MCG/ACT AERO inhaler Inhale 2 puffs into the lungs 2 (two) times daily. (Patient not taking: Reported on 07/05/2024)   [DISCONTINUED] predniSONE  (STERAPRED UNI-PAK 21 TAB) 10 MG (21) TBPK tablet Use as directed for 6 days (Patient not taking: Reported on 07/05/2024)  No facility-administered encounter medications on file as of 07/05/2024.      Medical History: Past Medical History:  Diagnosis Date   Asthma    Atrial fibrillation (HCC)    Coronary artery disease    Hyperlipidemia    Hypertension      Vital Signs: BP 110/70   Pulse 72   Temp (!) 96 F (35.6 C)   Resp 16   Ht 5' 8 (1.727 m)   Wt 189 lb 12.8 oz (86.1 kg)   SpO2 96%   BMI 28.86 kg/m    Review of  Systems  Constitutional:  Positive for activity change, appetite change and fatigue. Negative for chills and fever.  HENT:  Positive for postnasal drip, rhinorrhea and sore throat (due to cough). Negative for sinus pressure and sinus pain.   Respiratory:  Positive for cough, chest tightness, shortness of breath and wheezing.   Cardiovascular: Negative.  Negative for chest pain and palpitations.  Gastrointestinal: Negative.   Musculoskeletal:  Positive for myalgias.  Neurological:  Positive for weakness.  Psychiatric/Behavioral:  Positive for sleep disturbance (due to SOB and coughing).     Physical Exam Pulmonary:     Effort: No accessory muscle usage or respiratory distress.     Breath sounds: Decreased air movement present. Examination of the right-upper field reveals wheezing and rales. Examination of the left-upper field reveals wheezing and rales. Examination of the left-middle field reveals rales. Examination of the right-lower field reveals decreased breath sounds and rales. Examination of the left-lower field reveals rales. Decreased breath sounds, wheezing and rales present.       Assessment/Plan: 1. Community acquired bilateral lower lobe pneumonia (Primary) Stat CT chest ordered. Prednisone  and dual therapy antibiotic prescribed with beta-lactam and macrolide for 7 days. Duoneb treatment prescribed. For his neb machine.  - CT Chest Wo Contrast; Future - predniSONE  (STERAPRED UNI-PAK 21 TAB) 10 MG (21) TBPK tablet; Use as directed for 6 days  Dispense: 21 tablet; Refill: 0 - benzonatate  (TESSALON ) 200 MG capsule; Take 1 capsule (200 mg total) by mouth 2 (two) times daily as needed.  Dispense: 30 capsule; Refill: 0 - clarithromycin (BIAXIN) 500 MG tablet; Take 1 tablet (500 mg total) by mouth 2 (two) times daily for 7 days. Take with food  Dispense: 14 tablet; Refill: 0 - amoxicillin -clavulanate (AUGMENTIN ) 875-125 MG tablet; Take 1 tablet by mouth 2 (two) times daily for 7 days.  Take with food  Dispense: 14 tablet; Refill: 0 - ipratropium-albuterol  (DUONEB) 0.5-2.5 (3) MG/3ML SOLN; Take 3 mLs by nebulization every 4 (four) hours as needed.  Dispense: 360 mL; Refill: 1  2. Aortic aneurysm without rupture, unspecified portion of aorta Stat CT chest ordered  - CT Chest Wo Contrast; Future  3. Bilateral rales Stati CT chest ordered  - CT Chest Wo Contrast; Future  4. SOB (shortness of breath) CT chest ordered stat  - CT Chest Wo Contrast; Future - ipratropium-albuterol  (DUONEB) 0.5-2.5 (3) MG/3ML SOLN; Take 3 mLs by nebulization every 4 (four) hours as needed.  Dispense: 360 mL; Refill: 1  5. Wheezing Stat CT chest ordered, take prednisone  until gone  - CT Chest Wo Contrast; Future - predniSONE  (STERAPRED UNI-PAK 21 TAB) 10 MG (21) TBPK tablet; Use as directed for 6 days  Dispense: 21 tablet; Refill: 0 - ipratropium-albuterol  (DUONEB) 0.5-2.5 (3) MG/3ML SOLN; Take 3 mLs by nebulization every 4 (four) hours as needed.  Dispense: 360 mL; Refill: 1  6. Subacute cough Stat CT chest ordered. Cough medication  prescribed.  - CT Chest Wo Contrast; Future - benzonatate  (TESSALON ) 200 MG capsule; Take 1 capsule (200 mg total) by mouth 2 (two) times daily as needed.  Dispense: 30 capsule; Refill: 0  7. Chest tightness CT chest stat ordered. Prednisone  taper prescribed, take until gone.  - CT Chest Wo Contrast; Future - predniSONE  (STERAPRED UNI-PAK 21 TAB) 10 MG (21) TBPK tablet; Use as directed for 6 days  Dispense: 21 tablet; Refill: 0 - ipratropium-albuterol  (DUONEB) 0.5-2.5 (3) MG/3ML SOLN; Take 3 mLs by nebulization every 4 (four) hours as needed.  Dispense: 360 mL; Refill: 1   General Counseling: briley sulton understanding of the findings of todays visit and agrees with plan of treatment. I have discussed any further diagnostic evaluation that may be needed or ordered today. We also reviewed his medications today. he has been encouraged to call the office  with any questions or concerns that should arise related to todays visit.    Counseling:    Orders Placed This Encounter  Procedures   CT Chest Wo Contrast    Meds ordered this encounter  Medications   predniSONE  (STERAPRED UNI-PAK 21 TAB) 10 MG (21) TBPK tablet    Sig: Use as directed for 6 days    Dispense:  21 tablet    Refill:  0   benzonatate  (TESSALON ) 200 MG capsule    Sig: Take 1 capsule (200 mg total) by mouth 2 (two) times daily as needed.    Dispense:  30 capsule    Refill:  0   clarithromycin (BIAXIN) 500 MG tablet    Sig: Take 1 tablet (500 mg total) by mouth 2 (two) times daily for 7 days. Take with food    Dispense:  14 tablet    Refill:  0    Fill new script today   amoxicillin -clavulanate (AUGMENTIN ) 875-125 MG tablet    Sig: Take 1 tablet by mouth 2 (two) times daily for 7 days. Take with food    Dispense:  14 tablet    Refill:  0    Fill new script today.   ipratropium-albuterol  (DUONEB) 0.5-2.5 (3) MG/3ML SOLN    Sig: Take 3 mLs by nebulization every 4 (four) hours as needed.    Dispense:  360 mL    Refill:  1    Fill new script today    Return if symptoms worsen or fail to improve.  [place] Controlled Substance Database was reviewed by me for overdose risk score (ORS)  Time spent:30 Minutes Time spent with patient included reviewing progress notes, labs, imaging studies, and discussing plan for follow up.   This patient was seen by Mardy Maxin, FNP-C in collaboration with Dr. Sigrid Bathe as a part of collaborative care agreement.  Jazmyn Offner R. Maxin, MSN, FNP-C Internal Medicine

## 2024-07-08 ENCOUNTER — Ambulatory Visit (INDEPENDENT_AMBULATORY_CARE_PROVIDER_SITE_OTHER): Admitting: Internal Medicine

## 2024-07-08 VITALS — BP 126/70 | HR 70 | Temp 98.4°F | Resp 16 | Ht 68.0 in | Wt 195.0 lb

## 2024-07-08 DIAGNOSIS — I2583 Coronary atherosclerosis due to lipid rich plaque: Secondary | ICD-10-CM

## 2024-07-08 DIAGNOSIS — J4489 Other specified chronic obstructive pulmonary disease: Secondary | ICD-10-CM

## 2024-07-08 DIAGNOSIS — I2510 Atherosclerotic heart disease of native coronary artery without angina pectoris: Secondary | ICD-10-CM | POA: Diagnosis not present

## 2024-07-08 DIAGNOSIS — R052 Subacute cough: Secondary | ICD-10-CM | POA: Diagnosis not present

## 2024-07-08 DIAGNOSIS — R0602 Shortness of breath: Secondary | ICD-10-CM

## 2024-07-08 NOTE — Progress Notes (Signed)
 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 He had been placed on breztri  and this did not help. He is off the breztri  and states that he had a bad response to it. Currently is on albuterol . Patient has been getting Ct chest regularly for aortic surveillance. Patient has done well. As far as the breathing he is on albuterol  alone since he did not tolerate the breztri . He states he feels fine with it. Patient has noted cough or congestion. He notes shortness of breath up stairs. Patient has no fevers or chills.   Office Spirometry Results:     ROS  General: (-) fever, (-) chills, (-) night sweats, (-) weakness Skin: (-) rashes, (-) itching,. Eyes: (-) visual changes, (-) redness, (-) itching. Nose and Sinuses: (-) nasal stuffiness or itchiness, (-) postnasal drip, (-) nosebleeds, (-) sinus trouble. Mouth and Throat: (-) sore throat, (-) hoarseness. Neck: (-) swollen glands, (-) enlarged thyroid , (-) neck pain. Respiratory: + cough, (-) bloody sputum, + shortness of breath, - wheezing. Cardiovascular: - ankle swelling, (-) chest pain. Lymphatic: (-) lymph node enlargement. Neurologic: (-) numbness, (-) tingling. Psychiatric: (-) anxiety, (-) depression   Current Medication: Outpatient Encounter Medications as of 07/08/2024  Medication Sig   albuterol  (VENTOLIN  HFA) 108 (90 Base) MCG/ACT inhaler INHALE 2 PUFFS INTO THE LUNGS EVERY 6 HOURS AS NEEDED FOR WHEEZING OR SHORTNESS OF BREATH   amLODipine  (NORVASC ) 10 MG tablet Take 10 mg by mouth daily.   amoxicillin -clavulanate (AUGMENTIN ) 875-125 MG tablet Take 1 tablet by mouth 2 (two) times daily for 7 days. Take with food    ASPIRIN  81 PO Take 81 mg by mouth. 1 daily   atorvastatin (LIPITOR) 20 MG tablet Take 20 mg by mouth daily.   benzonatate  (TESSALON ) 200 MG capsule Take 1 capsule (200 mg total) by mouth 2 (two) times daily as needed.   Calcium-Magnesium-Vitamin D 400-166.7-133.3 MG-MG-UNIT TABS Take by mouth.   clarithromycin (BIAXIN) 500 MG tablet Take 1 tablet (500 mg total) by mouth 2 (two) times daily for 7 days. Take with food   fluticasone  (FLONASE ) 50 MCG/ACT nasal spray SHAKE LIQUID AND USE 2 SPRAYS IN EACH NOSTRIL DAILY   ipratropium-albuterol  (DUONEB) 0.5-2.5 (3) MG/3ML SOLN Take 3 mLs by nebulization every 4 (four) hours as needed.   levofloxacin  (LEVAQUIN ) 750 MG tablet Take 1 tablet (750 mg total) by mouth daily. Take with food   loratadine  (CLARITIN ) 10 MG tablet Take 1 tablet (10 mg total) by mouth daily.   lubiprostone  (AMITIZA ) 8 MCG capsule Take 1 capsule (8 mcg total) by mouth 2 (two) times daily with a meal.   montelukast  (SINGULAIR ) 10 MG tablet TAKE 1 TABLET BY MOUTH DAILY FOR CHRONIC ALLERGIC RHINITIS/ASTHMA   pantoprazole  (PROTONIX ) 40 MG tablet Take 1 tablet (40 mg total) by mouth daily.   potassium chloride  (KLOR-CON ) 10 MEQ tablet TAKE 1 TABLET BY MOUTH DAILY   predniSONE  (STERAPRED UNI-PAK 21 TAB) 10 MG (21) TBPK tablet Use as directed for 6 days   promethazine -codeine  (PHENERGAN  WITH CODEINE ) 6.25-10 MG/5ML syrup Take 5 mLs by mouth every 6 (six) hours as needed for cough.   triamcinolone  cream (KENALOG ) 0.1 % Apply 1 Application topically 2 (  two) times daily. To affected area until resolved.   vitamin C  (ASCORBIC ACID) 500 MG tablet Take 500 mg by mouth daily.   vitamin E 1000 UNIT capsule Take 1,000 Units by mouth daily.   No facility-administered encounter medications on file as of 07/08/2024.    Surgical History: Past Surgical History:  Procedure Laterality Date   cartaract surgery both eye Bilateral    CORONARY ARTERY BYPASS GRAFT Bilateral    CYSTOSCOPY     HERNIA  REPAIR     knee replacment Bilateral    LEFT ATRIAL APPENDAGE OCCLUSION  11/10/2021   retina surgery on right eye  Right 05/16/2017   TONSILLECTOMY Bilateral     Medical History: Past Medical History:  Diagnosis Date   Asthma    Atrial fibrillation (HCC)    Coronary artery disease    Hyperlipidemia    Hypertension     Family History: Family History  Problem Relation Age of Onset   Diabetes Mother    Bowel Disease Father    Alcohol abuse Father     Social History: Social History   Socioeconomic History   Marital status: Married    Spouse name: Not on file   Number of children: Not on file   Years of education: Not on file   Highest education level: Not on file  Occupational History   Not on file  Tobacco Use   Smoking status: Former    Passive exposure: Past   Smokeless tobacco: Never  Substance and Sexual Activity   Alcohol use: No   Drug use: No   Sexual activity: Not on file  Other Topics Concern   Not on file  Social History Narrative   Not on file   Social Drivers of Health   Financial Resource Strain: Low Risk  (03/23/2021)   Overall Financial Resource Strain (CARDIA)    Difficulty of Paying Living Expenses: Not very hard  Food Insecurity: Not on file  Transportation Needs: Not on file  Physical Activity: Not on file  Stress: Not on file  Social Connections: Not on file  Intimate Partner Violence: Not on file    Vital Signs: Blood pressure 126/70, pulse 70, temperature 98.4 F (36.9 C), resp. rate 16, height 5' 8 (1.727 m), weight 195 lb (88.5 kg), SpO2 97%.  Examination: General Appearance: The patient is well-developed, well-nourished, and in no distress. Skin: Gross inspection of skin unremarkable. Head: normocephalic, no gross deformities. Eyes: no gross deformities noted. ENT: ears appear grossly normal no exudates. Neck: Supple. No thyromegaly. No LAD. Respiratory: few rhonchi noted. Cardiovascular: Normal S1 and S2 without murmur  or rub. Extremities: No cyanosis. pulses are equal. Neurologic: Alert and oriented. No involuntary movements.  LABS: No results found for this or any previous visit (from the past 2160 hours).  Radiology: CT Chest Wo Contrast Result Date: 07/05/2024 EXAM: CT CHEST WITHOUT CONTRAST 07/05/2024 10:16:03 AM TECHNIQUE: CT of the chest was performed without the administration of intravenous contrast. Multiplanar reformatted images are provided for review. Automated exposure control, iterative reconstruction, and/or weight based adjustment of the mA/kV was utilized to reduce the radiation dose to as low as reasonably achievable. COMPARISON: Chest CTA 02/21/2023. 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 FINDINGS: MEDIASTINUM: Stable heart size. No pericardial effusion. Left atrial occluder device. Major airways remain patent. Chronic traction on the trachea and thoracic esophagus. Tortuous thoracic aorta with calcified atherosclerosis. Vascular patency not evaluated  in the absence of IV contrast. LYMPH NODES: No mediastinal, hilar or axillary lymphadenopathy. LUNGS AND PLEURA: Fairly advanced chronic right upper lobe lung disease with extensive bronchiectasis and architectural distortion stable from last year. Less pronounced bilateral middle lobe architectural distortion. Moderate chronic right costophrenic angle subpleural scarring and architectural distortion. Mild chronic lower lobe bronchiectasis, mild in comparison to the right upper lobe. Chronic left lower lobe lateral basal segment lung nodule on series 3 image 93 is stable since 2019 CTA and benign (no follow up imaging recommended). No pleural effusion or active lung inflammation identified. No pneumothorax. SOFT TISSUES/BONES: Chronic sternotomy. CABG. Osteopenia. Stable mild T7 superior endplate compression. UPPER ABDOMEN: Small to moderate chronic hiatal  hernia is stable. Otherwise negative visible noncontrast upper abdominal viscera. IMPRESSION: 1. Advanced chronic lung disease, especially right upper lobe bronchiectasis and architectural distortion. No superimposed acute or inflammatory process identified in the non-contrast chest. 2. Tortuous thoracic aorta with calcified atherosclerosis. Electronically signed by: Helayne Hurst MD 07/05/2024 10:39 AM EDT RP Workstation: [id number]    CT Chest Wo Contrast Result Date: 07/05/2024 EXAM: CT CHEST WITHOUT CONTRAST 07/05/2024 10:16:03 AM TECHNIQUE: CT of the chest was performed without the administration of intravenous contrast. Multiplanar reformatted images are provided for review. Automated exposure control, iterative reconstruction, and/or weight based adjustment of the mA/kV was utilized to reduce the radiation dose to as low as reasonably achievable. COMPARISON: Chest CTA 02/21/2023. 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 FINDINGS: MEDIASTINUM: Stable heart size. No pericardial effusion. Left atrial occluder device. Major airways remain patent. Chronic traction on the trachea and thoracic esophagus. Tortuous thoracic aorta with calcified atherosclerosis. Vascular patency not evaluated in the absence of IV contrast. LYMPH NODES: No mediastinal, hilar or axillary lymphadenopathy. LUNGS AND PLEURA: Fairly advanced chronic right upper lobe lung disease with extensive bronchiectasis and architectural distortion stable from last year. Less pronounced bilateral middle lobe architectural distortion. Moderate chronic right costophrenic angle subpleural scarring and architectural distortion. Mild chronic lower lobe bronchiectasis, mild in comparison to the right upper lobe. Chronic left lower lobe lateral basal segment lung nodule on series 3 image 93 is stable since 2019 CTA and benign (no follow up imaging recommended). No  pleural effusion or active lung inflammation identified. No pneumothorax. SOFT TISSUES/BONES: Chronic sternotomy. CABG. Osteopenia. Stable mild T7 superior endplate compression. UPPER ABDOMEN: Small to moderate chronic hiatal hernia is stable. Otherwise negative visible noncontrast upper abdominal viscera. IMPRESSION: 1. Advanced chronic lung disease, especially right upper lobe bronchiectasis and architectural distortion. No superimposed acute or inflammatory process identified in the non-contrast chest. 2. Tortuous thoracic aorta with calcified atherosclerosis. Electronically signed by: Helayne Hurst MD 07/05/2024 10:39 AM EDT RP Workstation: [id number]    CT Chest Wo Contrast Result Date: 07/05/2024 EXAM: CT CHEST WITHOUT CONTRAST 07/05/2024 10:16:03 AM TECHNIQUE: CT of the chest was performed without the administration of intravenous contrast. Multiplanar reformatted images are provided for review. Automated exposure control, iterative reconstruction, and/or weight based adjustment of the mA/kV was utilized to reduce the radiation dose to as low as reasonably achievable. COMPARISON: Chest CTA 02/21/2023. 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 FINDINGS: MEDIASTINUM: Stable heart size. No pericardial effusion. Left atrial occluder device. Major airways remain patent. Chronic traction on the trachea and thoracic esophagus. Tortuous thoracic aorta with calcified atherosclerosis. Vascular patency not evaluated in the absence of IV contrast.  LYMPH NODES: No mediastinal, hilar or axillary lymphadenopathy. LUNGS AND PLEURA: Fairly advanced chronic right upper lobe lung disease with extensive bronchiectasis and architectural distortion stable from last year. Less pronounced bilateral middle lobe architectural distortion. Moderate chronic right costophrenic angle subpleural scarring and architectural distortion. Mild chronic  lower lobe bronchiectasis, mild in comparison to the right upper lobe. Chronic left lower lobe lateral basal segment lung nodule on series 3 image 93 is stable since 2019 CTA and benign (no follow up imaging recommended). No pleural effusion or active lung inflammation identified. No pneumothorax. SOFT TISSUES/BONES: Chronic sternotomy. CABG. Osteopenia. Stable mild T7 superior endplate compression. UPPER ABDOMEN: Small to moderate chronic hiatal hernia is stable. Otherwise negative visible noncontrast upper abdominal viscera. IMPRESSION: 1. Advanced chronic lung disease, especially right upper lobe bronchiectasis and architectural distortion. No superimposed acute or inflammatory process identified in the non-contrast chest. 2. Tortuous thoracic aorta with calcified atherosclerosis. Electronically signed by: Helayne Hurst MD 07/05/2024 10:39 AM EDT RP Workstation: [id number]    Assessment and Plan: Patient Active Problem List   Diagnosis Date Noted   Esophageal dysphagia 07/31/2020   Paroxysmal atrial fibrillation (HCC) 07/19/2020   Hypokalemia 07/24/2019   Acute non-recurrent frontal sinusitis 12/10/2018   Leg swelling 12/10/2018   Recurrent sinus infections 04/22/2018   Vitamin B12 deficiency 03/18/2018   Fatigue 02/26/2018   Dizziness 02/26/2018   Cough 01/02/2018   Shortness of breath 01/02/2018   Acute recurrent pansinusitis 11/23/2017   Gastroesophageal reflux disease without esophagitis 11/23/2017   Allergic rhinitis 11/23/2017   Mild intermittent asthma with acute exacerbation 11/23/2017   CAD (coronary artery disease) 09/28/2017   Mixed hyperlipidemia 09/28/2017   Bilateral carotid artery stenosis 06/25/2015   Essential hypertension 06/17/2015   Peripheral vascular disease 05/27/2014    1. Obstructive chronic bronchitis without exacerbation (HCC) (Primary) He has MODERATE COPD but appears to be controlled better now with Albuterol . He did not tolerate breztri  so will DC this. He  may need to add dulera or symbicort in the future. Currently he is albuterol   2. Subacute cough Improved after stoppping breztri . Will hold off on adding anything new  3. SOB (shortness of breath) From COPD. He is better and states he is able to play clarinet   4. CAD follow with primary cardiologist  General Counseling: I have discussed the findings of the evaluation and examination with Mavis.  I have also discussed any further diagnostic evaluation thatmay be needed or ordered today. Cuinn verbalizes understanding of the findings of todays visit. We also reviewed his medications today and discussed drug interactions and side effects including but not limited excessive drowsiness and altered mental states. We also discussed that there is always a risk not just to him but also people around him. he has been encouraged to call the office with any questions or concerns that should arise related to todays visit.  No orders of the defined types were placed in this encounter.    Time spent: 96  I have personally obtained a history, examined the patient, evaluated laboratory and imaging results, formulated the assessment and plan and placed orders.    Elfreda DELENA Bathe, MD [facility]

## 2024-07-12 ENCOUNTER — Other Ambulatory Visit
Admission: RE | Admit: 2024-07-12 | Discharge: 2024-07-12 | Disposition: A | Discharge status: Home / Self Care | Source: Ambulatory Visit | Attending: Internal Medicine | Admitting: Internal Medicine

## 2024-07-12 DIAGNOSIS — R0789 Other chest pain: Secondary | ICD-10-CM | POA: Diagnosis not present

## 2024-07-12 DIAGNOSIS — J209 Acute bronchitis, unspecified: Secondary | ICD-10-CM | POA: Diagnosis not present

## 2024-07-12 DIAGNOSIS — R0602 Shortness of breath: Secondary | ICD-10-CM | POA: Diagnosis not present

## 2024-07-12 DIAGNOSIS — J019 Acute sinusitis, unspecified: Secondary | ICD-10-CM | POA: Diagnosis not present

## 2024-07-12 DIAGNOSIS — E782 Mixed hyperlipidemia: Secondary | ICD-10-CM | POA: Diagnosis not present

## 2024-07-12 DIAGNOSIS — B9689 Other specified bacterial agents as the cause of diseases classified elsewhere: Secondary | ICD-10-CM | POA: Diagnosis not present

## 2024-07-12 DIAGNOSIS — J4521 Mild intermittent asthma with (acute) exacerbation: Secondary | ICD-10-CM | POA: Diagnosis not present

## 2024-07-12 LAB — D-DIMER, QUANTITATIVE: D-Dimer, Quant: 0.4 ug{FEU}/mL (ref 0.00–0.50)

## 2024-07-12 LAB — TROPONIN I (HIGH SENSITIVITY): Troponin I (High Sensitivity): 9 ng/L (ref <18)

## 2024-07-22 ENCOUNTER — Other Ambulatory Visit: Payer: Self-pay

## 2024-07-22 ENCOUNTER — Encounter: Payer: Self-pay | Admitting: Internal Medicine

## 2024-07-22 ENCOUNTER — Observation Stay
Admission: AD | Admit: 2024-07-22 | Discharge: 2024-07-23 | Disposition: A | Discharge status: Home / Self Care | Source: Other Acute Inpatient Hospital | Attending: Student | Admitting: Student

## 2024-07-22 ENCOUNTER — Observation Stay

## 2024-07-22 ENCOUNTER — Telehealth: Payer: Self-pay | Admitting: Internal Medicine

## 2024-07-22 ENCOUNTER — Encounter (HOSPITAL_COMMUNITY): Payer: Self-pay

## 2024-07-22 ENCOUNTER — Ambulatory Visit (INDEPENDENT_AMBULATORY_CARE_PROVIDER_SITE_OTHER): Admitting: Internal Medicine

## 2024-07-22 VITALS — BP 121/67 | HR 86 | Temp 98.0°F | Resp 16 | Ht 68.0 in | Wt 187.0 lb

## 2024-07-22 DIAGNOSIS — E785 Hyperlipidemia, unspecified: Secondary | ICD-10-CM | POA: Diagnosis not present

## 2024-07-22 DIAGNOSIS — Z6828 Body mass index (BMI) 28.0-28.9, adult: Secondary | ICD-10-CM | POA: Diagnosis not present

## 2024-07-22 DIAGNOSIS — Z87891 Personal history of nicotine dependence: Secondary | ICD-10-CM | POA: Insufficient documentation

## 2024-07-22 DIAGNOSIS — J479 Bronchiectasis, uncomplicated: Secondary | ICD-10-CM | POA: Diagnosis not present

## 2024-07-22 DIAGNOSIS — I2583 Coronary atherosclerosis due to lipid rich plaque: Secondary | ICD-10-CM | POA: Diagnosis not present

## 2024-07-22 DIAGNOSIS — J4489 Other specified chronic obstructive pulmonary disease: Secondary | ICD-10-CM

## 2024-07-22 DIAGNOSIS — I480 Paroxysmal atrial fibrillation: Secondary | ICD-10-CM | POA: Diagnosis not present

## 2024-07-22 DIAGNOSIS — J45909 Unspecified asthma, uncomplicated: Secondary | ICD-10-CM | POA: Insufficient documentation

## 2024-07-22 DIAGNOSIS — I739 Peripheral vascular disease, unspecified: Secondary | ICD-10-CM | POA: Diagnosis not present

## 2024-07-22 DIAGNOSIS — R600 Localized edema: Secondary | ICD-10-CM | POA: Diagnosis not present

## 2024-07-22 DIAGNOSIS — E669 Obesity, unspecified: Secondary | ICD-10-CM | POA: Diagnosis present

## 2024-07-22 DIAGNOSIS — J441 Chronic obstructive pulmonary disease with (acute) exacerbation: Secondary | ICD-10-CM | POA: Diagnosis not present

## 2024-07-22 DIAGNOSIS — J4521 Mild intermittent asthma with (acute) exacerbation: Secondary | ICD-10-CM

## 2024-07-22 DIAGNOSIS — I2510 Atherosclerotic heart disease of native coronary artery without angina pectoris: Secondary | ICD-10-CM | POA: Diagnosis present

## 2024-07-22 DIAGNOSIS — I10 Essential (primary) hypertension: Secondary | ICD-10-CM | POA: Diagnosis not present

## 2024-07-22 DIAGNOSIS — E663 Overweight: Secondary | ICD-10-CM | POA: Diagnosis present

## 2024-07-22 DIAGNOSIS — J929 Pleural plaque without asbestos: Secondary | ICD-10-CM | POA: Diagnosis not present

## 2024-07-22 DIAGNOSIS — Z79899 Other long term (current) drug therapy: Secondary | ICD-10-CM | POA: Insufficient documentation

## 2024-07-22 DIAGNOSIS — R0602 Shortness of breath: Secondary | ICD-10-CM | POA: Diagnosis not present

## 2024-07-22 LAB — CBC
HCT: 41.3 % (ref 39.0–52.0)
Hemoglobin: 13.2 g/dL (ref 13.0–17.0)
MCH: 26.6 pg (ref 26.0–34.0)
MCHC: 32 g/dL (ref 30.0–36.0)
MCV: 83.3 fL (ref 80.0–100.0)
Platelets: 173 K/uL (ref 150–400)
RBC: 4.96 MIL/uL (ref 4.22–5.81)
RDW: 18.4 % — ABNORMAL HIGH (ref 11.5–15.5)
WBC: 12.6 K/uL — ABNORMAL HIGH (ref 4.0–10.5)
nRBC: 0 % (ref 0.0–0.2)

## 2024-07-22 LAB — BASIC METABOLIC PANEL WITH GFR
Anion gap: 10 (ref 5–15)
BUN: 31 mg/dL — ABNORMAL HIGH (ref 8–23)
CO2: 26 mmol/L (ref 22–32)
Calcium: 9.3 mg/dL (ref 8.9–10.3)
Chloride: 103 mmol/L (ref 98–111)
Creatinine, Ser: 1.19 mg/dL (ref 0.61–1.24)
GFR, Estimated: >60 mL/min (ref >60)
Glucose, Bld: 147 mg/dL — ABNORMAL HIGH (ref 70–99)
Potassium: 3.8 mmol/L (ref 3.5–5.1)
Sodium: 139 mmol/L (ref 135–145)

## 2024-07-22 LAB — RESP PANEL BY RT-PCR (RSV, FLU A&B, COVID)  RVPGX2
Influenza A by PCR: NEGATIVE
Influenza B by PCR: NEGATIVE
Resp Syncytial Virus by PCR: NEGATIVE
SARS Coronavirus 2 by RT PCR: NEGATIVE

## 2024-07-22 LAB — BRAIN NATRIURETIC PEPTIDE: B Natriuretic Peptide: 70.9 pg/mL (ref 0.0–100.0)

## 2024-07-22 MED ORDER — ALBUTEROL SULFATE HFA 108 (90 BASE) MCG/ACT IN AERS: 2.0000 | INHALATION_SPRAY | RESPIRATORY_TRACT | Status: DC | PRN

## 2024-07-22 MED ORDER — AZITHROMYCIN 250 MG PO TABS
500.0000 mg | ORAL_TABLET | Freq: Every day | ORAL | Status: AC
  Administered 2024-07-23: 500 mg via ORAL
  Filled 2024-07-22: qty 2

## 2024-07-22 MED ORDER — PREDNISONE 10 MG (21) PO TBPK: ORAL_TABLET | ORAL | 0 refills | Status: DC

## 2024-07-22 MED ORDER — PANTOPRAZOLE SODIUM 40 MG PO TBEC
40.0000 mg | DELAYED_RELEASE_TABLET | Freq: Every day | ORAL | Status: DC
  Administered 2024-07-23: 40 mg via ORAL
  Filled 2024-07-22: qty 1

## 2024-07-22 MED ORDER — ASPIRIN 81 MG PO CHEW
81.0000 mg | CHEWABLE_TABLET | Freq: Every day | ORAL | Status: DC
  Administered 2024-07-23: 81 mg via ORAL
  Filled 2024-07-22: qty 1

## 2024-07-22 MED ORDER — ACETAMINOPHEN 325 MG PO TABS: 650.0000 mg | ORAL_TABLET | Freq: Four times a day (QID) | ORAL | Status: DC | PRN

## 2024-07-22 MED ORDER — METHYLPREDNISOLONE SODIUM SUCC 125 MG IJ SOLR
80.0000 mg | Freq: Every day | INTRAMUSCULAR | Status: DC
  Administered 2024-07-23: 80 mg via INTRAVENOUS
  Filled 2024-07-22: qty 2

## 2024-07-22 MED ORDER — LORATADINE 10 MG PO TABS
10.0000 mg | ORAL_TABLET | Freq: Every day | ORAL | Status: DC
  Administered 2024-07-23: 10 mg via ORAL
  Filled 2024-07-22: qty 1

## 2024-07-22 MED ORDER — ALBUTEROL SULFATE (2.5 MG/3ML) 0.083% IN NEBU
2.5000 mg | INHALATION_SOLUTION | RESPIRATORY_TRACT | Status: DC | PRN
Start: 2024-07-22 — End: 2024-07-23

## 2024-07-22 MED ORDER — ONDANSETRON HCL 4 MG/2ML IJ SOLN: 4.0000 mg | Freq: Three times a day (TID) | INTRAMUSCULAR | Status: DC | PRN

## 2024-07-22 MED ORDER — ENOXAPARIN SODIUM 40 MG/0.4ML IJ SOSY
40.0000 mg | PREFILLED_SYRINGE | INTRAMUSCULAR | Status: DC
  Administered 2024-07-22: 40 mg via SUBCUTANEOUS
  Filled 2024-07-22: qty 0.4

## 2024-07-22 MED ORDER — METHYLPREDNISOLONE SODIUM SUCC 125 MG IJ SOLR
125.0000 mg | Freq: Once | INTRAMUSCULAR | Status: AC
  Administered 2024-07-22: 125 mg via INTRAVENOUS
  Filled 2024-07-22: qty 2

## 2024-07-22 MED ORDER — ATORVASTATIN CALCIUM 20 MG PO TABS
20.0000 mg | ORAL_TABLET | Freq: Every day | ORAL | Status: DC
  Administered 2024-07-23: 20 mg via ORAL
  Filled 2024-07-22: qty 1

## 2024-07-22 MED ORDER — DM-GUAIFENESIN ER 30-600 MG PO TB12: 1.0000 | ORAL_TABLET | Freq: Two times a day (BID) | ORAL | Status: DC | PRN

## 2024-07-22 MED ORDER — HYDRALAZINE HCL 20 MG/ML IJ SOLN
5.0000 mg | INTRAMUSCULAR | Status: DC | PRN
Start: 2024-07-22 — End: 2024-07-23

## 2024-07-22 MED ORDER — MONTELUKAST SODIUM 10 MG PO TABS
10.0000 mg | ORAL_TABLET | Freq: Every day | ORAL | Status: DC
  Administered 2024-07-22: 10 mg via ORAL
  Filled 2024-07-22: qty 1

## 2024-07-22 MED ORDER — AMLODIPINE BESYLATE 10 MG PO TABS
10.0000 mg | ORAL_TABLET | Freq: Every day | ORAL | Status: DC
  Administered 2024-07-23: 10 mg via ORAL
  Filled 2024-07-22: qty 1

## 2024-07-22 MED ORDER — IPRATROPIUM-ALBUTEROL 0.5-2.5 (3) MG/3ML IN SOLN
3.0000 mL | Freq: Two times a day (BID) | RESPIRATORY_TRACT | Status: DC
  Administered 2024-07-23: 3 mL via RESPIRATORY_TRACT

## 2024-07-22 MED ORDER — AZITHROMYCIN 250 MG PO TABS: 250.0000 mg | ORAL_TABLET | Freq: Every day | ORAL | Status: DC

## 2024-07-22 MED ORDER — IPRATROPIUM-ALBUTEROL 0.5-2.5 (3) MG/3ML IN SOLN
3.0000 mL | RESPIRATORY_TRACT | Status: DC
  Administered 2024-07-22: 3 mL via RESPIRATORY_TRACT
  Filled 2024-07-22: qty 3

## 2024-07-22 MED ORDER — AZITHROMYCIN 250 MG PO TABS: ORAL_TABLET | ORAL | 0 refills | Status: DC

## 2024-07-22 NOTE — Telephone Encounter (Signed)
 Called this morning with a direct admit per Dr. Elfreda Bathe... currently waiting on a room #  to be assigned to the patient. Will follow up with bed placement @ [phone].

## 2024-07-22 NOTE — Patient Instructions (Signed)

## 2024-07-22 NOTE — Progress Notes (Signed)
 [facility] [address] [place], [place] [address]  Pulmonary Sleep Medicine   Office Visit Note  Patient Name: James Mathews DOB: [birth year] MRN [id number]  Date of Service: 07/22/2024  Complaints/HPI: He has been noting difficulty with his breathing. He states he went to an outpatient clinic and was given prednisone  levaquin  and cough medicine. Patient has finished the meds. He feels like he is not back to baseline. Last FEV1 was actually in the mild category. He takes advair. He feels as though it is not making a difference.  He does also have a history of coronary artery disease and is status post CABG in the past.  Patient was visibly having shortness of breath.  Office Spirometry Results:     ROS  General: (-) fever, (-) chills, (-) night sweats, (-) weakness Skin: (-) rashes, (-) itching,. Eyes: (-) visual changes, (-) redness, (-) itching. Nose and Sinuses: (-) nasal stuffiness or itchiness, (-) postnasal drip, (-) nosebleeds, (-) sinus trouble. Mouth and Throat: (-) sore throat, (-) hoarseness. Neck: (-) swollen glands, (-) enlarged thyroid , (-) neck pain. Respiratory: + cough, (-) bloody sputum, + shortness of breath, - wheezing. Cardiovascular: - ankle swelling, (-) chest pain. Lymphatic: (-) lymph node enlargement. Neurologic: (-) numbness, (-) tingling. Psychiatric: (-) anxiety, (-) depression   Current Medication: Outpatient Encounter Medications as of 07/22/2024  Medication Sig   albuterol  (VENTOLIN  HFA) 108 (90 Base) MCG/ACT inhaler INHALE 2 PUFFS INTO THE LUNGS EVERY 6 HOURS AS NEEDED FOR WHEEZING OR SHORTNESS OF BREATH   amLODipine  (NORVASC ) 10 MG tablet Take 10 mg by mouth daily.   ASPIRIN  81 PO Take 81 mg by mouth. 1 daily   atorvastatin (LIPITOR) 20 MG tablet Take 20 mg by mouth daily.   benzonatate  (TESSALON ) 200 MG capsule Take 1 capsule (200 mg total) by mouth 2 (two) times daily as needed.   Calcium-Magnesium-Vitamin D  400-166.7-133.3 MG-MG-UNIT TABS Take by mouth.   fluticasone  (FLONASE ) 50 MCG/ACT nasal spray SHAKE LIQUID AND USE 2 SPRAYS IN EACH NOSTRIL DAILY   ipratropium-albuterol  (DUONEB) 0.5-2.5 (3) MG/3ML SOLN Take 3 mLs by nebulization every 4 (four) hours as needed.   levofloxacin  (LEVAQUIN ) 750 MG tablet Take 1 tablet (750 mg total) by mouth daily. Take with food   loratadine  (CLARITIN ) 10 MG tablet Take 1 tablet (10 mg total) by mouth daily.   lubiprostone  (AMITIZA ) 8 MCG capsule Take 1 capsule (8 mcg total) by mouth 2 (two) times daily with a meal.   montelukast  (SINGULAIR ) 10 MG tablet TAKE 1 TABLET BY MOUTH DAILY FOR CHRONIC ALLERGIC RHINITIS/ASTHMA   pantoprazole  (PROTONIX ) 40 MG tablet Take 1 tablet (40 mg total) by mouth daily.   potassium chloride  (KLOR-CON ) 10 MEQ tablet TAKE 1 TABLET BY MOUTH DAILY   predniSONE  (STERAPRED UNI-PAK 21 TAB) 10 MG (21) TBPK tablet Use as directed for 6 days   promethazine -codeine  (PHENERGAN  WITH CODEINE ) 6.25-10 MG/5ML syrup Take 5 mLs by mouth every 6 (six) hours as needed for cough.   triamcinolone  cream (KENALOG ) 0.1 % Apply 1 Application topically 2 (two) times daily. To affected area until resolved.   vitamin C  (ASCORBIC ACID) 500 MG tablet Take 500 mg by mouth daily.   vitamin E 1000 UNIT capsule Take 1,000 Units by mouth daily.   No facility-administered encounter medications on file as of 07/22/2024.    Surgical History: Past Surgical History:  Procedure Laterality Date   cartaract surgery both eye Bilateral    CORONARY ARTERY BYPASS GRAFT Bilateral  CYSTOSCOPY     HERNIA REPAIR     knee replacment Bilateral    LEFT ATRIAL APPENDAGE OCCLUSION  11/10/2021   retina surgery on right eye  Right 05/16/2017   TONSILLECTOMY Bilateral     Medical History: Past Medical History:  Diagnosis Date   Asthma    Atrial fibrillation (HCC)    Coronary artery disease    Hyperlipidemia    Hypertension     Family History: Family History  Problem  Relation Age of Onset   Diabetes Mother    Bowel Disease Father    Alcohol abuse Father     Social History: Social History   Socioeconomic History   Marital status: Married    Spouse name: Not on file   Number of children: Not on file   Years of education: Not on file   Highest education level: Not on file  Occupational History   Not on file  Tobacco Use   Smoking status: Former    Passive exposure: Past   Smokeless tobacco: Never  Substance and Sexual Activity   Alcohol use: No   Drug use: No   Sexual activity: Not on file  Other Topics Concern   Not on file  Social History Narrative   Not on file   Social Drivers of Health   Financial Resource Strain: Low Risk  (03/23/2021)   Overall Financial Resource Strain (CARDIA)    Difficulty of Paying Living Expenses: Not very hard  Food Insecurity: Not on file  Transportation Needs: Not on file  Physical Activity: Not on file  Stress: Not on file  Social Connections: Not on file  Intimate Partner Violence: Not on file    Vital Signs: Blood pressure 121/67, pulse 86, temperature 98 F (36.7 C), resp. rate 16, height 5' 8 (1.727 m), weight 187 lb (84.8 kg), SpO2 97%.  Examination: General Appearance: The patient is well-developed, well-nourished, and in no distress. Skin: Gross inspection of skin unremarkable. Head: normocephalic, no gross deformities. Eyes: no gross deformities noted. ENT: ears appear grossly normal no exudates. Neck: Supple. No thyromegaly. No LAD. Respiratory: few basil crackles. Cardiovascular: Normal S1 and S2 without murmur or rub. Extremities: No cyanosis. pulses are equal. Neurologic: Alert and oriented. No involuntary movements.  LABS: Recent Results (from the past 2160 hours)  D-dimer, quantitative     Status: None   Collection Time: 07/12/24  8:58 AM  Result Value Ref Range   D-Dimer, Quant 0.40 0.00 - 0.50 ug/mL-FEU    Comment: (NOTE) At the manufacturer cut-off value of 0.5 g/mL  FEU, this assay has a negative predictive value of 95-100%.This assay is intended for use in conjunction with a clinical pretest probability (PTP) assessment model to exclude pulmonary embolism (PE) and deep venous thrombosis (DVT) in outpatients suspected of PE or DVT. Results should be correlated with clinical presentation. Performed at [facility], [address] [place], [place] [address]   Troponin I (High Sensitivity)     Status: None   Collection Time: 07/12/24  8:58 AM  Result Value Ref Range   Troponin I (High Sensitivity) 9 <18 ng/L    Comment: (NOTE) Elevated high sensitivity troponin I (hsTnI) values and significant  changes across serial measurements may suggest ACS but many other  chronic and acute conditions are known to elevate hsTnI results.  Refer to the Links section for chest pain algorithms and additional  guidance. Performed at [facility], [address], [place], [place] [address]  Radiology: No results found.  No results found.  CT Chest Wo Contrast Result Date: 07/05/2024 EXAM: CT CHEST WITHOUT CONTRAST 07/05/2024 10:16:03 AM TECHNIQUE: CT of the chest was performed without the administration of intravenous contrast. Multiplanar reformatted images are provided for review. Automated exposure control, iterative reconstruction, and/or weight based adjustment of the mA/kV was utilized to reduce the radiation dose to as low as reasonably achievable. COMPARISON: Chest CTA 02/21/2023. 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 FINDINGS: MEDIASTINUM: Stable heart size. No pericardial effusion. Left atrial occluder device. Major airways remain patent. Chronic traction on the trachea and thoracic esophagus. Tortuous thoracic aorta with calcified atherosclerosis. Vascular patency not evaluated in the absence of IV contrast. LYMPH NODES: No mediastinal,  hilar or axillary lymphadenopathy. LUNGS AND PLEURA: Fairly advanced chronic right upper lobe lung disease with extensive bronchiectasis and architectural distortion stable from last year. Less pronounced bilateral middle lobe architectural distortion. Moderate chronic right costophrenic angle subpleural scarring and architectural distortion. Mild chronic lower lobe bronchiectasis, mild in comparison to the right upper lobe. Chronic left lower lobe lateral basal segment lung nodule on series 3 image 93 is stable since 2019 CTA and benign (no follow up imaging recommended). No pleural effusion or active lung inflammation identified. No pneumothorax. SOFT TISSUES/BONES: Chronic sternotomy. CABG. Osteopenia. Stable mild T7 superior endplate compression. UPPER ABDOMEN: Small to moderate chronic hiatal hernia is stable. Otherwise negative visible noncontrast upper abdominal viscera. IMPRESSION: 1. Advanced chronic lung disease, especially right upper lobe bronchiectasis and architectural distortion. No superimposed acute or inflammatory process identified in the non-contrast chest. 2. Tortuous thoracic aorta with calcified atherosclerosis. Electronically signed by: Helayne Hurst MD 07/05/2024 10:39 AM EDT RP Workstation: [id number]    Assessment and Plan: Patient Active Problem List   Diagnosis Date Noted   Esophageal dysphagia 07/31/2020   Paroxysmal atrial fibrillation (HCC) 07/19/2020   Hypokalemia 07/24/2019   Acute non-recurrent frontal sinusitis 12/10/2018   Leg swelling 12/10/2018   Recurrent sinus infections 04/22/2018   Vitamin B12 deficiency 03/18/2018   Fatigue 02/26/2018   Dizziness 02/26/2018   Cough 01/02/2018   Shortness of breath 01/02/2018   Acute recurrent pansinusitis 11/23/2017   Gastroesophageal reflux disease without esophagitis 11/23/2017   Allergic rhinitis 11/23/2017   Mild intermittent asthma with acute exacerbation 11/23/2017   CAD (coronary artery disease) 09/28/2017    Mixed hyperlipidemia 09/28/2017   Bilateral carotid artery stenosis 06/25/2015   Essential hypertension 06/17/2015   Peripheral vascular disease 05/27/2014    1. Obstructive chronic bronchitis without exacerbation (HCC) (Primary) Appears that he had an exacerbation was treated with antibiotics and steroids but he is not quite gotten back to baseline.  I walked him in the hallway and he be came quite short of breath with minimal exertion.  His oxygen saturation did drop by 5% I spoke with the hospitalist service and recommended that he be admitted for at least 24 hours of observation and to receive IV steroids and antibiotics to see if this will help him. - azithromycin  (ZITHROMAX ) 250 MG tablet; Take 2 tablets on day 1, then 1 tablet daily on days 2 through 5  Dispense: 6 tablet; Refill: 0 - predniSONE  (STERAPRED UNI-PAK 21 TAB) 10 MG (21) TBPK tablet; Take dose pack as directed  Dispense: 21 each; Refill: 0  2. Coronary artery disease due to lipid rich plaque Another factor is history of coronary artery disease in the past with a history of  CABG.  Perhaps he might need to have a further evaluation by cardiology.  3. SOB (shortness of breath) - DG Chest 2 View; Future   General Counseling: I have discussed the findings of the evaluation and examination with Mavis.  I have also discussed any further diagnostic evaluation thatmay be needed or ordered today. Nathanyal verbalizes understanding of the findings of todays visit. We also reviewed his medications today and discussed drug interactions and side effects including but not limited excessive drowsiness and altered mental states. We also discussed that there is always a risk not just to him but also people around him. he has been encouraged to call the office with any questions or concerns that should arise related to todays visit.  No orders of the defined types were placed in this encounter.    Time spent: 58  I have personally obtained  a history, examined the patient, evaluated laboratory and imaging results, formulated the assessment and plan and placed orders.    Elfreda DELENA Bathe, MD [facility] Pulmonary and Critical Care Sleep medicine

## 2024-07-22 NOTE — H&P (Signed)
 History and Physical    James Mathews FMW:[id number] DOB: November 09, 1940 DOA: 07/22/2024  Referring MD/NP/PA:   PCP: Liana Fish, NP   Patient coming from:  The patient is coming from home.     Chief Complaint: SOB  HPI: James Mathews is a 83 y.o. male with medical history significant of COPD/asthma, HTN, HLD, CAD, s/p of CABG PAF not on anticoagulants, who presents with SOB.  Patient states that he has SOB intermittently for more than 2 months which has progressively worsening recently.  Patient has been following up with Dr. Fernand of pulmonology, and was treated with course of Levaquin  and prednisone .  He had some improvement initially, but then started SOB again which has been persistent.  He has cough with little white mucus production.  No chest pain, fever or chills.  He has bilateral lower leg edema, denies weight gain recently.  No recent long distance traveling.  No tenderness in the calf area.  No nausea vomiting, diarrhea or abdominal pain.  No symptoms of UTI. Pt has CT chest without contrast recently which showed advanced chronic lung disease in the right upper lobe bronchiectasis. Pt was seen by Dr. Fernand again today, who requested direct admission for further evaluation and treatment.  CT of the chest without contrast 07/05/2024 1. Advanced chronic lung disease, especially right upper lobe bronchiectasis and architectural distortion. No superimposed acute or inflammatory process identified in the non-contrast chest. 2. Tortuous thoracic aorta with calcified atherosclerosis.    Data reviewed independently and ED Course: pt was found to have WBC 12.6, GFR> 60, temperature normal, blood pressure 127/69, heart rate 58-86, RR 18, oxygen sat 96% on room air.  Patient is placed in telemetry bed for observation.   EKG: I have personally reviewed.  Not done in ED, will get one.   ***   Review of Systems:   General: no fevers, chills, no body weight gain, has  fatigue HEENT: no blurry vision, hearing changes or sore throat Respiratory: has dyspnea, coughing, wheezing CV: no chest pain, no palpitations GI: no nausea, vomiting, abdominal pain, diarrhea, constipation GU: no dysuria, burning on urination, increased urinary frequency, hematuria  Ext: has leg edema Neuro: no unilateral weakness, numbness, or tingling, no vision change or hearing loss Skin: no rash, no skin tear. MSK: No muscle spasm, no deformity, no limitation of range of movement in spin Heme: No easy bruising.  Travel history: No recent long distant travel.   Allergy: No Known Allergies  Past Medical History:  Diagnosis Date   Asthma    Atrial fibrillation (HCC)    Coronary artery disease    Hyperlipidemia    Hypertension     Past Surgical History:  Procedure Laterality Date   cartaract surgery both eye Bilateral    CORONARY ARTERY BYPASS GRAFT Bilateral    CYSTOSCOPY     HERNIA REPAIR     knee replacment Bilateral    LEFT ATRIAL APPENDAGE OCCLUSION  11/10/2021   retina surgery on right eye  Right 05/16/2017   TONSILLECTOMY Bilateral     Social History:  reports that he has quit smoking. He has been exposed to tobacco smoke. He has never used smokeless tobacco. He reports that he does not drink alcohol and does not use drugs.  Family History:  Family History  Problem Relation Age of Onset   Diabetes Mother    Bowel Disease Father    Alcohol abuse Father      Prior to Admission medications  Medication Sig Start Date End Date Taking? Authorizing Provider  albuterol  (VENTOLIN  HFA) 108 (90 Base) MCG/ACT inhaler INHALE 2 PUFFS INTO THE LUNGS EVERY 6 HOURS AS NEEDED FOR WHEEZING OR SHORTNESS OF BREATH 11/06/20   Khan, Fozia M, MD  amLODipine  (NORVASC ) 10 MG tablet Take 10 mg by mouth daily.    [provider]  ASPIRIN  81 PO Take 81 mg by mouth. 1 daily    [provider]  atorvastatin (LIPITOR) 20 MG tablet Take 20 mg by mouth daily.     [provider]  azithromycin  (ZITHROMAX ) 250 MG tablet Take 2 tablets on day 1, then 1 tablet daily on days 2 through 5 07/22/24 07/27/24  Fernand Elfreda LABOR, MD  benzonatate  (TESSALON ) 200 MG capsule Take 1 capsule (200 mg total) by mouth 2 (two) times daily as needed. 07/05/24   Liana Fish, NP  Calcium-Magnesium-Vitamin D 400-166.7-133.3 MG-MG-UNIT TABS Take by mouth.    [provider]  fluticasone  (FLONASE ) 50 MCG/ACT nasal spray SHAKE LIQUID AND USE 2 SPRAYS IN EACH NOSTRIL DAILY 03/03/22   Liana Fish, NP  ipratropium-albuterol  (DUONEB) 0.5-2.5 (3) MG/3ML SOLN Take 3 mLs by nebulization every 4 (four) hours as needed. 07/05/24   Liana Fish, NP  levofloxacin  (LEVAQUIN ) 750 MG tablet Take 1 tablet (750 mg total) by mouth daily. Take with food 06/06/24   Liana Fish, NP  loratadine  (CLARITIN ) 10 MG tablet Take 1 tablet (10 mg total) by mouth daily. 03/14/24   Abernathy, Alyssa, NP  lubiprostone  (AMITIZA ) 8 MCG capsule Take 1 capsule (8 mcg total) by mouth 2 (two) times daily with a meal. 03/14/24   Abernathy, Alyssa, NP  montelukast  (SINGULAIR ) 10 MG tablet TAKE 1 TABLET BY MOUTH DAILY FOR CHRONIC ALLERGIC RHINITIS/ASTHMA 07/19/23   Liana Fish, NP  pantoprazole  (PROTONIX ) 40 MG tablet Take 1 tablet (40 mg total) by mouth daily. 04/09/24   Liana Fish, NP  potassium chloride  (KLOR-CON ) 10 MEQ tablet TAKE 1 TABLET BY MOUTH DAILY 02/21/24   Liana Fish, NP  predniSONE  (STERAPRED UNI-PAK 21 TAB) 10 MG (21) TBPK tablet Take dose pack as directed 07/22/24   Fernand Elfreda LABOR, MD  promethazine -codeine  (PHENERGAN  WITH CODEINE ) 6.25-10 MG/5ML syrup Take 5 mLs by mouth every 6 (six) hours as needed for cough. 06/06/24   Liana Fish, NP  triamcinolone  cream (KENALOG ) 0.1 % Apply 1 Application topically 2 (two) times daily. To affected area until resolved. 03/14/24   Liana Fish, NP  vitamin C  (ASCORBIC ACID) 500 MG tablet Take 500 mg by mouth daily.     [provider]  vitamin E 1000 UNIT capsule Take 1,000 Units by mouth daily.    [provider]    Physical Exam: Vitals:   07/22/24 1920 07/22/24 2033 07/22/24 2053  BP: 127/69  130/67  Pulse: (!) 58  73  Resp: 18  18  Temp: 97.6 F (36.4 C)  (!) 97.5 F (36.4 C)  TempSrc: Oral  Oral  SpO2: 96% 95% 95%   General: Not in acute distress HEENT:       Eyes: PERRL, EOMI, no jaundice       ENT: No discharge from the ears and nose, no pharynx injection, no tonsillar enlargement.        Neck: No JVD, no bruit, no mass felt. Heme: No neck lymph node enlargement. Cardiac: S1/S2, RRR, No murmurs, No gallops or rubs. Respiratory: has decreased air movement bilaterally, with mild wheezing bilaterally GI: Soft, nondistended, nontender, no rebound pain, no organomegaly,  BS present. GU: No hematuria Ext: has 1+ pitting leg edema bilaterally. 1+DP/PT pulse bilaterally. Musculoskeletal: No joint deformities, No joint redness or warmth, no limitation of ROM in spin. Skin: No rashes.  Neuro: Alert, oriented X3, cranial nerves II-XII grossly intact, moves all extremities normally. Psych: Patient is not psychotic, no suicidal or hemocidal ideation.  Labs on Admission: I have personally reviewed following labs and imaging studies  CBC: Recent Labs  Lab 07/22/24 2004  WBC 12.6*  HGB 13.2  HCT 41.3  MCV 83.3  PLT 173   Basic Metabolic Panel: Recent Labs  Lab 07/22/24 2004  NA 139  K 3.8  CL 103  CO2 26  GLUCOSE 147*  BUN 31*  CREATININE 1.19  CALCIUM 9.3   GFR: Estimated Creatinine Clearance: 49 mL/min (by C-G formula based on SCr of 1.19 mg/dL). Liver Function Tests: No results for input(s): AST, ALT, ALKPHOS, BILITOT, PROT, ALBUMIN in the last 168 hours. No results for input(s): LIPASE, AMYLASE in the last 168 hours. No results for input(s): AMMONIA in the last 168 hours. Coagulation Profile: No results for input(s): INR, PROTIME in  the last 168 hours. Cardiac Enzymes: No results for input(s): CKTOTAL, CKMB, CKMBINDEX, TROPONINI in the last 168 hours. BNP (last 3 results) No results for input(s): PROBNP in the last 8760 hours. HbA1C: No results for input(s): HGBA1C in the last 72 hours. CBG: No results for input(s): GLUCAP in the last 168 hours. Lipid Profile: No results for input(s): CHOL, HDL, LDLCALC, TRIG, CHOLHDL, LDLDIRECT in the last 72 hours. Thyroid  Function Tests: No results for input(s): TSH, T4TOTAL, FREET4, T3FREE, THYROIDAB in the last 72 hours. Anemia Panel: No results for input(s): VITAMINB12, FOLATE, FERRITIN, TIBC, IRON, RETICCTPCT in the last 72 hours. Urine analysis:    Component Value Date/Time   APPEARANCEUR Clear 03/03/2022 1615   GLUCOSEU Negative 03/03/2022 1615   BILIRUBINUR Negative 03/03/2022 1615   PROTEINUR Negative 03/03/2022 1615   NITRITE Negative 03/03/2022 1615   LEUKOCYTESUR Negative 03/03/2022 1615   Sepsis Labs: @LABRCNTIP (procalcitonin:4,lacticidven:4) )No results found for this or any previous visit (from the past 240 hours).   Radiological Exams on Admission:   Assessment/Plan Principal Problem:   Acute exacerbation of COPD with asthma (HCC) Active Problems:   Bronchiectasis (HCC)   Essential hypertension   CAD (coronary artery disease)   Paroxysmal atrial fibrillation (HCC)   Peripheral vascular disease   HLD (hyperlipidemia)   Bilateral leg edema   Obesity (BMI 30-39.9)   Assessment and Plan:  Principal Problem:   Acute exacerbation of COPD with asthma (HCC) Active Problems:   Bronchiectasis (HCC)   Essential hypertension   CAD (coronary artery disease)   Paroxysmal atrial fibrillation (HCC)   Peripheral vascular disease   HLD (hyperlipidemia)   Bilateral leg edema   Obesity (BMI 30-39.9)      DVT ppx: SQ Lovenox  Code Status: Full code  Family Communication:   Yes, patient's son  at bed  side.    Disposition Plan:  Anticipate discharge back to previous environment  Consults called:  none  Admission status and Level of care: Telemetry:    for obs     Dispo: The patient is from: Home              Anticipated d/c is to: Home              Anticipated d/c date is: 1 day              Patient  currently is not medically stable to d/c.    Severity of Illness:  The appropriate patient status for this patient is OBSERVATION. Observation status is judged to be reasonable and necessary in order to provide the required intensity of service to ensure the patient's safety. The patient's presenting symptoms, physical exam findings, and initial radiographic and laboratory data in the context of their medical condition is felt to place them at decreased risk for further clinical deterioration. Furthermore, it is anticipated that the patient will be medically stable for discharge from the hospital within 2 midnights of admission.        Date of Service 07/22/2024    Caleb Exon [facility]   If 7PM-7AM, please contact night-coverage [email or web address] 07/22/2024, 9:53 PM

## 2024-07-23 ENCOUNTER — Other Ambulatory Visit: Payer: Self-pay

## 2024-07-23 ENCOUNTER — Observation Stay

## 2024-07-23 DIAGNOSIS — I480 Paroxysmal atrial fibrillation: Secondary | ICD-10-CM | POA: Diagnosis not present

## 2024-07-23 DIAGNOSIS — M79605 Pain in left leg: Secondary | ICD-10-CM | POA: Diagnosis not present

## 2024-07-23 DIAGNOSIS — J441 Chronic obstructive pulmonary disease with (acute) exacerbation: Secondary | ICD-10-CM | POA: Diagnosis not present

## 2024-07-23 DIAGNOSIS — E663 Overweight: Secondary | ICD-10-CM | POA: Diagnosis present

## 2024-07-23 DIAGNOSIS — J479 Bronchiectasis, uncomplicated: Secondary | ICD-10-CM | POA: Diagnosis not present

## 2024-07-23 DIAGNOSIS — I10 Essential (primary) hypertension: Secondary | ICD-10-CM | POA: Diagnosis not present

## 2024-07-23 DIAGNOSIS — M79604 Pain in right leg: Secondary | ICD-10-CM | POA: Diagnosis not present

## 2024-07-23 DIAGNOSIS — J45909 Unspecified asthma, uncomplicated: Secondary | ICD-10-CM | POA: Diagnosis not present

## 2024-07-23 DIAGNOSIS — I2510 Atherosclerotic heart disease of native coronary artery without angina pectoris: Secondary | ICD-10-CM | POA: Diagnosis not present

## 2024-07-23 LAB — CBC
HCT: 38.6 % — ABNORMAL LOW (ref 39.0–52.0)
Hemoglobin: 12.3 g/dL — ABNORMAL LOW (ref 13.0–17.0)
MCH: 26.5 pg (ref 26.0–34.0)
MCHC: 31.9 g/dL (ref 30.0–36.0)
MCV: 83.2 fL (ref 80.0–100.0)
Platelets: 167 K/uL (ref 150–400)
RBC: 4.64 MIL/uL (ref 4.22–5.81)
RDW: 18.2 % — ABNORMAL HIGH (ref 11.5–15.5)
WBC: 9.5 K/uL (ref 4.0–10.5)
nRBC: 0 % (ref 0.0–0.2)

## 2024-07-23 LAB — PHOSPHORUS: Phosphorus: 3.5 mg/dL (ref 2.5–4.6)

## 2024-07-23 LAB — BASIC METABOLIC PANEL WITH GFR
Anion gap: 11 (ref 5–15)
BUN: 28 mg/dL — ABNORMAL HIGH (ref 8–23)
CO2: 23 mmol/L (ref 22–32)
Calcium: 8.7 mg/dL — ABNORMAL LOW (ref 8.9–10.3)
Chloride: 103 mmol/L (ref 98–111)
Creatinine, Ser: 0.98 mg/dL (ref 0.61–1.24)
GFR, Estimated: >60 mL/min (ref >60)
Glucose, Bld: 209 mg/dL — ABNORMAL HIGH (ref 70–99)
Potassium: 4.7 mmol/L (ref 3.5–5.1)
Sodium: 137 mmol/L (ref 135–145)

## 2024-07-23 LAB — HEPATIC FUNCTION PANEL
ALT: 18 U/L (ref 0–44)
AST: 20 U/L (ref 15–41)
Albumin: 3.3 g/dL — ABNORMAL LOW (ref 3.5–5.0)
Alkaline Phosphatase: 43 U/L (ref 38–126)
Bilirubin, Direct: 0.2 mg/dL (ref 0.0–0.2)
Indirect Bilirubin: 0.7 mg/dL (ref 0.3–0.9)
Total Bilirubin: 0.9 mg/dL (ref 0.0–1.2)
Total Protein: 5.8 g/dL — ABNORMAL LOW (ref 6.5–8.1)

## 2024-07-23 LAB — MAGNESIUM: Magnesium: 2.4 mg/dL (ref 1.7–2.4)

## 2024-07-23 MED ORDER — AZITHROMYCIN 250 MG PO TABS
250.0000 mg | ORAL_TABLET | Freq: Every day | ORAL | 0 refills | Status: AC
  Filled 2024-07-23: qty 4, 4d supply, fill #0

## 2024-07-23 MED ORDER — PREDNISONE 20 MG PO TABS
ORAL_TABLET | ORAL | 0 refills | Status: AC
  Filled 2024-07-23: qty 5, 4d supply, fill #0

## 2024-07-23 MED ORDER — ALBUTEROL SULFATE HFA 108 (90 BASE) MCG/ACT IN AERS
2.0000 | INHALATION_SPRAY | Freq: Four times a day (QID) | RESPIRATORY_TRACT | 3 refills | Status: AC | PRN
  Filled 2024-07-23: qty 18, 25d supply, fill #0

## 2024-07-23 NOTE — Discharge Summary (Signed)
 [facility] Discharge Summary   Patient: James Mathews FMW:[id number]  PCP: Liana Fish, NP  Date of admission: 07/22/2024   Date of discharge: 07/23/2024 07/23/2024     Discharge Diagnoses:  Principal Problem:   Acute exacerbation of COPD with asthma (HCC) Active Problems:   Bronchiectasis (HCC)   Essential hypertension   CAD (coronary artery disease)   Paroxysmal atrial fibrillation (HCC)   Peripheral vascular disease   HLD (hyperlipidemia)   Bilateral leg edema   Overweight (BMI 25.0-29.9)   Admitted From: Home Disposition:  Home   Recommendations for Outpatient Follow-up:  Follow-up with PCP in 1 week Follow-up with pulmonary in 1 week Follow up LABS/TEST:     Diet recommendation: Heart healthy diet  Activity: The patient is advised to gradually reintroduce usual activities, as tolerated  Discharge Condition: stable  Code Status: Full code   History of present illness: As per the H and P dictated on admission.   Hospital Course:   Hansen E Miao is a 83 y.o. male with medical history significant of COPD/asthma, HTN, HLD, CAD, s/p of CABG PAF not on anticoagulants, who presents with SOB.   Patient states that he has SOB intermittently for more than 2 months which has progressively worsening recently.  Patient has been following up with Dr. Fernand of pulmonology, and was treated with course of Levaquin  and prednisone .  He had some improvement initially, but then started SOB again which has been persistent.  He has cough with little white mucus production.  No chest pain, fever or chills.  He has bilateral lower leg edema, denies weight gain recently.  No recent long distance traveling.  No tenderness in the calf area.  No nausea vomiting, diarrhea or abdominal pain.  No symptoms of UTI. Pt has CT chest without contrast recently which showed advanced chronic lung disease in the right upper lobe bronchiectasis. Pt was seen by Dr. Fernand again today, who  requested direct admission for further evaluation and treatment.   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  No oxygen desaturation. S/p Bronchodilators and prn Mucinex S/p Solu-Medrol 80 mg IV daily after given 125 mg of Solu-Medrol S/p azithromycin  500 mg x 1 dose followed by 250 mg p.o. daily for 4 days.  Continue Singulair  and Claritin  home dose. 11/11 patient's condition improved, denies any worsening of shortness of breath, feeling a lot better now, no wheezing on auscultation.  Transition to oral prednisone  tapering dose and continued azithromycin  on discharge.  Continued inhalers.  Recommended to follow-up with PCP and pulmonary in 1 week.   # Essential hypertension: Continued amlodipine  home dose # CAD (coronary artery disease): S/p of CABG. Continued aspirin , and Lipitor   # Paroxysmal atrial fibrillation, s/p Watchman device, not on anticoagulation.  Heart rate bradycardia, stable.  Continued aspirin    # Peripheral vascular disease: On Aspirin , Lipitor # HLD (hyperlipidemia): On Lipitor # Bilateral leg edema: Etiology is not clear.  BNP 17.9, not consistent with CHF.  Venous duplex negative for DVT. 11/11 edema resolved  BMI 28.43 Nutrition Interventions:  Patient was ambulatory without any assistance.  On the day of the discharge the patient's vitals were stable, and no other acute medical condition were reported by patient. the patient was felt safe to be discharge at Home.  Consultants: None Procedures: None  Discharge Exam: General: Appear in no distress, Oral Mucosa Clear, moist. Cardiovascular: S1  and S2 Present, no Murmur, Respiratory: normal respiratory effort, Bilateral Air entry present and no Crackles, no wheezes Abdomen: Bowel Sound present, Soft and no tenderness. Extremities: no Pedal edema, no calf tenderness Neurology: alert  and oriented to time, place, and person affect appropriate.  There were no vitals filed for this visit. Vitals:   07/23/24 0739 07/23/24 0803  BP:  135/75  Pulse:  75  Resp:  19  Temp:    SpO2: 95% 95%    DISCHARGE MEDICATION: Allergies as of 07/23/2024       Reactions   Budeson-glycopyrrol-formoterol Other (See Comments), Shortness Of Breath   cHEST AND SIDE PAIN   Benzonatate  Other (See Comments)   Stuffy nose; chills all over and constipation   Doxycycline  Other (See Comments)        Medication List     STOP taking these medications    levofloxacin  750 MG tablet Commonly known as: Levaquin    predniSONE  10 MG (21) Tbpk tablet Commonly known as: STERAPRED UNI-PAK 21 TAB Replaced by: predniSONE  20 MG tablet       TAKE these medications    albuterol  108 (90 Base) MCG/ACT inhaler Commonly known as: VENTOLIN  HFA Inhale 2 puffs into the lungs every 6 (six) hours as needed for wheezing or shortness of breath.   amLODipine  10 MG tablet Commonly known as: NORVASC  Take 10 mg by mouth daily.   ascorbic acid 500 MG tablet Commonly known as: VITAMIN C  Take 500 mg by mouth daily.   ASPIRIN  81 PO Take 81 mg by mouth. 1 daily   atorvastatin 20 MG tablet Commonly known as: LIPITOR Take 20 mg by mouth daily.   azithromycin  250 MG tablet Commonly known as: ZITHROMAX  Take 1 tablet (250 mg total) by mouth daily for 4 days. What changed:  how much to take how to take this when to take this additional instructions   benzonatate  200 MG capsule Commonly known as: TESSALON  Take 1 capsule (200 mg total) by mouth 2 (two) times daily as needed.   Calcium-Magnesium-Vitamin D 400-166.7-133.3 MG-MG-UNIT Tabs Take by mouth.   fluticasone  50 MCG/ACT nasal spray Commonly known as: FLONASE  SHAKE LIQUID AND USE 2 SPRAYS IN EACH NOSTRIL DAILY   fluticasone -salmeterol 250-50 MCG/ACT Aepb Commonly known as: ADVAIR Inhale 1 puff into the lungs in the morning and at  bedtime.   ipratropium-albuterol  0.5-2.5 (3) MG/3ML Soln Commonly known as: DUONEB Take 3 mLs by nebulization every 4 (four) hours as needed.   loratadine  10 MG tablet Commonly known as: CLARITIN  Take 1 tablet (10 mg total) by mouth daily.   lubiprostone  8 MCG capsule Commonly known as: AMITIZA  Take 1 capsule (8 mcg total) by mouth 2 (two) times daily with a meal.   montelukast  10 MG tablet Commonly known as: SINGULAIR  TAKE 1 TABLET BY MOUTH DAILY FOR CHRONIC ALLERGIC RHINITIS/ASTHMA   pantoprazole  40 MG tablet Commonly known as: PROTONIX  Take 1 tablet (40 mg total) by mouth daily.   potassium chloride  10 MEQ tablet Commonly known as: KLOR-CON  TAKE 1 TABLET BY MOUTH DAILY   predniSONE  20 MG tablet Commonly known as: DELTASONE  Take 2 tablets (40 mg total) by mouth daily with breakfast for 1 day, THEN 1.5 tablets (30 mg total) daily with breakfast for 1 day, THEN 1 tablet (20 mg total) daily with breakfast for 1 day, THEN 0.5 tablets (10 mg total) daily with breakfast for 1 day. Start taking on: July 24, 2024 Replaces: predniSONE  10 MG (21) Tbpk tablet   promethazine -codeine   6.25-10 MG/5ML syrup Commonly known as: PHENERGAN  with CODEINE  Take 5 mLs by mouth every 6 (six) hours as needed for cough.   triamcinolone  cream 0.1 % Commonly known as: KENALOG  Apply 1 Application topically 2 (two) times daily. To affected area until resolved.   vitamin E 1000 UNIT capsule Take 1,000 Units by mouth daily.       Allergies  Allergen Reactions   Budeson-Glycopyrrol-Formoterol Other (See Comments) and Shortness Of Breath    cHEST AND SIDE PAIN   Benzonatate  Other (See Comments)    Stuffy nose; chills all over and constipation   Doxycycline  Other (See Comments)   Discharge Instructions     Call MD for:  difficulty breathing, headache or visual disturbances   Complete by: As directed    Call MD for:  extreme fatigue   Complete by: As directed    Call MD for:  persistant  dizziness or light-headedness   Complete by: As directed    Call MD for:  persistant nausea and vomiting   Complete by: As directed    Call MD for:  severe uncontrolled pain   Complete by: As directed    Call MD for:  temperature >100.4   Complete by: As directed    Diet - low sodium heart healthy   Complete by: As directed    Discharge instructions   Complete by: As directed    Follow-up with PCP in 1 week Follow-up with pulmonary in 1 week   Increase activity slowly   Complete by: As directed        The results of significant diagnostics from this hospitalization (including imaging, microbiology, ancillary and laboratory) are listed below for reference.    Significant Diagnostic Studies: US  Venous Img Lower Bilateral (DVT) Result Date: 07/23/2024 CLINICAL DATA:  With leg pain. EXAM: Bilateral LOWER EXTREMITY VENOUS DOPPLER ULTRASOUND TECHNIQUE: Gray-scale sonography with compression, as well as color and duplex ultrasound, were performed to evaluate the deep venous system(s) from the level of the common femoral vein through the popliteal and proximal calf veins. COMPARISON:  None Available. FINDINGS: VENOUS Normal compressibility of the common femoral, superficial femoral, and popliteal veins, as well as the visualized calf veins. Visualized portions of profunda femoral vein and great saphenous vein unremarkable. No filling defects to suggest DVT on grayscale or color Doppler imaging. Doppler waveforms show normal direction of venous flow, normal respiratory plasticity and response to augmentation. Limited views of the contralateral common femoral vein are unremarkable. OTHER None. Limitations: none IMPRESSION: Negative. Electronically Signed   By: Cordella Banner   On: 07/23/2024 10:42   DG Chest Port 1 View Result Date: 07/22/2024 EXAM: 1 VIEW(S) XRAY OF THE CHEST 07/22/2024 09:52:00 PM COMPARISON: 05/14/2024 CLINICAL HISTORY: SOB (shortness of breath) FINDINGS: LUNGS AND PLEURA:  Right apical pleural scarring. No focal pulmonary opacity. No pleural effusion. No pneumothorax. HEART AND MEDIASTINUM: No acute abnormality of the cardiac and mediastinal silhouettes. BONES AND SOFT TISSUES: Postsurgical changes. No acute osseous abnormality. IMPRESSION: 1. No acute cardiopulmonary process. 2. Right apical pleural thickening, likely postsurgical/scar-related. Electronically signed by: Oneil Devonshire MD 07/22/2024 10:05 PM EST RP Workstation: [id number]   CT Chest Wo Contrast Result Date: 07/05/2024 EXAM: CT CHEST WITHOUT CONTRAST 07/05/2024 10:16:03 AM TECHNIQUE: CT of the chest was performed without the administration of intravenous contrast. Multiplanar reformatted images are provided for review. Automated exposure control, iterative reconstruction, and/or weight based adjustment of the mA/kV was utilized to reduce the radiation dose to as low as reasonably achievable.  COMPARISON: Chest CTA 02/21/2023. 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 FINDINGS: MEDIASTINUM: Stable heart size. No pericardial effusion. Left atrial occluder device. Major airways remain patent. Chronic traction on the trachea and thoracic esophagus. Tortuous thoracic aorta with calcified atherosclerosis. Vascular patency not evaluated in the absence of IV contrast. LYMPH NODES: No mediastinal, hilar or axillary lymphadenopathy. LUNGS AND PLEURA: Fairly advanced chronic right upper lobe lung disease with extensive bronchiectasis and architectural distortion stable from last year. Less pronounced bilateral middle lobe architectural distortion. Moderate chronic right costophrenic angle subpleural scarring and architectural distortion. Mild chronic lower lobe bronchiectasis, mild in comparison to the right upper lobe. Chronic left lower lobe lateral basal segment lung nodule on series 3 image 93 is stable since 2019 CTA and benign (no  follow up imaging recommended). No pleural effusion or active lung inflammation identified. No pneumothorax. SOFT TISSUES/BONES: Chronic sternotomy. CABG. Osteopenia. Stable mild T7 superior endplate compression. UPPER ABDOMEN: Small to moderate chronic hiatal hernia is stable. Otherwise negative visible noncontrast upper abdominal viscera. IMPRESSION: 1. Advanced chronic lung disease, especially right upper lobe bronchiectasis and architectural distortion. No superimposed acute or inflammatory process identified in the non-contrast chest. 2. Tortuous thoracic aorta with calcified atherosclerosis. Electronically signed by: Helayne Hurst MD 07/05/2024 10:39 AM EDT RP Workstation: [id number]    Microbiology: Recent Results (from the past 240 hours)  Resp panel by RT-PCR (RSV, Flu A&B, Covid) Anterior Nasal Swab     Status: None   Collection Time: 07/22/24  9:16 PM   Specimen: Anterior Nasal Swab  Result Value Ref Range Status   SARS Coronavirus 2 by RT PCR NEGATIVE NEGATIVE Final    Comment: (NOTE) SARS-CoV-2 target nucleic acids are NOT DETECTED.  The SARS-CoV-2 RNA is generally detectable in upper respiratory specimens during the acute phase of infection. The lowest concentration of SARS-CoV-2 viral copies this assay can detect is 138 copies/mL. A negative result does not preclude SARS-Cov-2 infection and should not be used as the sole basis for treatment or other patient management decisions. A negative result may occur with  improper specimen collection/handling, submission of specimen other than nasopharyngeal swab, presence of viral mutation(s) within the areas targeted by this assay, and inadequate number of viral copies(<138 copies/mL). A negative result must be combined with clinical observations, patient history, and epidemiological information. The expected result is Negative.  Fact Sheet for Patients:  bloggercourse.com  Fact Sheet for Healthcare  Providers:  seriousbroker.it  This test is no t yet approved or cleared by the [place]  FDA and  has been authorized for detection and/or diagnosis of SARS-CoV-2 by FDA under an Emergency Use Authorization (EUA). This EUA will remain  in effect (meaning this test can be used) for the duration of the COVID-19 declaration under Section 564(b)(1) of the Act, 21 U.S.C.section 360bbb-3(b)(1), unless the authorization is terminated  or revoked sooner.       Influenza A by PCR NEGATIVE NEGATIVE Final   Influenza B by PCR NEGATIVE NEGATIVE Final    Comment: (NOTE) The Xpert Xpress SARS-CoV-2/FLU/RSV plus assay is intended as an aid in the diagnosis of influenza from Nasopharyngeal swab specimens and should not be used as a sole basis for treatment. Nasal washings and aspirates are unacceptable for Xpert Xpress SARS-CoV-2/FLU/RSV testing.  Fact Sheet for Patients: bloggercourse.com  Fact Sheet for Healthcare Providers: seriousbroker.it  This test is not yet approved or cleared by the [place]  FDA and  has been authorized for detection and/or diagnosis of SARS-CoV-2 by FDA under an Emergency Use Authorization (EUA). This EUA will remain in effect (meaning this test can be used) for the duration of the COVID-19 declaration under Section 564(b)(1) of the Act, 21 U.S.C. section 360bbb-3(b)(1), unless the authorization is terminated or revoked.     Resp Syncytial Virus by PCR NEGATIVE NEGATIVE Final    Comment: (NOTE) Fact Sheet for Patients: bloggercourse.com  Fact Sheet for Healthcare Providers: seriousbroker.it  This test is not yet approved or cleared by the [place]  FDA and has been authorized for detection and/or diagnosis of SARS-CoV-2 by FDA under an Emergency Use Authorization (EUA). This EUA will remain in effect (meaning this test can be  used) for the duration of the COVID-19 declaration under Section 564(b)(1) of the Act, 21 U.S.C. section 360bbb-3(b)(1), unless the authorization is terminated or revoked.  Performed at [facility], [address] [place], [place] [address]      Labs: CBC: Recent Labs  Lab 07/22/24 2004 07/23/24 0512  WBC 12.6* 9.5  HGB 13.2 12.3*  HCT 41.3 38.6*  MCV 83.3 83.2  PLT 173 167   Basic Metabolic Panel: Recent Labs  Lab 07/22/24 2004 07/23/24 0512  NA 139 137  K 3.8 4.7  CL 103 103  CO2 26 23  GLUCOSE 147* 209*  BUN 31* 28*  CREATININE 1.19 0.98  CALCIUM 9.3 8.7*  MG  --  2.4  PHOS  --  3.5   Liver Function Tests: Recent Labs  Lab 07/23/24 0512  AST 20  ALT 18  ALKPHOS 43  BILITOT 0.9  PROT 5.8*  ALBUMIN 3.3*   No results for input(s): LIPASE, AMYLASE in the last 168 hours. No results for input(s): AMMONIA in the last 168 hours. Cardiac Enzymes: No results for input(s): CKTOTAL, CKMB, CKMBINDEX, TROPONINI in the last 168 hours. BNP (last 3 results) Recent Labs    07/22/24 2004  BNP 70.9   CBG: No results for input(s): GLUCAP in the last 168 hours.  Time spent: 35 minutes  Signed:  Elvan Sor  [facility] 07/23/2024  5:09 PM

## 2024-07-23 NOTE — TOC CM/SW Note (Signed)
 Transition of Care [facility]) CM/SW Note    Transition of Care [facility]) - Inpatient Brief Assessment   Patient Details  Name: James Mathews MRN: [id number] Date of Birth: 18-Sep-1940  [facility]) CM/SW Contact:    Alfonso Rummer, LCSW Phone Number: 07/23/2024, 10:33 AM   Clinical Narrative:  KEN DELENA Rummer completed TOC chart review. No TOC needs identified please contact should needs arise   Transition of Care Asessment: Insurance and Status: Insurance coverage has been reviewed Patient has primary care physician: Yes (ABERNATHY, ALYSSA) Home environment has been reviewed: single family home   Prior/Current Home Services: No current home services Social Drivers of Health Review: SDOH reviewed no interventions necessary Readmission risk has been reviewed: No Transition of care needs: no transition of care needs at this time

## 2024-07-23 NOTE — Care Management Obs Status (Signed)
 MEDICARE OBSERVATION STATUS NOTIFICATION   Patient Details  Name: James Mathews MRN: [id number] Date of Birth: [birth year]   Medicare Observation Status Notification Given:  Yes    James Mathews 07/23/2024, 11:37 AM

## 2024-07-25 ENCOUNTER — Encounter: Payer: Self-pay | Admitting: Nurse Practitioner

## 2024-07-25 ENCOUNTER — Telehealth: Payer: Self-pay | Admitting: Nurse Practitioner

## 2024-07-25 ENCOUNTER — Ambulatory Visit (INDEPENDENT_AMBULATORY_CARE_PROVIDER_SITE_OTHER): Admitting: Nurse Practitioner

## 2024-07-25 VITALS — BP 122/64 | HR 60 | Temp 96.2°F | Resp 16 | Ht 68.0 in | Wt 190.0 lb

## 2024-07-25 DIAGNOSIS — J441 Chronic obstructive pulmonary disease with (acute) exacerbation: Secondary | ICD-10-CM | POA: Diagnosis not present

## 2024-07-25 DIAGNOSIS — Z09 Encounter for follow-up examination after completed treatment for conditions other than malignant neoplasm: Secondary | ICD-10-CM

## 2024-07-25 DIAGNOSIS — H353221 Exudative age-related macular degeneration, left eye, with active choroidal neovascularization: Secondary | ICD-10-CM | POA: Diagnosis not present

## 2024-07-25 DIAGNOSIS — J479 Bronchiectasis, uncomplicated: Secondary | ICD-10-CM

## 2024-07-25 NOTE — Telephone Encounter (Signed)
 Notified Tammy, Stacy & Verneita w/ [facility] of percussion vest order-Toni

## 2024-07-25 NOTE — Progress Notes (Signed)
 [facility] [place], [place] [address] [place] [address] [phone]                                   Transitional Care Clinic   [facility] Discharge Acute Issues Care Follow Up                                                                        Patient Demographics  James Mathews, is a 83 y.o. male  DOB 08-27-1941  MRN [id number].  Primary MD  Liana Fish, NP  Admit date: 07/22/2024 Discharge date: 07/23/2024  Reason for TCC follow Up - acute exacerbation of COPD with asthma.    Past Medical History:  Diagnosis Date   Asthma    Atrial fibrillation (HCC)    Coronary artery disease    Hyperlipidemia    Hypertension     Past Surgical History:  Procedure Laterality Date   cartaract surgery both eye Bilateral    CORONARY ARTERY BYPASS GRAFT Bilateral    CYSTOSCOPY     HERNIA REPAIR     knee replacment Bilateral    LEFT ATRIAL APPENDAGE OCCLUSION  11/10/2021   retina surgery on right eye  Right 05/16/2017   TONSILLECTOMY Bilateral        Recent HPI and Hospital Course  Hospital Course:   James Mathews is a 83 y.o. male with medical history significant of COPD/asthma, HTN, HLD, CAD, s/p of CABG PAF not on anticoagulants, who presents with SOB.   Patient states that he has SOB intermittently for more than 2 months which has progressively worsening recently.  Patient has been following up with Dr. Fernand of pulmonology, and was treated with course of Levaquin  and prednisone .  He had some improvement initially, but then started SOB again which has been persistent.  He has cough with little white mucus production.  No chest pain, fever or chills.  He has bilateral lower leg edema, denies weight gain recently.  No recent long distance traveling.  No tenderness in the calf area.  No nausea vomiting, diarrhea or abdominal pain.  No symptoms of UTI. Pt has CT chest without contrast recently which showed advanced chronic lung  disease in the right upper lobe bronchiectasis. Pt was seen by Dr. Fernand again today, who requested direct admission for further evaluation and treatment.    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  No oxygen desaturation. S/p Bronchodilators and prn Mucinex S/p Solu-Medrol 80 mg IV daily after given 125 mg of Solu-Medrol S/p azithromycin  500 mg x 1 dose followed by 250 mg p.o. daily for 4 days.  Continue Singulair  and Claritin  home dose. 11/11 patient's condition improved, denies any worsening of shortness of breath, feeling a lot better now, no wheezing on auscultation.  Transition to oral prednisone  tapering dose and continued azithromycin  on discharge.  Continued inhalers.  Recommended to follow-up with PCP and pulmonary in 1 week.   # Essential hypertension: Continued amlodipine  home dose # CAD (coronary artery disease): S/p of CABG. Continued aspirin , and  Lipitor   # Paroxysmal atrial fibrillation, s/p Watchman device, not on anticoagulation.  Heart rate bradycardia, stable.  Continued aspirin    # Peripheral vascular disease: On Aspirin , Lipitor # HLD (hyperlipidemia): On Lipitor # Bilateral leg edema: Etiology is not clear.  BNP 17.9, not consistent with CHF.  Venous duplex negative for DVT. 11/11 edema resolved   BMI 28.43 Nutrition Interventions:   Patient was ambulatory without any assistance.   On the day of the discharge the patient's vitals were stable, and no other acute medical condition were reported by patient. the patient was felt safe to be discharge at Home.    Post Hospital Acute Care Issue to be followed in the Clinic   Acute exacerbation of COPD with asthma (HCC) Active Problems:   Bronchiectasis (HCC)   Essential hypertension   CAD (coronary artery disease)   Paroxysmal atrial fibrillation (HCC)   Peripheral vascular disease    HLD (hyperlipidemia)   Bilateral leg edema   Overweight (BMI 25.0-29.9)     Subjective:   James Mathews today has, No headache, No chest pain, No abdominal pain - No Nausea, No new weakness tingling or numbness, No Cough - SOB. Unable to take in a full breath  Assessment & Plan   1. Acute exacerbation of COPD with asthma (HCC) (Primary) Treated in hospital for acute copd exacerbation. Feeling somewhat better but still SOB and not able to take in a full breath.   2. Bronchiectasis without complication (HCC) Chest percussion vest prescribed  - For home use only DME Vest percussion  3. Hospital discharge follow-up Treated in hospital for copd exacerbation    Reason for frequent admissions/ER visits    COPD Heart disease bronchiectasis   Objective:   Vitals:   07/25/24 1145  BP: 122/64  Pulse: 60  Resp: 16  Temp: (!) 96.2 F (35.7 C)  SpO2: 94%  Weight: 190 lb (86.2 kg)  Height: 5' 8 (1.727 m)    Wt Readings from Last 3 Encounters:  07/25/24 190 lb (86.2 kg)  07/22/24 187 lb (84.8 kg)  07/08/24 195 lb (88.5 kg)    Allergies as of 07/25/2024       Reactions   Budeson-glycopyrrol-formoterol Other (See Comments), Shortness Of Breath   cHEST AND SIDE PAIN   Benzonatate  Other (See Comments)   Stuffy nose; chills all over and constipation   Doxycycline  Other (See Comments)        Medication List        Accurate as of July 25, 2024  3:15 PM. If you have any questions, ask your nurse or doctor.          albuterol  108 (90 Base) MCG/ACT inhaler Commonly known as: VENTOLIN  HFA Inhale 2 puffs into the lungs every 6 (six) hours as needed for wheezing or shortness of breath.   amLODipine  10 MG tablet Commonly known as: NORVASC  Take 10 mg by mouth daily.   ascorbic acid 500 MG tablet Commonly known as: VITAMIN C  Take 500 mg by mouth daily.   ASPIRIN  81 PO Take 81 mg by mouth. 1 daily   atorvastatin 20 MG tablet Commonly known as:  LIPITOR Take 20 mg by mouth daily.   azithromycin  250 MG tablet Commonly known as: ZITHROMAX  Take 1 tablet (250 mg total) by mouth daily for 4 days.   benzonatate  200 MG capsule Commonly known as: TESSALON  Take 1 capsule (200 mg total) by mouth 2 (two) times daily as needed.   Calcium-Magnesium-Vitamin D 400-166.7-133.3 MG-MG-UNIT Tabs  Take by mouth.   fluticasone  50 MCG/ACT nasal spray Commonly known as: FLONASE  SHAKE LIQUID AND USE 2 SPRAYS IN EACH NOSTRIL DAILY   fluticasone -salmeterol 250-50 MCG/ACT Aepb Commonly known as: ADVAIR Inhale 1 puff into the lungs in the morning and at bedtime.   ipratropium-albuterol  0.5-2.5 (3) MG/3ML Soln Commonly known as: DUONEB Take 3 mLs by nebulization every 4 (four) hours as needed.   loratadine  10 MG tablet Commonly known as: CLARITIN  Take 1 tablet (10 mg total) by mouth daily.   lubiprostone  8 MCG capsule Commonly known as: AMITIZA  Take 1 capsule (8 mcg total) by mouth 2 (two) times daily with a meal.   montelukast  10 MG tablet Commonly known as: SINGULAIR  TAKE 1 TABLET BY MOUTH DAILY FOR CHRONIC ALLERGIC RHINITIS/ASTHMA   pantoprazole  40 MG tablet Commonly known as: PROTONIX  Take 1 tablet (40 mg total) by mouth daily.   potassium chloride  10 MEQ tablet Commonly known as: KLOR-CON  TAKE 1 TABLET BY MOUTH DAILY   predniSONE  20 MG tablet Commonly known as: DELTASONE  Take 2 tablets (40 mg total) by mouth daily with breakfast for 1 day, THEN 1.5 tablets (30 mg total) daily with breakfast for 1 day, THEN 1 tablet (20 mg total) daily with breakfast for 1 day, THEN 0.5 tablets (10 mg total) daily with breakfast for 1 day. Start taking on: July 24, 2024   promethazine -codeine  6.25-10 MG/5ML syrup Commonly known as: PHENERGAN  with CODEINE  Take 5 mLs by mouth every 6 (six) hours as needed for cough.   triamcinolone  cream 0.1 % Commonly known as: KENALOG  Apply 1 Application topically 2 (two) times daily. To affected area  until resolved.   vitamin E 1000 UNIT capsule Take 1,000 Units by mouth daily.               Durable Medical Equipment  (From admission, onward)           Start     Ordered   07/25/24 0000  For home use only DME Vest percussion        07/25/24 1202             Physical Exam: Constitutional: Patient appears well-developed and well-nourished. Not in obvious distress. HENT: Normocephalic, atraumatic  Eyes: Conjunctivae and EOM are normal. PERRLA, no scleral icterus. CVS: RRR, S1/S2 +, no murmurs, no gallops, no carotid bruit.  Pulmonary: Effort and breath sounds normal, no stridor, rhonchi, wheezes, rales but does have bibasilar decreased breath sounds.   Neuro: Alert. Normal reflexes, muscle tone coordination. No cranial nerve deficit. Skin: Skin is warm and dry. No rash noted. Not diaphoretic. No erythema. No pallor. Psychiatric: Normal mood and affect. Behavior, judgment, thought content normal.   Data Review   Micro Results Recent Results (from the past 240 hours)  Resp panel by RT-PCR (RSV, Flu A&B, Covid) Anterior Nasal Swab     Status: None   Collection Time: 07/22/24  9:16 PM   Specimen: Anterior Nasal Swab  Result Value Ref Range Status   SARS Coronavirus 2 by RT PCR NEGATIVE NEGATIVE Final    Comment: (NOTE) SARS-CoV-2 target nucleic acids are NOT DETECTED.  The SARS-CoV-2 RNA is generally detectable in upper respiratory specimens during the acute phase of infection. The lowest concentration of SARS-CoV-2 viral copies this assay can detect is 138 copies/mL. A negative result does not preclude SARS-Cov-2 infection and should not be used as the sole basis for treatment or other patient management decisions. A negative result may occur with  improper specimen  collection/handling, submission of specimen other than nasopharyngeal swab, presence of viral mutation(s) within the areas targeted by this assay, and inadequate number of viral copies(<138  copies/mL). A negative result must be combined with clinical observations, patient history, and epidemiological information. The expected result is Negative.  Fact Sheet for Patients:  bloggercourse.com  Fact Sheet for Healthcare Providers:  seriousbroker.it  This test is no t yet approved or cleared by the [place]  FDA and  has been authorized for detection and/or diagnosis of SARS-CoV-2 by FDA under an Emergency Use Authorization (EUA). This EUA will remain  in effect (meaning this test can be used) for the duration of the COVID-19 declaration under Section 564(b)(1) of the Act, 21 U.S.C.section 360bbb-3(b)(1), unless the authorization is terminated  or revoked sooner.       Influenza A by PCR NEGATIVE NEGATIVE Final   Influenza B by PCR NEGATIVE NEGATIVE Final    Comment: (NOTE) The Xpert Xpress SARS-CoV-2/FLU/RSV plus assay is intended as an aid in the diagnosis of influenza from Nasopharyngeal swab specimens and should not be used as a sole basis for treatment. Nasal washings and aspirates are unacceptable for Xpert Xpress SARS-CoV-2/FLU/RSV testing.  Fact Sheet for Patients: bloggercourse.com  Fact Sheet for Healthcare Providers: seriousbroker.it  This test is not yet approved or cleared by the [place]  FDA and has been authorized for detection and/or diagnosis of SARS-CoV-2 by FDA under an Emergency Use Authorization (EUA). This EUA will remain in effect (meaning this test can be used) for the duration of the COVID-19 declaration under Section 564(b)(1) of the Act, 21 U.S.C. section 360bbb-3(b)(1), unless the authorization is terminated or revoked.     Resp Syncytial Virus by PCR NEGATIVE NEGATIVE Final    Comment: (NOTE) Fact Sheet for Patients: bloggercourse.com  Fact Sheet for Healthcare  Providers: seriousbroker.it  This test is not yet approved or cleared by the [place]  FDA and has been authorized for detection and/or diagnosis of SARS-CoV-2 by FDA under an Emergency Use Authorization (EUA). This EUA will remain in effect (meaning this test can be used) for the duration of the COVID-19 declaration under Section 564(b)(1) of the Act, 21 U.S.C. section 360bbb-3(b)(1), unless the authorization is terminated or revoked.  Performed at [facility], [address] [place], [place] [address]      CBC Recent Labs  Lab 07/22/24 2004 07/23/24 0512  WBC 12.6* 9.5  HGB 13.2 12.3*  HCT 41.3 38.6*  PLT 173 167  MCV 83.3 83.2  MCH 26.6 26.5  MCHC 32.0 31.9  RDW 18.4* 18.2*    Chemistries  Recent Labs  Lab 07/22/24 2004 07/23/24 0512  NA 139 137  K 3.8 4.7  CL 103 103  CO2 26 23  GLUCOSE 147* 209*  BUN 31* 28*  CREATININE 1.19 0.98  CALCIUM 9.3 8.7*  MG  --  2.4  AST  --  20  ALT  --  18  ALKPHOS  --  43  BILITOT  --  0.9   ------------------------------------------------------------------------------------------------------------------ estimated creatinine clearance is 59.9 mL/min (by C-G formula based on SCr of 0.98 mg/dL). ------------------------------------------------------------------------------------------------------------------ No results for input(s): HGBA1C in the last 72 hours. ------------------------------------------------------------------------------------------------------------------ No results for input(s): CHOL, HDL, LDLCALC, TRIG, CHOLHDL, LDLDIRECT in the last 72 hours. ------------------------------------------------------------------------------------------------------------------ No results for input(s): TSH, T4TOTAL, T3FREE, THYROIDAB in the last 72 hours.  Invalid input(s):  FREET3 ------------------------------------------------------------------------------------------------------------------ No results for input(s): VITAMINB12, FOLATE, FERRITIN, TIBC, IRON, RETICCTPCT in the last 72 hours.  Coagulation profile No results  for input(s): INR, PROTIME in the last 168 hours.  No results for input(s): DDIMER in the last 72 hours.  Cardiac Enzymes No results for input(s): CKMB, TROPONINI, MYOGLOBIN in the last 168 hours.  Invalid input(s): CK ------------------------------------------------------------------------------------------------------------------ Invalid input(s): POCBNP  Return for need f/u with DSK on mon or tues next week. .   Time Spent in minutes  45 Time spent with patient included reviewing progress notes, labs, imaging studies, and discussing plan for follow up.    This patient was seen by Mardy Maxin, FNP-C in collaboration with Dr. Sigrid Bathe as a part of collaborative care agreement.    Mardy Maxin MSN, FNP-C on 07/25/2024 at 11:40 AM   **Disclaimer: This note may have been dictated with voice recognition software. Similar sounding words can inadvertently be transcribed and this note may contain transcription errors which may not have been corrected upon publication of note.**

## 2024-07-30 ENCOUNTER — Encounter: Payer: Self-pay | Admitting: Internal Medicine

## 2024-07-30 ENCOUNTER — Ambulatory Visit (INDEPENDENT_AMBULATORY_CARE_PROVIDER_SITE_OTHER): Admitting: Internal Medicine

## 2024-07-30 ENCOUNTER — Telehealth: Payer: Self-pay

## 2024-07-30 ENCOUNTER — Telehealth: Payer: Self-pay | Admitting: Internal Medicine

## 2024-07-30 DIAGNOSIS — J441 Chronic obstructive pulmonary disease with (acute) exacerbation: Secondary | ICD-10-CM

## 2024-07-30 DIAGNOSIS — J479 Bronchiectasis, uncomplicated: Secondary | ICD-10-CM

## 2024-07-30 DIAGNOSIS — R0602 Shortness of breath: Secondary | ICD-10-CM

## 2024-07-30 MED ORDER — TIOTROPIUM BROMIDE-OLODATEROL 2.5-2.5 MCG/ACT IN AERS: 2.0000 | INHALATION_SPRAY | Freq: Every day | RESPIRATORY_TRACT | 4 refills | Status: AC

## 2024-07-30 MED ORDER — IPRATROPIUM-ALBUTEROL 0.5-2.5 (3) MG/3ML IN SOLN
3.0000 mL | RESPIRATORY_TRACT | 1 refills | Status: AC | PRN
Start: 2024-07-30 — End: ?

## 2024-07-30 MED ORDER — LEVOFLOXACIN 750 MG PO TABS: 750.0000 mg | ORAL_TABLET | Freq: Every day | ORAL | 0 refills | Status: AC

## 2024-07-30 NOTE — Telephone Encounter (Signed)
 Notified Tammy, Stacy & Verneita w/ [facility] of vest order-Toni

## 2024-07-30 NOTE — Telephone Encounter (Addendum)
 Completed P.A. for patient's Duoneb. Faxed appeal on 08/13/2024.

## 2024-07-30 NOTE — Progress Notes (Signed)
 [facility] [address] [place], [place] [address]  Pulmonary Sleep Medicine   Office Visit Note  Patient Name: James Mathews DOB: [birth year] MRN [id number]  Date of Service: 07/30/2024  Complaints/HPI: He was admitted for observation last week for COPD exacerbation. He states he has gotten a little better. He still has cough and sputum. He does not have a chest vest and this may help him to clear the mucus. Also may benefit from a flutter valve. He needs to continue with his steroids. He is currently on advair and albuterol . Will benefit from adding ipratropium  Office Spirometry Results:     ROS  General: (-) fever, (-) chills, (-) night sweats, (-) weakness Skin: (-) rashes, (-) itching,. Eyes: (-) visual changes, (-) redness, (-) itching. Nose and Sinuses: (-) nasal stuffiness or itchiness, (-) postnasal drip, (-) nosebleeds, (-) sinus trouble. Mouth and Throat: (-) sore throat, (-) hoarseness. Neck: (-) swollen glands, (-) enlarged thyroid , (-) neck pain. Respiratory: + cough, (-) bloody sputum, + shortness of breath, - wheezing. Cardiovascular: - ankle swelling, (-) chest pain. Lymphatic: (-) lymph node enlargement. Neurologic: (-) numbness, (-) tingling. Psychiatric: (-) anxiety, (-) depression   Current Medication: Outpatient Encounter Medications as of 07/30/2024  Medication Sig Note   albuterol  (VENTOLIN  HFA) 108 (90 Base) MCG/ACT inhaler Inhale 2 puffs into the lungs every 6 (six) hours as needed for wheezing or shortness of breath.    amLODipine  (NORVASC ) 10 MG tablet Take 10 mg by mouth daily.    ASPIRIN  81 PO Take 81 mg by mouth. 1 daily    atorvastatin (LIPITOR) 20 MG tablet Take 20 mg by mouth daily.    benzonatate  (TESSALON ) 200 MG capsule Take 1 capsule (200 mg total) by mouth 2 (two) times daily as needed. 07/23/2024: prn   Calcium-Magnesium-Vitamin D 400-166.7-133.3 MG-MG-UNIT TABS Take by mouth.    fluticasone  (FLONASE ) 50 MCG/ACT  nasal spray SHAKE LIQUID AND USE 2 SPRAYS IN EACH NOSTRIL DAILY    fluticasone -salmeterol (ADVAIR) 250-50 MCG/ACT AEPB Inhale 1 puff into the lungs in the morning and at bedtime.    ipratropium-albuterol  (DUONEB) 0.5-2.5 (3) MG/3ML SOLN Take 3 mLs by nebulization every 4 (four) hours as needed.    loratadine  (CLARITIN ) 10 MG tablet Take 1 tablet (10 mg total) by mouth daily.    lubiprostone  (AMITIZA ) 8 MCG capsule Take 1 capsule (8 mcg total) by mouth 2 (two) times daily with a meal.    montelukast  (SINGULAIR ) 10 MG tablet TAKE 1 TABLET BY MOUTH DAILY FOR CHRONIC ALLERGIC RHINITIS/ASTHMA    pantoprazole  (PROTONIX ) 40 MG tablet Take 1 tablet (40 mg total) by mouth daily.    potassium chloride  (KLOR-CON ) 10 MEQ tablet TAKE 1 TABLET BY MOUTH DAILY    promethazine -codeine  (PHENERGAN  WITH CODEINE ) 6.25-10 MG/5ML syrup Take 5 mLs by mouth every 6 (six) hours as needed for cough. 07/23/2024: prn   triamcinolone  cream (KENALOG ) 0.1 % Apply 1 Application topically 2 (two) times daily. To affected area until resolved.    vitamin C  (ASCORBIC ACID) 500 MG tablet Take 500 mg by mouth daily.    vitamin E 1000 UNIT capsule Take 1,000 Units by mouth daily.    No facility-administered encounter medications on file as of 07/30/2024.    Surgical History: Past Surgical History:  Procedure Laterality Date   cartaract surgery both eye Bilateral    CORONARY ARTERY BYPASS GRAFT Bilateral    CYSTOSCOPY     HERNIA REPAIR     knee replacment  Bilateral    LEFT ATRIAL APPENDAGE OCCLUSION  11/10/2021   retina surgery on right eye  Right 05/16/2017   TONSILLECTOMY Bilateral     Medical History: Past Medical History:  Diagnosis Date   Asthma    Atrial fibrillation (HCC)    Coronary artery disease    Hyperlipidemia    Hypertension     Family History: Family History  Problem Relation Age of Onset   Diabetes Mother    Bowel Disease Father    Alcohol abuse Father     Social History: Social History    Socioeconomic History   Marital status: Married    Spouse name: Not on file   Number of children: Not on file   Years of education: Not on file   Highest education level: Not on file  Occupational History   Not on file  Tobacco Use   Smoking status: Former    Passive exposure: Past   Smokeless tobacco: Never  Substance and Sexual Activity   Alcohol use: No   Drug use: No   Sexual activity: Not on file  Other Topics Concern   Not on file  Social History Narrative   Not on file   Social Drivers of Health   Financial Resource Strain: Low Risk  (03/23/2021)   Overall Financial Resource Strain (CARDIA)    Difficulty of Paying Living Expenses: Not very hard  Food Insecurity: No Food Insecurity (07/22/2024)   Hunger Vital Sign    Worried About Running Out of Food in the Last Year: Never true    Ran Out of Food in the Last Year: Never true  Transportation Needs: No Transportation Needs (07/22/2024)   PRAPARE - Administrator, Civil Service (Medical): No    Lack of Transportation (Non-Medical): No  Physical Activity: Not on file  Stress: Not on file  Social Connections: Moderately Isolated (07/22/2024)   Social Connection and Isolation Panel    Frequency of Communication with Friends and Family: Once a week    Frequency of Social Gatherings with Friends and Family: Once a week    Attends Religious Services: More than 4 times per year    Active Member of [employer] or Organizations: No    Attends Banker Meetings: Never    Marital Status: Married  Catering Manager Violence: Not At Risk (07/22/2024)   Humiliation, Afraid, Rape, and Kick questionnaire    Fear of Current or Ex-Partner: No    Emotionally Abused: No    Physically Abused: No    Sexually Abused: No    Vital Signs: Blood pressure 110/70, pulse 90, temperature 98 F (36.7 C), resp. rate 16, height 5' 8 (1.727 m), weight 185 lb (83.9 kg), SpO2 96%.  Examination: General Appearance: The  patient is well-developed, well-nourished, and in no distress. Skin: Gross inspection of skin unremarkable. Head: normocephalic, no gross deformities. Eyes: no gross deformities noted. ENT: ears appear grossly normal no exudates. Neck: Supple. No thyromegaly. No LAD. Respiratory: few rhonchi noted. Cardiovascular: Normal S1 and S2 without murmur or rub. Extremities: No cyanosis. pulses are equal. Neurologic: Alert and oriented. No involuntary movements.  LABS: Recent Results (from the past 2160 hours)  D-dimer, quantitative     Status: None   Collection Time: 07/12/24  8:58 AM  Result Value Ref Range   D-Dimer, Quant 0.40 0.00 - 0.50 ug/mL-FEU    Comment: (NOTE) At the manufacturer cut-off value of 0.5 g/mL FEU, this assay has a negative predictive value of 95-100%.This  assay is intended for use in conjunction with a clinical pretest probability (PTP) assessment model to exclude pulmonary embolism (PE) and deep venous thrombosis (DVT) in outpatients suspected of PE or DVT. Results should be correlated with clinical presentation. Performed at [facility], [address] [place], [place] [address]   Troponin I (High Sensitivity)     Status: None   Collection Time: 07/12/24  8:58 AM  Result Value Ref Range   Troponin I (High Sensitivity) 9 <18 ng/L    Comment: (NOTE) Elevated high sensitivity troponin I (hsTnI) values and significant  changes across serial measurements may suggest ACS but many other  chronic and acute conditions are known to elevate hsTnI results.  Refer to the Links section for chest pain algorithms and additional  guidance. Performed at [facility], [address] [place], [place] [address]   CBC     Status: Abnormal   Collection Time: 07/22/24  8:04 PM  Result Value Ref Range   WBC 12.6 (H) 4.0 - 10.5 K/uL   RBC 4.96 4.22 - 5.81 MIL/uL   Hemoglobin 13.2 13.0 - 17.0 g/dL   HCT 58.6 60.9 - 47.9 %   MCV 83.3 80.0 - 100.0 fL    MCH 26.6 26.0 - 34.0 pg   MCHC 32.0 30.0 - 36.0 g/dL   RDW 81.5 (H) 88.4 - 84.4 %   Platelets 173 150 - 400 K/uL   nRBC 0.0 0.0 - 0.2 %    Comment: Performed at [facility], [address], [place], [place] [address]  Basic metabolic panel with GFR     Status: Abnormal   Collection Time: 07/22/24  8:04 PM  Result Value Ref Range   Sodium 139 135 - 145 mmol/L   Potassium 3.8 3.5 - 5.1 mmol/L   Chloride 103 98 - 111 mmol/L   CO2 26 22 - 32 mmol/L   Glucose, Bld 147 (H) 70 - 99 mg/dL    Comment: Glucose reference range applies only to samples taken after fasting for at least 8 hours.   BUN 31 (H) 8 - 23 mg/dL   Creatinine, Ser 8.80 0.61 - 1.24 mg/dL   Calcium 9.3 8.9 - 89.6 mg/dL   GFR, Estimated >39 >39 mL/min    Comment: (NOTE) Calculated using the CKD-EPI Creatinine Equation (2021)    Anion gap 10 5 - 15    Comment: Performed at [facility], [address] [place], [place] [address]  Brain natriuretic peptide     Status: None   Collection Time: 07/22/24  8:04 PM  Result Value Ref Range   B Natriuretic Peptide 70.9 0.0 - 100.0 pg/mL    Comment: Performed at [facility], [address] [place], [place] [address]  Resp panel by RT-PCR (RSV, Flu A&B, Covid) Anterior Nasal Swab     Status: None   Collection Time: 07/22/24  9:16 PM   Specimen: Anterior Nasal Swab  Result Value Ref Range   SARS Coronavirus 2 by RT PCR NEGATIVE NEGATIVE    Comment: (NOTE) SARS-CoV-2 target nucleic acids are NOT DETECTED.  The SARS-CoV-2 RNA is generally detectable in upper respiratory specimens during the acute phase of infection. The lowest concentration of SARS-CoV-2 viral copies this assay can detect is 138 copies/mL. A negative result does not preclude SARS-Cov-2 infection and should not be used as the sole basis for treatment or other patient management decisions. A negative result may occur with  improper specimen collection/handling, submission of specimen  other than nasopharyngeal swab, presence of viral mutation(s) within the areas targeted by this assay, and inadequate number of viral copies(<138 copies/mL). A negative result must be combined with clinical observations, patient history, and epidemiological information. The expected result is Negative.  Fact Sheet for Patients:  bloggercourse.com  Fact Sheet for Healthcare Providers:  seriousbroker.it  This test is no t yet approved or cleared by the [place]  FDA and  has been authorized for detection and/or diagnosis of SARS-CoV-2 by FDA under an Emergency Use Authorization (EUA). This EUA will remain  in effect (meaning this test can be used) for the duration of the COVID-19 declaration under Section 564(b)(1) of the Act, 21 U.S.C.section 360bbb-3(b)(1), unless the authorization is terminated  or revoked sooner.       Influenza A by PCR NEGATIVE NEGATIVE   Influenza B by PCR NEGATIVE NEGATIVE    Comment: (NOTE) The Xpert Xpress SARS-CoV-2/FLU/RSV plus assay is intended as an aid in the diagnosis of influenza from Nasopharyngeal swab specimens and should not be used as a sole basis for treatment. Nasal washings and aspirates are unacceptable for Xpert Xpress SARS-CoV-2/FLU/RSV testing.  Fact Sheet for Patients: bloggercourse.com  Fact Sheet for Healthcare Providers: seriousbroker.it  This test is not yet approved or cleared by the [place]  FDA and has been authorized for detection and/or diagnosis of SARS-CoV-2 by FDA under an Emergency Use Authorization (EUA). This EUA will remain in effect (meaning this test can be used) for the duration of the COVID-19 declaration under Section 564(b)(1) of the Act, 21 U.S.C. section 360bbb-3(b)(1), unless the authorization is terminated or revoked.     Resp Syncytial Virus by PCR NEGATIVE NEGATIVE    Comment: (NOTE) Fact  Sheet for Patients: bloggercourse.com  Fact Sheet for Healthcare Providers: seriousbroker.it  This test is not yet approved or cleared by the [place]  FDA and has been authorized for detection and/or diagnosis of SARS-CoV-2 by FDA under an Emergency Use Authorization (EUA). This EUA will remain in effect (meaning this test can be used) for the duration of the COVID-19 declaration under Section 564(b)(1) of the Act, 21 U.S.C. section 360bbb-3(b)(1), unless the authorization is terminated or revoked.  Performed at [facility], [address] [place], [place] [address]   CBC     Status: Abnormal   Collection Time: 07/23/24  5:12 AM  Result Value Ref Range   WBC 9.5 4.0 - 10.5 K/uL   RBC 4.64 4.22 - 5.81 MIL/uL   Hemoglobin 12.3 (L) 13.0 - 17.0 g/dL   HCT 61.3 (L) 60.9 - 47.9 %   MCV 83.2 80.0 - 100.0 fL   MCH 26.5 26.0 - 34.0 pg   MCHC 31.9 30.0 - 36.0 g/dL   RDW 81.7 (H) 88.4 - 84.4 %   Platelets 167 150 - 400 K/uL   nRBC 0.0 0.0 - 0.2 %    Comment: Performed at [facility], [address] [place], [place] [address]  Hepatic function panel     Status: Abnormal   Collection Time: 07/23/24  5:12 AM  Result Value Ref Range   Total Protein 5.8 (L) 6.5 - 8.1 g/dL   Albumin 3.3 (L) 3.5 - 5.0 g/dL   AST 20 15 - 41 U/L   ALT 18 0 - 44 U/L   Alkaline Phosphatase 43 38 - 126 U/L   Total Bilirubin 0.9 0.0 - 1.2 mg/dL   Bilirubin, Direct 0.2 0.0 - 0.2 mg/dL   Indirect Bilirubin 0.7 0.3 - 0.9  mg/dL    Comment: Performed at [facility], [address] [place], [place] [address]  Basic metabolic panel with GFR     Status: Abnormal   Collection Time: 07/23/24  5:12 AM  Result Value Ref Range   Sodium 137 135 - 145 mmol/L   Potassium 4.7 3.5 - 5.1 mmol/L   Chloride 103 98 - 111 mmol/L   CO2 23 22 - 32 mmol/L   Glucose, Bld 209 (H) 70 - 99 mg/dL    Comment: Glucose reference range applies only to  samples taken after fasting for at least 8 hours.   BUN 28 (H) 8 - 23 mg/dL   Creatinine, Ser 9.01 0.61 - 1.24 mg/dL   Calcium 8.7 (L) 8.9 - 10.3 mg/dL   GFR, Estimated >39 >39 mL/min    Comment: (NOTE) Calculated using the CKD-EPI Creatinine Equation (2021)    Anion gap 11 5 - 15    Comment: Performed at [facility], [address] [place], [place] [address]  Magnesium     Status: None   Collection Time: 07/23/24  5:12 AM  Result Value Ref Range   Magnesium 2.4 1.7 - 2.4 mg/dL    Comment: Performed at [facility], [address], [place], [place] [address]  Phosphorus     Status: None   Collection Time: 07/23/24  5:12 AM  Result Value Ref Range   Phosphorus 3.5 2.5 - 4.6 mg/dL    Comment: Performed at [facility], [address] [place], [place] [address]    Radiology: US  Venous Img Lower Bilateral (DVT) Result Date: 07/23/2024 CLINICAL DATA:  With leg pain. EXAM: Bilateral LOWER EXTREMITY VENOUS DOPPLER ULTRASOUND TECHNIQUE: Gray-scale sonography with compression, as well as color and duplex ultrasound, were performed to evaluate the deep venous system(s) from the level of the common femoral vein through the popliteal and proximal calf veins. COMPARISON:  None Available. FINDINGS: VENOUS Normal compressibility of the common femoral, superficial femoral, and popliteal veins, as well as the visualized calf veins. Visualized portions of profunda femoral vein and great saphenous vein unremarkable. No filling defects to suggest DVT on grayscale or color Doppler imaging. Doppler waveforms show normal direction of venous flow, normal respiratory plasticity and response to augmentation. Limited views of the contralateral common femoral vein are unremarkable. OTHER None. Limitations: none IMPRESSION: Negative. Electronically Signed   By: Cordella Banner   On: 07/23/2024 10:42   DG Chest Port 1 View Result Date: 07/22/2024 EXAM: 1 VIEW(S) XRAY OF THE CHEST  07/22/2024 09:52:00 PM COMPARISON: 05/14/2024 CLINICAL HISTORY: SOB (shortness of breath) FINDINGS: LUNGS AND PLEURA: Right apical pleural scarring. No focal pulmonary opacity. No pleural effusion. No pneumothorax. HEART AND MEDIASTINUM: No acute abnormality of the cardiac and mediastinal silhouettes. BONES AND SOFT TISSUES: Postsurgical changes. No acute osseous abnormality. IMPRESSION: 1. No acute cardiopulmonary process. 2. Right apical pleural thickening, likely postsurgical/scar-related. Electronically signed by: Oneil Devonshire MD 07/22/2024 10:05 PM EST RP Workstation: [id number]    No results found.  US  Venous Img Lower Bilateral (DVT) Result Date: 07/23/2024 CLINICAL DATA:  With leg pain. EXAM: Bilateral LOWER EXTREMITY VENOUS DOPPLER ULTRASOUND TECHNIQUE: Gray-scale sonography with compression, as well as color and duplex ultrasound, were performed to evaluate the deep venous system(s) from the level of the common femoral vein through the popliteal and proximal calf veins. COMPARISON:  None Available. FINDINGS: VENOUS Normal compressibility of the common femoral, superficial femoral, and popliteal veins, as well as the visualized calf veins. Visualized  portions of profunda femoral vein and great saphenous vein unremarkable. No filling defects to suggest DVT on grayscale or color Doppler imaging. Doppler waveforms show normal direction of venous flow, normal respiratory plasticity and response to augmentation. Limited views of the contralateral common femoral vein are unremarkable. OTHER None. Limitations: none IMPRESSION: Negative. Electronically Signed   By: Cordella Banner   On: 07/23/2024 10:42   DG Chest Port 1 View Result Date: 07/22/2024 EXAM: 1 VIEW(S) XRAY OF THE CHEST 07/22/2024 09:52:00 PM COMPARISON: 05/14/2024 CLINICAL HISTORY: SOB (shortness of breath) FINDINGS: LUNGS AND PLEURA: Right apical pleural scarring. No focal pulmonary opacity. No pleural effusion. No pneumothorax. HEART AND  MEDIASTINUM: No acute abnormality of the cardiac and mediastinal silhouettes. BONES AND SOFT TISSUES: Postsurgical changes. No acute osseous abnormality. IMPRESSION: 1. No acute cardiopulmonary process. 2. Right apical pleural thickening, likely postsurgical/scar-related. Electronically signed by: Oneil Devonshire MD 07/22/2024 10:05 PM EST RP Workstation: [id number]   CT Chest Wo Contrast Result Date: 07/05/2024 EXAM: CT CHEST WITHOUT CONTRAST 07/05/2024 10:16:03 AM TECHNIQUE: CT of the chest was performed without the administration of intravenous contrast. Multiplanar reformatted images are provided for review. Automated exposure control, iterative reconstruction, and/or weight based adjustment of the mA/kV was utilized to reduce the radiation dose to as low as reasonably achievable. COMPARISON: Chest CTA 02/21/2023. 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 FINDINGS: MEDIASTINUM: Stable heart size. No pericardial effusion. Left atrial occluder device. Major airways remain patent. Chronic traction on the trachea and thoracic esophagus. Tortuous thoracic aorta with calcified atherosclerosis. Vascular patency not evaluated in the absence of IV contrast. LYMPH NODES: No mediastinal, hilar or axillary lymphadenopathy. LUNGS AND PLEURA: Fairly advanced chronic right upper lobe lung disease with extensive bronchiectasis and architectural distortion stable from last year. Less pronounced bilateral middle lobe architectural distortion. Moderate chronic right costophrenic angle subpleural scarring and architectural distortion. Mild chronic lower lobe bronchiectasis, mild in comparison to the right upper lobe. Chronic left lower lobe lateral basal segment lung nodule on series 3 image 93 is stable since 2019 CTA and benign (no follow up imaging recommended). No pleural effusion or active lung inflammation identified. No pneumothorax.  SOFT TISSUES/BONES: Chronic sternotomy. CABG. Osteopenia. Stable mild T7 superior endplate compression. UPPER ABDOMEN: Small to moderate chronic hiatal hernia is stable. Otherwise negative visible noncontrast upper abdominal viscera. IMPRESSION: 1. Advanced chronic lung disease, especially right upper lobe bronchiectasis and architectural distortion. No superimposed acute or inflammatory process identified in the non-contrast chest. 2. Tortuous thoracic aorta with calcified atherosclerosis. Electronically signed by: Helayne Hurst MD 07/05/2024 10:39 AM EDT RP Workstation: [id number]    Assessment and Plan: Patient Active Problem List   Diagnosis Date Noted   Overweight (BMI 25.0-29.9) 07/23/2024   HLD (hyperlipidemia) 07/22/2024   Bilateral leg edema 07/22/2024   Bronchiectasis (HCC) 07/22/2024   Acute exacerbation of COPD with asthma (HCC) 07/22/2024   Esophageal dysphagia 07/31/2020   Paroxysmal atrial fibrillation (HCC) 07/19/2020   Hypokalemia 07/24/2019   Acute non-recurrent frontal sinusitis 12/10/2018   Leg swelling 12/10/2018   Recurrent sinus infections 04/22/2018   Vitamin B12 deficiency 03/18/2018   Fatigue 02/26/2018   Dizziness 02/26/2018   Cough 01/02/2018   Shortness of breath 01/02/2018   Acute recurrent pansinusitis 11/23/2017   Gastroesophageal reflux disease without esophagitis 11/23/2017   Allergic rhinitis 11/23/2017   Mild intermittent asthma with acute exacerbation 11/23/2017   CAD (coronary artery disease) 09/28/2017   Mixed hyperlipidemia  09/28/2017   Bilateral carotid artery stenosis 06/25/2015   Essential hypertension 06/17/2015   Peripheral vascular disease 05/27/2014    1. Acute exacerbation of COPD with asthma (HCC) He is feeling a little bit better from the last time that I saw however he is still not quite back to baseline.  I did review his inhalers and I think his inhalers need to be optimized we will add DuoNebs and tiotropium - Tiotropium  Bromide-Olodaterol 2.5-2.5 MCG/ACT AERS; Inhale 2 puffs into the lungs daily.  Dispense: 1 each; Refill: 4  2. Bronchiectasis without complication [facility]) He has a great deal of difficulty getting the phlegm out with history of bronchiectasis the plan is going to be to try flutter valve chest PT and also given antibiotic course with Levaquin  - Flutter valve; Future - Chest physiotherapy; Future - levofloxacin  (LEVAQUIN ) 750 MG tablet; Take 1 tablet (750 mg total) by mouth daily.  Dispense: 7 tablet; Refill: 0  3. SOB (shortness of breath) As above we will give him nebulizers to improve his overall breathing - ipratropium-albuterol  (DUONEB) 0.5-2.5 (3) MG/3ML SOLN; Take 3 mLs by nebulization every 4 (four) hours as needed.  Dispense: 360 mL; Refill: 1   General Counseling: I have discussed the findings of the evaluation and examination with Mavis.  I have also discussed any further diagnostic evaluation thatmay be needed or ordered today. Antwain verbalizes understanding of the findings of todays visit. We also reviewed his medications today and discussed drug interactions and side effects including but not limited excessive drowsiness and altered mental states. We also discussed that there is always a risk not just to him but also people around him. he has been encouraged to call the office with any questions or concerns that should arise related to todays visit.  No orders of the defined types were placed in this encounter.    Time spent: 73  I have personally obtained a history, examined the patient, evaluated laboratory and imaging results, formulated the assessment and plan and placed orders.    Elfreda DELENA Bathe, MD [facility] Pulmonary and Critical Care Sleep medicine

## 2024-07-30 NOTE — Patient Instructions (Signed)

## 2024-08-16 ENCOUNTER — Telehealth: Payer: Self-pay | Admitting: Nurse Practitioner

## 2024-08-16 NOTE — Telephone Encounter (Signed)
 Patient to p/u flutter valve from [facility] today, for vest qualification-Toni

## 2024-08-19 ENCOUNTER — Ambulatory Visit: Admitting: Internal Medicine

## 2024-08-19 DIAGNOSIS — H353221 Exudative age-related macular degeneration, left eye, with active choroidal neovascularization: Secondary | ICD-10-CM | POA: Diagnosis not present

## 2024-08-19 DIAGNOSIS — H31012 Macula scars of posterior pole (postinflammatory) (post-traumatic), left eye: Secondary | ICD-10-CM | POA: Diagnosis not present

## 2024-08-21 ENCOUNTER — Other Ambulatory Visit: Payer: Self-pay | Admitting: Nurse Practitioner

## 2024-08-21 DIAGNOSIS — E876 Hypokalemia: Secondary | ICD-10-CM

## 2024-08-22 DIAGNOSIS — H353221 Exudative age-related macular degeneration, left eye, with active choroidal neovascularization: Secondary | ICD-10-CM | POA: Diagnosis not present

## 2024-09-10 ENCOUNTER — Other Ambulatory Visit (HOSPITAL_COMMUNITY): Payer: Self-pay

## 2024-09-16 ENCOUNTER — Ambulatory Visit: Admitting: Nurse Practitioner

## 2024-09-18 ENCOUNTER — Encounter: Payer: Self-pay | Admitting: Nurse Practitioner

## 2024-09-18 ENCOUNTER — Ambulatory Visit: Admitting: Nurse Practitioner

## 2024-09-18 VITALS — BP 128/72 | HR 68 | Temp 96.1°F | Resp 16 | Ht 68.0 in | Wt 180.8 lb

## 2024-09-18 DIAGNOSIS — R2689 Other abnormalities of gait and mobility: Secondary | ICD-10-CM | POA: Diagnosis not present

## 2024-09-18 DIAGNOSIS — M546 Pain in thoracic spine: Secondary | ICD-10-CM | POA: Diagnosis not present

## 2024-09-18 DIAGNOSIS — M545 Low back pain, unspecified: Secondary | ICD-10-CM

## 2024-09-18 DIAGNOSIS — S22000D Wedge compression fracture of unspecified thoracic vertebra, subsequent encounter for fracture with routine healing: Secondary | ICD-10-CM | POA: Insufficient documentation

## 2024-09-18 DIAGNOSIS — S22080D Wedge compression fracture of T11-T12 vertebra, subsequent encounter for fracture with routine healing: Secondary | ICD-10-CM

## 2024-09-18 MED ORDER — TRAMADOL HCL 50 MG PO TABS: 50.0000 mg | ORAL_TABLET | Freq: Four times a day (QID) | ORAL | 0 refills | Status: DC | PRN

## 2024-09-18 NOTE — Progress Notes (Signed)
 [facility] [address] [place], [place] [address]  Internal MEDICINE  Office Visit Note  Patient Name: James Mathews  [id number]  [id number]  Date of Service: 09/18/2024  Chief Complaint  Patient presents with   Acute Visit    Back injury      HPI Levin presents for an acute sick visit for a recent back injury --onset of back injury was on 08/31/24 when he picked up a car battery out of the trunk of the car and this caused a lot of pain. The pain became so severe that he went to urgent care on 09/04/24 and was told he had a compressed vertebrae.   The xray done at the [facility] clinic urgent care shows a compression fracture of the T11 vertebrae age undetermined.     Current Medication:  Outpatient Encounter Medications as of 09/18/2024  Medication Sig Note   traMADol  (ULTRAM ) 50 MG tablet Take 1 tablet (50 mg total) by mouth every 6 (six) hours as needed for up to 5 days for severe pain (pain score 7-10).    albuterol  (VENTOLIN  HFA) 108 (90 Base) MCG/ACT inhaler Inhale 2 puffs into the lungs every 6 (six) hours as needed for wheezing or shortness of breath.    amLODipine  (NORVASC ) 10 MG tablet Take 10 mg by mouth daily.    ASPIRIN  81 PO Take 81 mg by mouth. 1 daily    atorvastatin  (LIPITOR) 20 MG tablet Take 20 mg by mouth daily.    benzonatate  (TESSALON ) 200 MG capsule Take 1 capsule (200 mg total) by mouth 2 (two) times daily as needed. 07/23/2024: prn   Calcium -Magnesium-Vitamin D 400-166.7-133.3 MG-MG-UNIT TABS Take by mouth.    fluticasone  (FLONASE ) 50 MCG/ACT nasal spray SHAKE LIQUID AND USE 2 SPRAYS IN EACH NOSTRIL DAILY    fluticasone -salmeterol (ADVAIR) 250-50 MCG/ACT AEPB Inhale 1 puff into the lungs in the morning and at bedtime.    ipratropium-albuterol  (DUONEB) 0.5-2.5 (3) MG/3ML SOLN Take 3 mLs by nebulization every 4 (four) hours as needed.    levofloxacin  (LEVAQUIN ) 750 MG tablet Take 1 tablet (750 mg total) by mouth daily.    loratadine   (CLARITIN ) 10 MG tablet Take 1 tablet (10 mg total) by mouth daily.    lubiprostone  (AMITIZA ) 8 MCG capsule Take 1 capsule (8 mcg total) by mouth 2 (two) times daily with a meal.    montelukast  (SINGULAIR ) 10 MG tablet TAKE 1 TABLET BY MOUTH DAILY FOR CHRONIC ALLERGIC RHINITIS/ASTHMA    pantoprazole  (PROTONIX ) 40 MG tablet Take 1 tablet (40 mg total) by mouth daily.    potassium chloride  (KLOR-CON ) 10 MEQ tablet TAKE 1 TABLET BY MOUTH DAILY    promethazine -codeine  (PHENERGAN  WITH CODEINE ) 6.25-10 MG/5ML syrup Take 5 mLs by mouth every 6 (six) hours as needed for cough. 07/23/2024: prn   Tiotropium Bromide -Olodaterol 2.5-2.5 MCG/ACT AERS Inhale 2 puffs into the lungs daily.    triamcinolone  cream (KENALOG ) 0.1 % Apply 1 Application topically 2 (two) times daily. To affected area until resolved.    vitamin C  (ASCORBIC ACID) 500 MG tablet Take 500 mg by mouth daily.    vitamin E 1000 UNIT capsule Take 1,000 Units by mouth daily.    No facility-administered encounter medications on file as of 09/18/2024.      Medical History: Past Medical History:  Diagnosis Date   Asthma    Atrial fibrillation (HCC)    Coronary artery disease    Hyperlipidemia    Hypertension      Vital  Signs: BP 128/72   Pulse 68   Temp (!) 96.1 F (35.6 C)   Resp 16   Ht 5' 8 (1.727 m)   Wt 180 lb 12.8 oz (82 kg)   SpO2 95%   BMI 27.49 kg/m    Review of Systems  Constitutional:  Negative for chills, fatigue and unexpected weight change.  HENT:  Negative for congestion, postnasal drip, rhinorrhea, sneezing and sore throat.   Eyes:  Negative for redness.  Respiratory:  Negative for cough, chest tightness and shortness of breath.   Cardiovascular:  Negative for chest pain and palpitations.  Gastrointestinal:  Negative for abdominal pain, constipation, diarrhea, nausea and vomiting.  Genitourinary:  Negative for dysuria and frequency.  Musculoskeletal:  Positive for arthralgias, back pain, gait problem and  myalgias. Negative for joint swelling and neck pain.  Skin:  Negative for rash.  Neurological:  Positive for weakness. Negative for tremors and numbness.  Hematological:  Negative for adenopathy. Does not bruise/bleed easily.  Psychiatric/Behavioral:  Negative for behavioral problems (Depression), sleep disturbance and suicidal ideas. The patient is not nervous/anxious.     Physical Exam Vitals reviewed.  Constitutional:      General: He is not in acute distress.    Appearance: Normal appearance. He is not ill-appearing.  HENT:     Head: Normocephalic and atraumatic.  Eyes:     Pupils: Pupils are equal, round, and reactive to light.  Cardiovascular:     Rate and Rhythm: Normal rate and regular rhythm.     Heart sounds: Normal heart sounds. No murmur heard. Pulmonary:     Effort: Pulmonary effort is normal. No respiratory distress.     Breath sounds: Normal breath sounds. No wheezing.  Neurological:     Mental Status: He is alert and oriented to person, place, and time.  Psychiatric:        Mood and Affect: Mood normal.        Behavior: Behavior normal.       AssessmentPlan: 1. Thoracolumbar back pain (Primary) MRI of thoracic spine ordered and tramadol  prescribed for pain as needed. Consider neurosurgery referral pending MRI results.  - MR Thoracic Spine Wo Contrast; Future - traMADol  (ULTRAM ) 50 MG tablet; Take 1 tablet (50 mg total) by mouth every 6 (six) hours as needed for up to 5 days for severe pain (pain score 7-10).  Dispense: 20 tablet; Refill: 0  2. Compression fracture of T11 vertebra with routine healing, subsequent encounter MRI of thoracic spine ordered and tramadol  prescribed for pain as needed. Consider neurosurgery referral pending MRI results.  - MR Thoracic Spine Wo Contrast; Future - traMADol  (ULTRAM ) 50 MG tablet; Take 1 tablet (50 mg total) by mouth every 6 (six) hours as needed for up to 5 days for severe pain (pain score 7-10).  Dispense: 20 tablet;  Refill: 0  3. Impaired gait and mobility MRI of thoracic spine ordered and tramadol  prescribed for pain as needed. Consider neurosurgery referral pending MRI results.    General Counseling: flem enderle understanding of the findings of todays visit and agrees with plan of treatment. I have discussed any further diagnostic evaluation that may be needed or ordered today. We also reviewed his medications today. he has been encouraged to call the office with any questions or concerns that should arise related to todays visit.    Counseling:    Orders Placed This Encounter  Procedures   MR Thoracic Spine Wo Contrast    Meds ordered this encounter  Medications   traMADol  (ULTRAM ) 50 MG tablet    Sig: Take 1 tablet (50 mg total) by mouth every 6 (six) hours as needed for up to 5 days for severe pain (pain score 7-10).    Dispense:  20 tablet    Refill:  0    Fill new script today for acute pain    Return if symptoms worsen or fail to improve, for will plan referral order once results are posted for the MRI.SABRA  [place] Controlled Substance Database was reviewed by me for overdose risk score (ORS)  Time spent:30 Minutes Time spent with patient included reviewing progress notes, labs, imaging studies, and discussing plan for follow up.   This patient was seen by Mardy Maxin, FNP-C in collaboration with Dr. Sigrid Bathe as a part of collaborative care agreement.  Barnett Elzey R. Maxin, MSN, FNP-C Internal Medicine

## 2024-09-19 ENCOUNTER — Telehealth: Payer: Self-pay | Admitting: Nurse Practitioner

## 2024-09-19 ENCOUNTER — Encounter: Payer: Self-pay | Admitting: Nurse Practitioner

## 2024-09-19 NOTE — Telephone Encounter (Signed)
 Notified patient of MRI appointment date, arrival time, location-[place]

## 2024-09-20 ENCOUNTER — Telehealth: Payer: Self-pay | Admitting: Internal Medicine

## 2024-09-20 ENCOUNTER — Ambulatory Visit
Admission: RE | Admit: 2024-09-20 | Discharge: 2024-09-20 | Disposition: A | Source: Ambulatory Visit | Attending: Nurse Practitioner

## 2024-09-20 DIAGNOSIS — S22080D Wedge compression fracture of T11-T12 vertebra, subsequent encounter for fracture with routine healing: Secondary | ICD-10-CM | POA: Diagnosis present

## 2024-09-20 DIAGNOSIS — M545 Low back pain, unspecified: Secondary | ICD-10-CM | POA: Insufficient documentation

## 2024-09-20 DIAGNOSIS — M546 Pain in thoracic spine: Secondary | ICD-10-CM | POA: Insufficient documentation

## 2024-09-20 NOTE — Telephone Encounter (Signed)
 Per Glade with [facility], patient did not qualify for vest per flutter valve testing. I sent message to [facility]-[place]oni

## 2024-09-25 ENCOUNTER — Other Ambulatory Visit: Payer: Self-pay | Admitting: Nurse Practitioner

## 2024-09-25 ENCOUNTER — Telehealth: Payer: Self-pay

## 2024-09-25 DIAGNOSIS — M545 Low back pain, unspecified: Secondary | ICD-10-CM

## 2024-09-25 DIAGNOSIS — S22080D Wedge compression fracture of T11-T12 vertebra, subsequent encounter for fracture with routine healing: Secondary | ICD-10-CM

## 2024-09-25 MED ORDER — TRAMADOL HCL 50 MG PO TABS: 50.0000 mg | ORAL_TABLET | Freq: Three times a day (TID) | ORAL | 0 refills | Status: AC | PRN

## 2024-09-25 NOTE — Telephone Encounter (Signed)
 Pt notifies that we sent med

## 2024-09-29 ENCOUNTER — Ambulatory Visit: Payer: Self-pay | Admitting: Nurse Practitioner

## 2024-09-29 DIAGNOSIS — R2689 Other abnormalities of gait and mobility: Secondary | ICD-10-CM

## 2024-09-29 DIAGNOSIS — S22080D Wedge compression fracture of T11-T12 vertebra, subsequent encounter for fracture with routine healing: Secondary | ICD-10-CM

## 2024-09-29 DIAGNOSIS — M546 Pain in thoracic spine: Secondary | ICD-10-CM

## 2024-09-30 NOTE — Progress Notes (Signed)
 His MRI confirmed the compression fracture. I have sent an urgent referral to [place] [facility]. I can give him something stronger for pain but he cannot drive while taking it because anything stronger than what he is taking now will make him drowsy.

## 2024-10-02 ENCOUNTER — Encounter: Payer: Self-pay | Admitting: Nurse Practitioner

## 2024-10-03 ENCOUNTER — Ambulatory Visit: Admitting: Nurse Practitioner

## 2024-10-15 ENCOUNTER — Other Ambulatory Visit: Payer: Self-pay | Admitting: Nurse Practitioner

## 2024-10-15 DIAGNOSIS — J3089 Other allergic rhinitis: Secondary | ICD-10-CM

## 2024-10-28 ENCOUNTER — Ambulatory Visit: Admitting: Internal Medicine

## 2024-10-31 ENCOUNTER — Ambulatory Visit: Admitting: Nurse Practitioner

## 2025-03-18 ENCOUNTER — Ambulatory Visit: Admitting: Nurse Practitioner
